# Patient Record
Sex: Female | Born: 1960
Health system: Southern US, Community
[De-identification: ages and names within clinical notes are randomized; demographics above are authoritative.]

## PROBLEM LIST (undated history)

## (undated) DIAGNOSIS — R000 Tachycardia, unspecified: Secondary | ICD-10-CM

## (undated) DIAGNOSIS — N390 Urinary tract infection, site not specified: Secondary | ICD-10-CM

## (undated) DIAGNOSIS — I83813 Varicose veins of bilateral lower extremities with pain: Secondary | ICD-10-CM

## (undated) DIAGNOSIS — J45909 Unspecified asthma, uncomplicated: Secondary | ICD-10-CM

## (undated) DIAGNOSIS — D649 Anemia, unspecified: Secondary | ICD-10-CM

## (undated) DIAGNOSIS — M8580 Other specified disorders of bone density and structure, unspecified site: Secondary | ICD-10-CM

## (undated) DIAGNOSIS — M069 Rheumatoid arthritis, unspecified: Secondary | ICD-10-CM

## (undated) DIAGNOSIS — K529 Noninfective gastroenteritis and colitis, unspecified: Secondary | ICD-10-CM

## (undated) DIAGNOSIS — K219 Gastro-esophageal reflux disease without esophagitis: Secondary | ICD-10-CM

## (undated) DIAGNOSIS — K589 Irritable bowel syndrome without diarrhea: Secondary | ICD-10-CM

## (undated) DIAGNOSIS — T7840XA Allergy, unspecified, initial encounter: Secondary | ICD-10-CM

## (undated) DIAGNOSIS — F419 Anxiety disorder, unspecified: Secondary | ICD-10-CM

## (undated) DIAGNOSIS — M797 Fibromyalgia: Secondary | ICD-10-CM

## (undated) DIAGNOSIS — N3010 Interstitial cystitis (chronic) without hematuria: Secondary | ICD-10-CM

## (undated) DIAGNOSIS — R569 Unspecified convulsions: Secondary | ICD-10-CM

## (undated) DIAGNOSIS — F410 Panic disorder [episodic paroxysmal anxiety] without agoraphobia: Secondary | ICD-10-CM

## (undated) HISTORY — DX: Tachycardia, unspecified: R00.0

## (undated) HISTORY — DX: Panic disorder (episodic paroxysmal anxiety): F41.0

## (undated) HISTORY — DX: Unspecified convulsions: R56.9

## (undated) HISTORY — DX: Allergy, unspecified, initial encounter: T78.40XA

## (undated) HISTORY — DX: Varicose veins of bilateral lower extremities with pain: I83.813

## (undated) HISTORY — DX: Other specified disorders of bone density and structure, unspecified site: M85.80

## (undated) HISTORY — DX: Irritable bowel syndrome, unspecified: K58.9

## (undated) HISTORY — DX: Unspecified asthma, uncomplicated: J45.909

## (undated) HISTORY — DX: Interstitial cystitis (chronic) without hematuria: N30.10

## (undated) HISTORY — DX: Urinary tract infection, site not specified: N39.0

## (undated) HISTORY — DX: Rheumatoid arthritis, unspecified: M06.9

## (undated) HISTORY — DX: Noninfective gastroenteritis and colitis, unspecified: K52.9

## (undated) HISTORY — PX: TUBAL LIGATION: SHX77

## (undated) HISTORY — DX: Fibromyalgia: M79.7

## (undated) HISTORY — DX: Anxiety disorder, unspecified: F41.9

## (undated) HISTORY — DX: Gastro-esophageal reflux disease without esophagitis: K21.9

## (undated) HISTORY — DX: Anemia, unspecified: D64.9

---

## 1993-05-24 HISTORY — PX: COLONOSCOPY: SHX174

## 2010-11-17 DIAGNOSIS — F39 Unspecified mood [affective] disorder: Secondary | ICD-10-CM | POA: Insufficient documentation

## 2010-11-17 DIAGNOSIS — F411 Generalized anxiety disorder: Secondary | ICD-10-CM | POA: Insufficient documentation

## 2015-12-25 DIAGNOSIS — R002 Palpitations: Secondary | ICD-10-CM | POA: Insufficient documentation

## 2015-12-25 DIAGNOSIS — D649 Anemia, unspecified: Secondary | ICD-10-CM | POA: Insufficient documentation

## 2016-03-08 DIAGNOSIS — F419 Anxiety disorder, unspecified: Secondary | ICD-10-CM | POA: Insufficient documentation

## 2016-04-26 HISTORY — PX: LAPAROSCOPIC HYSTERECTOMY: SHX1926

## 2016-04-26 HISTORY — PX: ABDOMINAL HYSTERECTOMY: SHX81

## 2016-05-04 ENCOUNTER — Encounter: Payer: Self-pay | Admitting: Vascular Surgery

## 2016-05-14 ENCOUNTER — Encounter: Payer: Self-pay | Admitting: Vascular Surgery

## 2016-05-31 ENCOUNTER — Encounter: Payer: Self-pay | Admitting: Vascular Surgery

## 2016-06-08 DIAGNOSIS — F410 Panic disorder [episodic paroxysmal anxiety] without agoraphobia: Secondary | ICD-10-CM | POA: Diagnosis not present

## 2016-06-23 DIAGNOSIS — R928 Other abnormal and inconclusive findings on diagnostic imaging of breast: Secondary | ICD-10-CM | POA: Diagnosis not present

## 2016-06-23 DIAGNOSIS — R922 Inconclusive mammogram: Secondary | ICD-10-CM | POA: Diagnosis not present

## 2016-06-27 DIAGNOSIS — R112 Nausea with vomiting, unspecified: Secondary | ICD-10-CM | POA: Diagnosis not present

## 2016-06-27 DIAGNOSIS — K589 Irritable bowel syndrome without diarrhea: Secondary | ICD-10-CM | POA: Diagnosis not present

## 2016-06-27 DIAGNOSIS — E872 Acidosis: Secondary | ICD-10-CM | POA: Diagnosis not present

## 2016-06-27 DIAGNOSIS — D61818 Other pancytopenia: Secondary | ICD-10-CM | POA: Diagnosis not present

## 2016-06-27 DIAGNOSIS — F419 Anxiety disorder, unspecified: Secondary | ICD-10-CM | POA: Diagnosis not present

## 2016-06-27 DIAGNOSIS — R000 Tachycardia, unspecified: Secondary | ICD-10-CM | POA: Diagnosis not present

## 2016-06-27 DIAGNOSIS — F411 Generalized anxiety disorder: Secondary | ICD-10-CM | POA: Diagnosis present

## 2016-06-27 DIAGNOSIS — K529 Noninfective gastroenteritis and colitis, unspecified: Secondary | ICD-10-CM | POA: Diagnosis not present

## 2016-06-27 DIAGNOSIS — K625 Hemorrhage of anus and rectum: Secondary | ICD-10-CM | POA: Diagnosis not present

## 2016-06-27 DIAGNOSIS — A419 Sepsis, unspecified organism: Secondary | ICD-10-CM | POA: Diagnosis not present

## 2016-06-27 DIAGNOSIS — Z79899 Other long term (current) drug therapy: Secondary | ICD-10-CM | POA: Diagnosis not present

## 2016-06-27 DIAGNOSIS — R1084 Generalized abdominal pain: Secondary | ICD-10-CM | POA: Diagnosis not present

## 2016-06-27 DIAGNOSIS — R109 Unspecified abdominal pain: Secondary | ICD-10-CM | POA: Diagnosis not present

## 2016-06-28 DIAGNOSIS — K529 Noninfective gastroenteritis and colitis, unspecified: Secondary | ICD-10-CM | POA: Diagnosis not present

## 2016-06-28 DIAGNOSIS — R112 Nausea with vomiting, unspecified: Secondary | ICD-10-CM | POA: Diagnosis not present

## 2016-06-28 DIAGNOSIS — F419 Anxiety disorder, unspecified: Secondary | ICD-10-CM | POA: Diagnosis not present

## 2016-06-29 DIAGNOSIS — R112 Nausea with vomiting, unspecified: Secondary | ICD-10-CM | POA: Diagnosis not present

## 2016-06-29 DIAGNOSIS — F419 Anxiety disorder, unspecified: Secondary | ICD-10-CM | POA: Diagnosis not present

## 2016-06-29 DIAGNOSIS — K529 Noninfective gastroenteritis and colitis, unspecified: Secondary | ICD-10-CM | POA: Diagnosis not present

## 2016-06-30 DIAGNOSIS — K529 Noninfective gastroenteritis and colitis, unspecified: Secondary | ICD-10-CM | POA: Diagnosis not present

## 2016-06-30 DIAGNOSIS — R112 Nausea with vomiting, unspecified: Secondary | ICD-10-CM | POA: Diagnosis not present

## 2016-06-30 DIAGNOSIS — F419 Anxiety disorder, unspecified: Secondary | ICD-10-CM | POA: Diagnosis not present

## 2016-07-05 DIAGNOSIS — I951 Orthostatic hypotension: Secondary | ICD-10-CM | POA: Diagnosis not present

## 2016-07-05 DIAGNOSIS — A09 Infectious gastroenteritis and colitis, unspecified: Secondary | ICD-10-CM | POA: Diagnosis not present

## 2016-07-05 DIAGNOSIS — R8299 Other abnormal findings in urine: Secondary | ICD-10-CM | POA: Diagnosis not present

## 2016-07-06 DIAGNOSIS — R1031 Right lower quadrant pain: Secondary | ICD-10-CM | POA: Diagnosis not present

## 2016-07-06 DIAGNOSIS — K529 Noninfective gastroenteritis and colitis, unspecified: Secondary | ICD-10-CM | POA: Diagnosis not present

## 2016-07-21 DIAGNOSIS — R1010 Upper abdominal pain, unspecified: Secondary | ICD-10-CM | POA: Diagnosis not present

## 2016-07-21 DIAGNOSIS — K5909 Other constipation: Secondary | ICD-10-CM | POA: Diagnosis not present

## 2016-07-21 DIAGNOSIS — K529 Noninfective gastroenteritis and colitis, unspecified: Secondary | ICD-10-CM | POA: Diagnosis not present

## 2016-07-27 ENCOUNTER — Encounter: Payer: Self-pay | Admitting: Vascular Surgery

## 2016-07-27 DIAGNOSIS — M5432 Sciatica, left side: Secondary | ICD-10-CM | POA: Diagnosis not present

## 2016-07-27 DIAGNOSIS — K921 Melena: Secondary | ICD-10-CM | POA: Diagnosis not present

## 2016-07-27 DIAGNOSIS — R0789 Other chest pain: Secondary | ICD-10-CM | POA: Diagnosis not present

## 2016-07-27 DIAGNOSIS — R1010 Upper abdominal pain, unspecified: Secondary | ICD-10-CM | POA: Diagnosis not present

## 2016-07-27 DIAGNOSIS — R350 Frequency of micturition: Secondary | ICD-10-CM | POA: Diagnosis not present

## 2016-07-27 DIAGNOSIS — M791 Myalgia: Secondary | ICD-10-CM | POA: Diagnosis not present

## 2016-07-27 DIAGNOSIS — R799 Abnormal finding of blood chemistry, unspecified: Secondary | ICD-10-CM | POA: Diagnosis not present

## 2016-08-02 DIAGNOSIS — R799 Abnormal finding of blood chemistry, unspecified: Secondary | ICD-10-CM | POA: Diagnosis not present

## 2016-08-02 DIAGNOSIS — R760 Raised antibody titer: Secondary | ICD-10-CM | POA: Diagnosis not present

## 2016-08-02 DIAGNOSIS — M791 Myalgia: Secondary | ICD-10-CM | POA: Diagnosis not present

## 2016-08-05 ENCOUNTER — Encounter: Payer: Self-pay | Admitting: Vascular Surgery

## 2016-08-17 ENCOUNTER — Encounter: Payer: Medicare Other | Admitting: Vascular Surgery

## 2016-08-25 DIAGNOSIS — R079 Chest pain, unspecified: Secondary | ICD-10-CM | POA: Diagnosis not present

## 2016-08-25 DIAGNOSIS — R0602 Shortness of breath: Secondary | ICD-10-CM | POA: Diagnosis not present

## 2016-08-26 DIAGNOSIS — R079 Chest pain, unspecified: Secondary | ICD-10-CM | POA: Diagnosis not present

## 2016-09-07 DIAGNOSIS — R0602 Shortness of breath: Secondary | ICD-10-CM | POA: Diagnosis not present

## 2016-09-07 DIAGNOSIS — M791 Myalgia: Secondary | ICD-10-CM | POA: Diagnosis not present

## 2016-09-07 DIAGNOSIS — F329 Major depressive disorder, single episode, unspecified: Secondary | ICD-10-CM | POA: Diagnosis not present

## 2016-09-07 DIAGNOSIS — F419 Anxiety disorder, unspecified: Secondary | ICD-10-CM | POA: Diagnosis not present

## 2016-09-13 DIAGNOSIS — M5432 Sciatica, left side: Secondary | ICD-10-CM | POA: Diagnosis not present

## 2016-09-14 DIAGNOSIS — R05 Cough: Secondary | ICD-10-CM | POA: Diagnosis not present

## 2016-09-14 DIAGNOSIS — R0602 Shortness of breath: Secondary | ICD-10-CM | POA: Diagnosis not present

## 2016-09-29 DIAGNOSIS — Z1272 Encounter for screening for malignant neoplasm of vagina: Secondary | ICD-10-CM | POA: Diagnosis not present

## 2016-09-29 DIAGNOSIS — R102 Pelvic and perineal pain: Secondary | ICD-10-CM | POA: Diagnosis not present

## 2016-09-29 DIAGNOSIS — Z01411 Encounter for gynecological examination (general) (routine) with abnormal findings: Secondary | ICD-10-CM | POA: Diagnosis not present

## 2016-09-29 DIAGNOSIS — R8299 Other abnormal findings in urine: Secondary | ICD-10-CM | POA: Diagnosis not present

## 2016-10-06 DIAGNOSIS — J301 Allergic rhinitis due to pollen: Secondary | ICD-10-CM | POA: Diagnosis not present

## 2016-10-06 DIAGNOSIS — R5383 Other fatigue: Secondary | ICD-10-CM | POA: Diagnosis not present

## 2016-10-06 DIAGNOSIS — E559 Vitamin D deficiency, unspecified: Secondary | ICD-10-CM | POA: Diagnosis not present

## 2016-10-06 DIAGNOSIS — G4733 Obstructive sleep apnea (adult) (pediatric): Secondary | ICD-10-CM | POA: Diagnosis not present

## 2016-10-06 DIAGNOSIS — J4540 Moderate persistent asthma, uncomplicated: Secondary | ICD-10-CM | POA: Diagnosis not present

## 2016-10-18 DIAGNOSIS — I517 Cardiomegaly: Secondary | ICD-10-CM | POA: Diagnosis not present

## 2016-10-18 DIAGNOSIS — R51 Headache: Secondary | ICD-10-CM | POA: Diagnosis not present

## 2016-10-19 DIAGNOSIS — B86 Scabies: Secondary | ICD-10-CM | POA: Diagnosis not present

## 2016-10-19 DIAGNOSIS — N760 Acute vaginitis: Secondary | ICD-10-CM | POA: Diagnosis not present

## 2016-11-01 DIAGNOSIS — R102 Pelvic and perineal pain: Secondary | ICD-10-CM | POA: Diagnosis not present

## 2016-12-02 DIAGNOSIS — R8299 Other abnormal findings in urine: Secondary | ICD-10-CM | POA: Diagnosis not present

## 2016-12-13 DIAGNOSIS — Z1389 Encounter for screening for other disorder: Secondary | ICD-10-CM | POA: Diagnosis not present

## 2016-12-13 DIAGNOSIS — F3289 Other specified depressive episodes: Secondary | ICD-10-CM | POA: Diagnosis not present

## 2016-12-13 DIAGNOSIS — R1084 Generalized abdominal pain: Secondary | ICD-10-CM | POA: Diagnosis not present

## 2016-12-13 DIAGNOSIS — F319 Bipolar disorder, unspecified: Secondary | ICD-10-CM | POA: Diagnosis not present

## 2016-12-13 DIAGNOSIS — Z136 Encounter for screening for cardiovascular disorders: Secondary | ICD-10-CM | POA: Diagnosis not present

## 2016-12-13 DIAGNOSIS — F3160 Bipolar disorder, current episode mixed, unspecified: Secondary | ICD-10-CM | POA: Diagnosis not present

## 2016-12-13 DIAGNOSIS — F339 Major depressive disorder, recurrent, unspecified: Secondary | ICD-10-CM | POA: Diagnosis not present

## 2016-12-13 DIAGNOSIS — F419 Anxiety disorder, unspecified: Secondary | ICD-10-CM | POA: Diagnosis not present

## 2016-12-16 DIAGNOSIS — R799 Abnormal finding of blood chemistry, unspecified: Secondary | ICD-10-CM | POA: Diagnosis not present

## 2017-01-04 DIAGNOSIS — Z1211 Encounter for screening for malignant neoplasm of colon: Secondary | ICD-10-CM | POA: Diagnosis not present

## 2017-01-04 DIAGNOSIS — R1030 Lower abdominal pain, unspecified: Secondary | ICD-10-CM | POA: Diagnosis not present

## 2017-01-04 DIAGNOSIS — K589 Irritable bowel syndrome without diarrhea: Secondary | ICD-10-CM | POA: Diagnosis not present

## 2017-01-14 DIAGNOSIS — H5710 Ocular pain, unspecified eye: Secondary | ICD-10-CM | POA: Diagnosis not present

## 2017-01-20 DIAGNOSIS — R1010 Upper abdominal pain, unspecified: Secondary | ICD-10-CM | POA: Diagnosis not present

## 2017-01-20 DIAGNOSIS — R1030 Lower abdominal pain, unspecified: Secondary | ICD-10-CM | POA: Diagnosis not present

## 2017-01-25 DIAGNOSIS — K529 Noninfective gastroenteritis and colitis, unspecified: Secondary | ICD-10-CM | POA: Diagnosis not present

## 2017-01-25 DIAGNOSIS — R197 Diarrhea, unspecified: Secondary | ICD-10-CM | POA: Diagnosis not present

## 2017-01-25 DIAGNOSIS — K589 Irritable bowel syndrome without diarrhea: Secondary | ICD-10-CM | POA: Diagnosis not present

## 2017-01-26 DIAGNOSIS — R197 Diarrhea, unspecified: Secondary | ICD-10-CM | POA: Diagnosis not present

## 2017-02-24 DIAGNOSIS — N3000 Acute cystitis without hematuria: Secondary | ICD-10-CM | POA: Diagnosis not present

## 2017-02-24 DIAGNOSIS — K760 Fatty (change of) liver, not elsewhere classified: Secondary | ICD-10-CM | POA: Diagnosis not present

## 2017-02-24 DIAGNOSIS — R1032 Left lower quadrant pain: Secondary | ICD-10-CM | POA: Diagnosis not present

## 2017-03-15 DIAGNOSIS — Z1159 Encounter for screening for other viral diseases: Secondary | ICD-10-CM | POA: Diagnosis not present

## 2017-03-15 DIAGNOSIS — R079 Chest pain, unspecified: Secondary | ICD-10-CM | POA: Diagnosis not present

## 2017-03-15 DIAGNOSIS — R000 Tachycardia, unspecified: Secondary | ICD-10-CM | POA: Diagnosis not present

## 2017-03-15 DIAGNOSIS — A09 Infectious gastroenteritis and colitis, unspecified: Secondary | ICD-10-CM | POA: Diagnosis not present

## 2017-03-15 DIAGNOSIS — R51 Headache: Secondary | ICD-10-CM | POA: Diagnosis not present

## 2017-03-15 DIAGNOSIS — M791 Myalgia, unspecified site: Secondary | ICD-10-CM | POA: Diagnosis not present

## 2017-03-15 DIAGNOSIS — Z0000 Encounter for general adult medical examination without abnormal findings: Secondary | ICD-10-CM | POA: Diagnosis not present

## 2017-03-15 DIAGNOSIS — R828 Abnormal findings on cytological and histological examination of urine: Secondary | ICD-10-CM | POA: Diagnosis not present

## 2017-03-15 DIAGNOSIS — R300 Dysuria: Secondary | ICD-10-CM | POA: Diagnosis not present

## 2017-03-15 DIAGNOSIS — R5383 Other fatigue: Secondary | ICD-10-CM | POA: Diagnosis not present

## 2017-03-15 DIAGNOSIS — R002 Palpitations: Secondary | ICD-10-CM | POA: Diagnosis not present

## 2017-03-15 DIAGNOSIS — R0602 Shortness of breath: Secondary | ICD-10-CM | POA: Diagnosis not present

## 2017-03-15 DIAGNOSIS — R1010 Upper abdominal pain, unspecified: Secondary | ICD-10-CM | POA: Diagnosis not present

## 2017-03-15 DIAGNOSIS — F419 Anxiety disorder, unspecified: Secondary | ICD-10-CM | POA: Diagnosis not present

## 2017-03-28 DIAGNOSIS — F419 Anxiety disorder, unspecified: Secondary | ICD-10-CM | POA: Diagnosis not present

## 2017-03-28 DIAGNOSIS — A419 Sepsis, unspecified organism: Secondary | ICD-10-CM | POA: Diagnosis not present

## 2017-03-28 DIAGNOSIS — R109 Unspecified abdominal pain: Secondary | ICD-10-CM | POA: Diagnosis not present

## 2017-03-28 DIAGNOSIS — R0602 Shortness of breath: Secondary | ICD-10-CM | POA: Diagnosis not present

## 2017-03-28 DIAGNOSIS — R404 Transient alteration of awareness: Secondary | ICD-10-CM | POA: Diagnosis not present

## 2017-03-28 DIAGNOSIS — R064 Hyperventilation: Secondary | ICD-10-CM | POA: Diagnosis not present

## 2017-03-28 DIAGNOSIS — F418 Other specified anxiety disorders: Secondary | ICD-10-CM | POA: Diagnosis not present

## 2017-03-28 DIAGNOSIS — R531 Weakness: Secondary | ICD-10-CM | POA: Diagnosis not present

## 2017-04-20 DIAGNOSIS — R0602 Shortness of breath: Secondary | ICD-10-CM | POA: Diagnosis not present

## 2017-04-20 DIAGNOSIS — Z1379 Encounter for other screening for genetic and chromosomal anomalies: Secondary | ICD-10-CM | POA: Diagnosis not present

## 2017-04-20 DIAGNOSIS — Z8041 Family history of malignant neoplasm of ovary: Secondary | ICD-10-CM | POA: Diagnosis not present

## 2017-04-20 DIAGNOSIS — R000 Tachycardia, unspecified: Secondary | ICD-10-CM | POA: Diagnosis not present

## 2017-04-20 DIAGNOSIS — D219 Benign neoplasm of connective and other soft tissue, unspecified: Secondary | ICD-10-CM | POA: Diagnosis not present

## 2017-04-20 DIAGNOSIS — R5383 Other fatigue: Secondary | ICD-10-CM | POA: Diagnosis not present

## 2017-04-20 DIAGNOSIS — D649 Anemia, unspecified: Secondary | ICD-10-CM | POA: Diagnosis not present

## 2017-04-20 DIAGNOSIS — F419 Anxiety disorder, unspecified: Secondary | ICD-10-CM | POA: Diagnosis not present

## 2017-04-20 DIAGNOSIS — D509 Iron deficiency anemia, unspecified: Secondary | ICD-10-CM | POA: Diagnosis not present

## 2017-04-20 DIAGNOSIS — Z800 Family history of malignant neoplasm of digestive organs: Secondary | ICD-10-CM | POA: Diagnosis not present

## 2017-04-20 DIAGNOSIS — Z808 Family history of malignant neoplasm of other organs or systems: Secondary | ICD-10-CM | POA: Diagnosis not present

## 2017-04-20 DIAGNOSIS — Z1509 Genetic susceptibility to other malignant neoplasm: Secondary | ICD-10-CM | POA: Diagnosis not present

## 2017-04-28 DIAGNOSIS — Z1211 Encounter for screening for malignant neoplasm of colon: Secondary | ICD-10-CM | POA: Diagnosis not present

## 2017-04-28 DIAGNOSIS — Z1212 Encounter for screening for malignant neoplasm of rectum: Secondary | ICD-10-CM | POA: Diagnosis not present

## 2017-05-10 DIAGNOSIS — R0602 Shortness of breath: Secondary | ICD-10-CM | POA: Diagnosis not present

## 2017-05-10 DIAGNOSIS — D649 Anemia, unspecified: Secondary | ICD-10-CM | POA: Diagnosis not present

## 2017-05-10 DIAGNOSIS — R439 Unspecified disturbances of smell and taste: Secondary | ICD-10-CM | POA: Diagnosis not present

## 2017-05-10 DIAGNOSIS — R5383 Other fatigue: Secondary | ICD-10-CM | POA: Diagnosis not present

## 2017-05-10 DIAGNOSIS — D519 Vitamin B12 deficiency anemia, unspecified: Secondary | ICD-10-CM | POA: Diagnosis not present

## 2017-05-12 DIAGNOSIS — F419 Anxiety disorder, unspecified: Secondary | ICD-10-CM | POA: Diagnosis not present

## 2017-05-12 DIAGNOSIS — R079 Chest pain, unspecified: Secondary | ICD-10-CM | POA: Diagnosis not present

## 2017-05-12 DIAGNOSIS — R072 Precordial pain: Secondary | ICD-10-CM | POA: Diagnosis not present

## 2017-05-12 DIAGNOSIS — R000 Tachycardia, unspecified: Secondary | ICD-10-CM | POA: Diagnosis not present

## 2017-05-19 DIAGNOSIS — R002 Palpitations: Secondary | ICD-10-CM | POA: Diagnosis not present

## 2017-05-19 DIAGNOSIS — R000 Tachycardia, unspecified: Secondary | ICD-10-CM | POA: Diagnosis not present

## 2017-05-19 DIAGNOSIS — I517 Cardiomegaly: Secondary | ICD-10-CM | POA: Diagnosis not present

## 2017-06-05 DIAGNOSIS — F410 Panic disorder [episodic paroxysmal anxiety] without agoraphobia: Secondary | ICD-10-CM | POA: Diagnosis not present

## 2017-06-05 DIAGNOSIS — R0600 Dyspnea, unspecified: Secondary | ICD-10-CM | POA: Diagnosis not present

## 2017-06-05 DIAGNOSIS — R0682 Tachypnea, not elsewhere classified: Secondary | ICD-10-CM | POA: Diagnosis not present

## 2017-06-05 DIAGNOSIS — M79602 Pain in left arm: Secondary | ICD-10-CM | POA: Diagnosis not present

## 2017-06-05 DIAGNOSIS — R51 Headache: Secondary | ICD-10-CM | POA: Diagnosis not present

## 2017-06-08 DIAGNOSIS — M25512 Pain in left shoulder: Secondary | ICD-10-CM | POA: Diagnosis not present

## 2017-06-08 DIAGNOSIS — M542 Cervicalgia: Secondary | ICD-10-CM | POA: Diagnosis not present

## 2017-06-08 DIAGNOSIS — R0600 Dyspnea, unspecified: Secondary | ICD-10-CM | POA: Diagnosis not present

## 2017-06-10 DIAGNOSIS — M542 Cervicalgia: Secondary | ICD-10-CM | POA: Diagnosis not present

## 2017-06-10 DIAGNOSIS — R0602 Shortness of breath: Secondary | ICD-10-CM | POA: Diagnosis not present

## 2017-06-10 DIAGNOSIS — M47812 Spondylosis without myelopathy or radiculopathy, cervical region: Secondary | ICD-10-CM | POA: Diagnosis not present

## 2017-06-10 DIAGNOSIS — R791 Abnormal coagulation profile: Secondary | ICD-10-CM | POA: Diagnosis not present

## 2017-06-10 DIAGNOSIS — M25512 Pain in left shoulder: Secondary | ICD-10-CM | POA: Diagnosis not present

## 2017-06-16 DIAGNOSIS — R439 Unspecified disturbances of smell and taste: Secondary | ICD-10-CM | POA: Diagnosis not present

## 2017-06-16 DIAGNOSIS — R51 Headache: Secondary | ICD-10-CM | POA: Diagnosis not present

## 2017-06-21 DIAGNOSIS — R439 Unspecified disturbances of smell and taste: Secondary | ICD-10-CM | POA: Diagnosis not present

## 2017-06-22 DIAGNOSIS — M79602 Pain in left arm: Secondary | ICD-10-CM | POA: Diagnosis not present

## 2017-06-22 DIAGNOSIS — R51 Headache: Secondary | ICD-10-CM | POA: Diagnosis not present

## 2017-06-22 DIAGNOSIS — E860 Dehydration: Secondary | ICD-10-CM | POA: Diagnosis not present

## 2017-06-22 DIAGNOSIS — F419 Anxiety disorder, unspecified: Secondary | ICD-10-CM | POA: Diagnosis not present

## 2017-06-22 DIAGNOSIS — M542 Cervicalgia: Secondary | ICD-10-CM | POA: Diagnosis not present

## 2017-06-22 DIAGNOSIS — R200 Anesthesia of skin: Secondary | ICD-10-CM | POA: Diagnosis not present

## 2017-06-22 DIAGNOSIS — M5410 Radiculopathy, site unspecified: Secondary | ICD-10-CM | POA: Diagnosis not present

## 2017-06-22 DIAGNOSIS — R079 Chest pain, unspecified: Secondary | ICD-10-CM | POA: Diagnosis not present

## 2017-06-22 DIAGNOSIS — I6789 Other cerebrovascular disease: Secondary | ICD-10-CM | POA: Diagnosis not present

## 2017-06-22 DIAGNOSIS — R0789 Other chest pain: Secondary | ICD-10-CM | POA: Diagnosis not present

## 2017-06-24 DIAGNOSIS — M542 Cervicalgia: Secondary | ICD-10-CM | POA: Diagnosis not present

## 2017-06-24 DIAGNOSIS — M62522 Muscle wasting and atrophy, not elsewhere classified, left upper arm: Secondary | ICD-10-CM | POA: Diagnosis not present

## 2017-06-24 DIAGNOSIS — M79622 Pain in left upper arm: Secondary | ICD-10-CM | POA: Diagnosis not present

## 2017-06-28 DIAGNOSIS — Z1231 Encounter for screening mammogram for malignant neoplasm of breast: Secondary | ICD-10-CM | POA: Diagnosis not present

## 2017-06-28 DIAGNOSIS — R439 Unspecified disturbances of smell and taste: Secondary | ICD-10-CM | POA: Diagnosis not present

## 2017-07-01 DIAGNOSIS — M62522 Muscle wasting and atrophy, not elsewhere classified, left upper arm: Secondary | ICD-10-CM | POA: Diagnosis not present

## 2017-07-01 DIAGNOSIS — M79622 Pain in left upper arm: Secondary | ICD-10-CM | POA: Diagnosis not present

## 2017-07-01 DIAGNOSIS — M542 Cervicalgia: Secondary | ICD-10-CM | POA: Diagnosis not present

## 2017-07-04 DIAGNOSIS — M542 Cervicalgia: Secondary | ICD-10-CM | POA: Diagnosis not present

## 2017-07-04 DIAGNOSIS — F419 Anxiety disorder, unspecified: Secondary | ICD-10-CM | POA: Diagnosis not present

## 2017-07-04 DIAGNOSIS — R300 Dysuria: Secondary | ICD-10-CM | POA: Diagnosis not present

## 2017-07-27 ENCOUNTER — Encounter: Payer: Self-pay | Admitting: Gastroenterology

## 2017-07-27 DIAGNOSIS — K5909 Other constipation: Secondary | ICD-10-CM | POA: Diagnosis not present

## 2017-07-27 DIAGNOSIS — R110 Nausea: Secondary | ICD-10-CM | POA: Diagnosis not present

## 2017-07-27 DIAGNOSIS — K5900 Constipation, unspecified: Secondary | ICD-10-CM | POA: Diagnosis not present

## 2017-07-27 DIAGNOSIS — R1084 Generalized abdominal pain: Secondary | ICD-10-CM | POA: Diagnosis not present

## 2017-07-27 DIAGNOSIS — K2970 Gastritis, unspecified, without bleeding: Secondary | ICD-10-CM | POA: Diagnosis not present

## 2017-07-29 ENCOUNTER — Encounter: Payer: Self-pay | Admitting: Gastroenterology

## 2017-07-29 DIAGNOSIS — R1084 Generalized abdominal pain: Secondary | ICD-10-CM | POA: Diagnosis not present

## 2017-07-29 DIAGNOSIS — K5900 Constipation, unspecified: Secondary | ICD-10-CM | POA: Diagnosis not present

## 2017-07-29 DIAGNOSIS — R109 Unspecified abdominal pain: Secondary | ICD-10-CM | POA: Diagnosis not present

## 2017-07-29 DIAGNOSIS — F419 Anxiety disorder, unspecified: Secondary | ICD-10-CM | POA: Diagnosis not present

## 2017-08-03 DIAGNOSIS — R0682 Tachypnea, not elsewhere classified: Secondary | ICD-10-CM | POA: Diagnosis not present

## 2017-08-03 DIAGNOSIS — R52 Pain, unspecified: Secondary | ICD-10-CM | POA: Diagnosis not present

## 2017-08-09 DIAGNOSIS — Z2821 Immunization not carried out because of patient refusal: Secondary | ICD-10-CM | POA: Diagnosis not present

## 2017-08-09 DIAGNOSIS — Z1331 Encounter for screening for depression: Secondary | ICD-10-CM | POA: Diagnosis not present

## 2017-08-09 DIAGNOSIS — F419 Anxiety disorder, unspecified: Secondary | ICD-10-CM | POA: Diagnosis not present

## 2017-08-09 DIAGNOSIS — Z833 Family history of diabetes mellitus: Secondary | ICD-10-CM | POA: Diagnosis not present

## 2017-08-09 DIAGNOSIS — M8589 Other specified disorders of bone density and structure, multiple sites: Secondary | ICD-10-CM | POA: Diagnosis not present

## 2017-08-09 DIAGNOSIS — K219 Gastro-esophageal reflux disease without esophagitis: Secondary | ICD-10-CM | POA: Diagnosis not present

## 2017-08-09 DIAGNOSIS — Z23 Encounter for immunization: Secondary | ICD-10-CM | POA: Diagnosis not present

## 2017-08-09 DIAGNOSIS — Z79899 Other long term (current) drug therapy: Secondary | ICD-10-CM | POA: Diagnosis not present

## 2017-08-09 DIAGNOSIS — E559 Vitamin D deficiency, unspecified: Secondary | ICD-10-CM | POA: Diagnosis not present

## 2017-08-09 DIAGNOSIS — Z6825 Body mass index (BMI) 25.0-25.9, adult: Secondary | ICD-10-CM | POA: Diagnosis not present

## 2017-08-17 DIAGNOSIS — M62522 Muscle wasting and atrophy, not elsewhere classified, left upper arm: Secondary | ICD-10-CM | POA: Diagnosis not present

## 2017-08-17 DIAGNOSIS — M79622 Pain in left upper arm: Secondary | ICD-10-CM | POA: Diagnosis not present

## 2017-08-17 DIAGNOSIS — M542 Cervicalgia: Secondary | ICD-10-CM | POA: Diagnosis not present

## 2017-08-22 ENCOUNTER — Encounter: Payer: Self-pay | Admitting: Gastroenterology

## 2017-08-22 DIAGNOSIS — R1010 Upper abdominal pain, unspecified: Secondary | ICD-10-CM | POA: Diagnosis not present

## 2017-08-22 DIAGNOSIS — R1310 Dysphagia, unspecified: Secondary | ICD-10-CM | POA: Diagnosis not present

## 2017-08-24 DIAGNOSIS — M542 Cervicalgia: Secondary | ICD-10-CM | POA: Diagnosis not present

## 2017-08-24 DIAGNOSIS — M79622 Pain in left upper arm: Secondary | ICD-10-CM | POA: Diagnosis not present

## 2017-08-24 DIAGNOSIS — M62522 Muscle wasting and atrophy, not elsewhere classified, left upper arm: Secondary | ICD-10-CM | POA: Diagnosis not present

## 2017-08-26 DIAGNOSIS — F410 Panic disorder [episodic paroxysmal anxiety] without agoraphobia: Secondary | ICD-10-CM | POA: Diagnosis not present

## 2017-08-26 DIAGNOSIS — F411 Generalized anxiety disorder: Secondary | ICD-10-CM | POA: Diagnosis not present

## 2017-08-26 DIAGNOSIS — F419 Anxiety disorder, unspecified: Secondary | ICD-10-CM | POA: Diagnosis not present

## 2017-08-26 DIAGNOSIS — F430 Acute stress reaction: Secondary | ICD-10-CM | POA: Diagnosis not present

## 2017-08-26 DIAGNOSIS — R0600 Dyspnea, unspecified: Secondary | ICD-10-CM | POA: Diagnosis not present

## 2017-08-26 DIAGNOSIS — N390 Urinary tract infection, site not specified: Secondary | ICD-10-CM | POA: Diagnosis not present

## 2017-08-26 DIAGNOSIS — R000 Tachycardia, unspecified: Secondary | ICD-10-CM | POA: Diagnosis not present

## 2017-08-26 DIAGNOSIS — R109 Unspecified abdominal pain: Secondary | ICD-10-CM | POA: Diagnosis not present

## 2017-08-26 DIAGNOSIS — F439 Reaction to severe stress, unspecified: Secondary | ICD-10-CM | POA: Diagnosis not present

## 2017-08-30 ENCOUNTER — Encounter: Payer: Self-pay | Admitting: Gastroenterology

## 2017-08-30 DIAGNOSIS — R1310 Dysphagia, unspecified: Secondary | ICD-10-CM | POA: Diagnosis not present

## 2017-08-30 DIAGNOSIS — K589 Irritable bowel syndrome without diarrhea: Secondary | ICD-10-CM | POA: Diagnosis not present

## 2017-08-30 DIAGNOSIS — R109 Unspecified abdominal pain: Secondary | ICD-10-CM | POA: Diagnosis not present

## 2017-08-31 DIAGNOSIS — M62522 Muscle wasting and atrophy, not elsewhere classified, left upper arm: Secondary | ICD-10-CM | POA: Diagnosis not present

## 2017-08-31 DIAGNOSIS — M542 Cervicalgia: Secondary | ICD-10-CM | POA: Diagnosis not present

## 2017-08-31 DIAGNOSIS — M79622 Pain in left upper arm: Secondary | ICD-10-CM | POA: Diagnosis not present

## 2017-09-06 DIAGNOSIS — R0989 Other specified symptoms and signs involving the circulatory and respiratory systems: Secondary | ICD-10-CM | POA: Diagnosis not present

## 2017-09-06 DIAGNOSIS — F419 Anxiety disorder, unspecified: Secondary | ICD-10-CM | POA: Diagnosis not present

## 2017-09-06 DIAGNOSIS — R1310 Dysphagia, unspecified: Secondary | ICD-10-CM | POA: Diagnosis not present

## 2017-09-06 DIAGNOSIS — R109 Unspecified abdominal pain: Secondary | ICD-10-CM | POA: Diagnosis not present

## 2017-09-06 DIAGNOSIS — K219 Gastro-esophageal reflux disease without esophagitis: Secondary | ICD-10-CM | POA: Diagnosis not present

## 2017-09-06 DIAGNOSIS — Z8709 Personal history of other diseases of the respiratory system: Secondary | ICD-10-CM | POA: Diagnosis not present

## 2017-09-07 DIAGNOSIS — Z136 Encounter for screening for cardiovascular disorders: Secondary | ICD-10-CM | POA: Diagnosis not present

## 2017-09-07 DIAGNOSIS — F419 Anxiety disorder, unspecified: Secondary | ICD-10-CM | POA: Diagnosis not present

## 2017-09-07 DIAGNOSIS — R350 Frequency of micturition: Secondary | ICD-10-CM | POA: Diagnosis not present

## 2017-09-07 DIAGNOSIS — Z79899 Other long term (current) drug therapy: Secondary | ICD-10-CM | POA: Diagnosis not present

## 2017-09-14 DIAGNOSIS — M85851 Other specified disorders of bone density and structure, right thigh: Secondary | ICD-10-CM | POA: Diagnosis not present

## 2017-09-14 DIAGNOSIS — M8580 Other specified disorders of bone density and structure, unspecified site: Secondary | ICD-10-CM | POA: Diagnosis not present

## 2017-09-14 DIAGNOSIS — N390 Urinary tract infection, site not specified: Secondary | ICD-10-CM | POA: Diagnosis not present

## 2017-09-14 DIAGNOSIS — M8589 Other specified disorders of bone density and structure, multiple sites: Secondary | ICD-10-CM | POA: Diagnosis not present

## 2017-09-22 DIAGNOSIS — M542 Cervicalgia: Secondary | ICD-10-CM | POA: Diagnosis not present

## 2017-09-22 DIAGNOSIS — M79622 Pain in left upper arm: Secondary | ICD-10-CM | POA: Diagnosis not present

## 2017-09-22 DIAGNOSIS — M62522 Muscle wasting and atrophy, not elsewhere classified, left upper arm: Secondary | ICD-10-CM | POA: Diagnosis not present

## 2017-09-23 DIAGNOSIS — R300 Dysuria: Secondary | ICD-10-CM | POA: Diagnosis not present

## 2017-09-23 DIAGNOSIS — N390 Urinary tract infection, site not specified: Secondary | ICD-10-CM | POA: Diagnosis not present

## 2017-09-23 DIAGNOSIS — J069 Acute upper respiratory infection, unspecified: Secondary | ICD-10-CM | POA: Diagnosis not present

## 2017-09-26 DIAGNOSIS — R32 Unspecified urinary incontinence: Secondary | ICD-10-CM | POA: Diagnosis not present

## 2017-09-26 DIAGNOSIS — N390 Urinary tract infection, site not specified: Secondary | ICD-10-CM | POA: Diagnosis not present

## 2017-09-26 DIAGNOSIS — Z79899 Other long term (current) drug therapy: Secondary | ICD-10-CM | POA: Diagnosis not present

## 2017-09-26 DIAGNOSIS — R3982 Chronic bladder pain: Secondary | ICD-10-CM | POA: Diagnosis not present

## 2017-09-26 DIAGNOSIS — N368 Other specified disorders of urethra: Secondary | ICD-10-CM | POA: Diagnosis not present

## 2017-09-26 DIAGNOSIS — R350 Frequency of micturition: Secondary | ICD-10-CM | POA: Diagnosis not present

## 2017-10-01 DIAGNOSIS — I10 Essential (primary) hypertension: Secondary | ICD-10-CM | POA: Diagnosis not present

## 2017-10-01 DIAGNOSIS — F6389 Other impulse disorders: Secondary | ICD-10-CM | POA: Diagnosis not present

## 2017-10-01 DIAGNOSIS — F29 Unspecified psychosis not due to a substance or known physiological condition: Secondary | ICD-10-CM | POA: Diagnosis not present

## 2017-10-03 ENCOUNTER — Encounter: Payer: Self-pay | Admitting: Gastroenterology

## 2017-10-04 ENCOUNTER — Ambulatory Visit (INDEPENDENT_AMBULATORY_CARE_PROVIDER_SITE_OTHER): Payer: Medicare Other | Admitting: Gastroenterology

## 2017-10-04 ENCOUNTER — Encounter: Payer: Self-pay | Admitting: Gastroenterology

## 2017-10-04 VITALS — BP 120/70 | HR 88 | Ht <= 58 in | Wt 83.5 lb

## 2017-10-04 DIAGNOSIS — R634 Abnormal weight loss: Secondary | ICD-10-CM | POA: Diagnosis not present

## 2017-10-04 DIAGNOSIS — R1084 Generalized abdominal pain: Secondary | ICD-10-CM | POA: Diagnosis not present

## 2017-10-04 DIAGNOSIS — K589 Irritable bowel syndrome without diarrhea: Secondary | ICD-10-CM

## 2017-10-04 NOTE — Patient Instructions (Addendum)
If you are age 55 or older, your body mass index should be between 23-30. Your Body mass index is 18.39 kg/m. If this is out of the aforementioned range listed, please consider follow up with your Primary Care Provider.  If you are age 86 or younger, your body mass index should be between 19-25. Your Body mass index is 18.39 kg/m. If this is out of the aformentioned range listed, please consider follow up with your Primary Care Provider.    Gluten-Free Diet for Celiac Disease, Adult The gluten-free diet includes all foods that do not contain gluten. Gluten is a protein that is found in wheat, rye, barley, and some other grains. Following the gluten-free diet is the only treatment for people with celiac disease. It helps to prevent damage to the intestines and improves or eliminates the symptoms of celiac disease. Following the gluten-free diet requires some planning. It can be challenging at first, but it gets easier with time and practice. There are more gluten-free options available today than ever before. If you need help finding gluten-free foods or if you have questions, talk with your diet and nutrition specialist (registered dietitian) or your health care provider. What do I need to know about a gluten-free diet?  All fruits, vegetables, and meats are safe to eat and do not contain gluten.  When grocery shopping, start by shopping in the produce, meat, and dairy sections. These sections are more likely to contain gluten-free foods. Then move to the aisles that contain packaged foods if you need to.  Read all food labels. Gluten is often added to foods. Always check the ingredient list and look for warnings, such as "may contain gluten."  Talk with your dietitian or health care provider before taking a gluten-free multivitamin or mineral supplement.  Be aware of gluten-free foods having contact with foods that contain gluten (cross-contamination). This can happen at home and with any  processed foods. ? Talk with your health care provider or dietitian about how to reduce the risk of cross-contamination in your home. ? If you have questions about how a food is processed, ask the manufacturer. What key words help to identify gluten? Foods that list any of these key words on the label usually contain gluten:  Wheat, flour, enriched flour, bromated flour, white flour, durum flour, graham flour, phosphated flour, self-rising flour, semolina, farina, barley (malt), rye, and oats.  Starch, dextrin, modified food starch, or cereal.  Thickening, fillers, or emulsifiers.  Malt flavoring, malt extract, or malt syrup.  Hydrolyzed vegetable protein.  In the [place] packaged foods that are gluten-free are required to be labeled "GF." These foods should be easy to identify and are safe to eat. In the [place] food companies are also required to list common food allergens, including wheat, on their labels. Recommended foods Grains  Amaranth, bean flours, 100% buckwheat flour, corn, millet, nut flours or nut meals, GF oats, quinoa, rice, sorghum, teff, rice wafers, pure cornmeal tortillas, popcorn, and hot cereals made from cornmeal. Hominy, rice, wild rice. Some Asian rice noodles or bean noodles. Arrowroot starch, corn bran, corn flour, corn germ, cornmeal, corn starch, potato flour, potato starch flour, and rice bran. Plain, brown, and sweet rice flours. Rice polish, soy flour, and tapioca starch. Vegetables  All plain fresh, frozen, and canned vegetables. Fruits  All plain fresh, frozen, canned, and dried fruits, and 100% fruit juices. Meats and other protein foods  All fresh beef, pork, poultry, fish, seafood, and eggs. Fish canned in water,  oil, brine, or vegetable broth. Plain nuts and seeds, peanut butter. Some lunch meat and some frankfurters. Dried beans, dried peas, and lentils. Dairy  Fresh plain, dry, evaporated, or condensed milk. Cream, butter, sour cream, whipping cream,  and most yogurts. Unprocessed cheese, most processed cheeses, some cottage cheese, some cream cheeses. Beverages  Coffee, tea, most herbal teas. Carbonated beverages and some root beers. Wine, sake, and distilled spirits, such as gin, vodka, and whiskey. Most hard ciders. Fats and oils  Butter, margarine, vegetable oil, hydrogenated butter, olive oil, shortening, lard, cream, and some mayonnaise. Some commercial salad dressings. Olives. Sweets and desserts  Sugar, honey, some syrups, molasses, jelly, and jam. Plain hard candy, marshmallows, and gumdrops. Pure cocoa powder. Plain chocolate. Custard and some pudding mixes. Gelatin desserts, sorbets, frozen ice pops, and sherbet. Cake, cookies, and other desserts prepared with allowed flours. Some commercial ice creams. Cornstarch, tapioca, and rice puddings. Seasoning and other foods  Some canned or frozen soups. Monosodium glutamate (MSG). Cider, rice, and wine vinegar. Baking soda and baking powder. Cream of tartar. Baking and nutritional yeast. Certain soy sauces made without wheat (ask your dietitian about specific brands that are allowed). Nuts, coconut, and chocolate. Salt, pepper, herbs, spices, flavoring extracts, imitation or artificial flavorings, natural flavorings, and food colorings. Some medicines and supplements. Some lip glosses and other cosmetics. Rice syrups. The items listed may not be a complete list. Talk with your dietitian about what dietary choices are best for you. Foods to avoid Grains  Barley, bran, bulgur, couscous, cracked wheat, [place], farro, graham, malt, matzo, semolina, wheat germ, and all wheat and rye cereals including spelt and kamut. Cereals containing malt as a flavoring, such as rice cereal. Noodles, spaghetti, macaroni, most packaged rice mixes, and all mixes containing wheat, rye, barley, or triticale. Vegetables  Most creamed vegetables and most vegetables canned in sauces. Some commercially prepared  vegetables and salads. Fruits  Thickened or prepared fruits and some pie fillings. Some fruit snacks and fruit roll-ups. Meats and other protein foods  Any meat or meat alternative containing wheat, rye, barley, or gluten stabilizers. These are often marinated or packaged meats and lunch meats. Bread-containing products, such as Swiss steak, croquettes, meatballs, and meatloaf. Most tuna canned in vegetable broth and [place] with hydrolyzed vegetable protein (HVP) injected as part of the basting. Seitan. Imitation fish. Eggs in sauces made from ingredients to avoid. Dairy  Commercial chocolate milk drinks and malted milk. Some non-dairy creamers. Any cheese product containing ingredients to avoid. Beverages  Certain cereal beverages. Beer, ale, malted milk, and some root beers. Some hard ciders. Some instant flavored coffees. Some herbal teas made with barley or with barley malt added. Fats and oils  Some commercial salad dressings. Sour cream containing modified food starch. Sweets and desserts  Some toffees. Chocolate-coated nuts (may be rolled in wheat flour) and some commercial candies and candy bars. Most cakes, cookies, donuts, pastries, and other baked goods. Some commercial ice cream. Ice cream cones. Commercially prepared mixes for cakes, cookies, and other desserts. Bread pudding and other puddings thickened with flour. Products containing brown rice syrup made with barley malt enzyme. Desserts and sweets made with malt flavoring. Seasoning and other foods  Some curry powders, some dry seasoning mixes, some gravy extracts, some meat sauces, some ketchups, some prepared mustards, and horseradish. Certain soy sauces. Malt vinegar. Bouillon and bouillon cubes that contain HVP. Some chip dips, and some chewing gum. Yeast extract. Brewer's yeast. Caramel color. Some medicines and supplements.  Some lip glosses and other cosmetics. The items listed may not be a complete list. Talk with your  dietitian about what dietary choices are best for you. Summary  Gluten is a protein that is found in wheat, rye, barley, and some other grains. The gluten-free diet includes all foods that do not contain gluten.  If you need help finding gluten-free foods or if you have questions, talk with your diet and nutrition specialist (registered dietitian) or your health care provider.  Read all food labels. Gluten is often added to foods. Always check the ingredient list and look for warnings, such as "may contain gluten." This information is not intended to replace advice given to you by your health care provider. Make sure you discuss any questions you have with your health care provider. Document Released: 05/10/2005 Document Revised: 02/23/2016 Document Reviewed: 02/23/2016 Elsevier Interactive Patient Education  2018 [employer].   Thank you,  Dr. Jackquline Denmark

## 2017-10-04 NOTE — Progress Notes (Signed)
Chief Complaint: FU  Referring Provider:  Dr Delena Bali      ASSESSMENT AND PLAN;   #1. Chronic lower abdominal pain since hysterectomy 04/2016. Dx with IC by urology.  Multiple CT scans- neg 12/2016, 07/2017.  #2. Weight loss with neg CT 07/27/2017 except for retained stool, CT 12/2016 showed colonic thickening. Normal CBC, CMP, TSH.  #3. IBS with alt diarrhea and constipation. Neg stool studies, cologuard test, failed bentyl  - refuses EGD or colonoscopy. She does understand that there is a small but definite risk of missing GI neoplasms. - Had Ba swallow at [facility]. Not in [place].  We will try to get records from [facility]. - Continue probiotics. - Diet- trial of gluten free diet x 2 weeks. - Record weight q weekly.   - Due to the distance of traveling, she will be in touch with [place] over the phone and will get in touch with Dr. Delena Bali to make appointment with Dr. Melina Copa for follow-up locally.  I have told her that we will be more than happy to follow her here.  She will let us know.   HPI:    Donna Francis is a 57 y.o. female  For follow-up visit With occ nausea better with prn zofran. Has lost 4 pounds since the last visit Attributes wt loss to stress/anxiety. Has alternating diarrhea and constipation Adamantly refuses EGD or colonoscopy. Negative cologuard test ([facility]MA) Diagnosed with IC by urology. Has been to the emergency room multiple times for chronic abdominal pain.  Had multiple CT scans which have been unremarkable.  Past Medical History:  Diagnosis Date  . Anxiety   . Colitis   . IBS (irritable bowel syndrome)   . Varicose veins of bilateral lower extremities with pain     Past Surgical History:  Procedure Laterality Date  . ABDOMINAL HYSTERECTOMY  04/2016  . CESAREAN SECTION     x 4  . COLONOSCOPY  1995   in [place]    Family History  Problem Relation Age of Onset  . Pancreatic cancer Mother     Social History   Tobacco Use  . Smoking status:  Never Smoker  . Smokeless tobacco: Never Used  Substance Use Topics  . Alcohol use: Yes    Comment: minimal  . Drug use: No    Current Outpatient Medications  Medication Sig Dispense Refill  . ALPRAZolam (XANAX) 0.5 MG tablet Take 0.5 mg by mouth 3 (three) times daily.     . Probiotic Product (PROBIOTIC DAILY PO) Take by mouth daily.     No current facility-administered medications for this visit.     No Known Allergies  Review of Systems:  Constitutional: Denies fever, chills, diaphoresis, appetite change and fatigue.  HEENT: Denies photophobia, eye pain, redness, hearing loss, ear pain, congestion, sore throat, rhinorrhea, sneezing, mouth sores, neck pain, neck stiffness and tinnitus.   Respiratory: Denies SOB, DOE, cough, chest tightness,  and wheezing.   Cardiovascular: Denies chest pain, palpitations and leg swelling.  Genitourinary: Denies dysuria, urgency, frequency, hematuria, flank pain and difficulty urinating.  Musculoskeletal: Denies myalgias, back pain, joint swelling, arthralgias and gait problem.  Skin: No rash.  Neurological: Denies dizziness, seizures, syncope, weakness, light-headedness, numbness and headaches.  Hematological: Denies adenopathy. Easy bruising, personal or family bleeding history  Psychiatric/Behavioral:  Has anxiety or depression     Physical Exam:    BP 120/70   Pulse 88   Ht 4' 8.5" (1.435 m)   Wt 83 lb  8 oz (37.9 kg)   BMI 18.39 kg/m  Filed Weights   10/04/17 0934  Weight: 83 lb 8 oz (37.9 kg)   Constitutional:  Well-developed, in no acute distress. Psychiatric: Normal mood and affect. Behavior is normal. HEENT: Pupils normal.  Conjunctivae are normal. No scleral icterus. Neck supple.  Cardiovascular: Normal rate, regular rhythm. No edema Pulmonary/chest: Effort normal and breath sounds normal. No wheezing, rales or rhonchi. Abdominal: Soft, nondistended. Nontender. Bowel sounds active throughout. There are no masses palpable. No  hepatomegaly. Rectal:  defered Neurological: Alert and oriented to person place and time. Skin: Skin is warm and dry. No rashes noted.   Carmell Austria, MD 10/04/2017, 9:55 AM  Cc: Dr Delena Bali

## 2017-10-05 ENCOUNTER — Telehealth: Payer: Self-pay | Admitting: Gastroenterology

## 2017-10-05 NOTE — Telephone Encounter (Signed)
Patient wanting to speak with nurse regarding setting up a procedure and getting previous gi records from [place]. Pt was seen yesterday 5.14.19.

## 2017-10-05 NOTE — Telephone Encounter (Signed)
She is going to contact Dr Delena Bali to sign a release of records so test results can be sent to Dr Lyndel Safe.

## 2017-10-13 DIAGNOSIS — H43393 Other vitreous opacities, bilateral: Secondary | ICD-10-CM | POA: Diagnosis not present

## 2017-10-13 DIAGNOSIS — H5213 Myopia, bilateral: Secondary | ICD-10-CM | POA: Diagnosis not present

## 2017-10-13 DIAGNOSIS — H52223 Regular astigmatism, bilateral: Secondary | ICD-10-CM | POA: Diagnosis not present

## 2017-10-13 DIAGNOSIS — H43813 Vitreous degeneration, bilateral: Secondary | ICD-10-CM | POA: Diagnosis not present

## 2017-10-14 ENCOUNTER — Telehealth: Payer: Self-pay

## 2017-10-14 NOTE — Telephone Encounter (Signed)
Pt calling inquiring if we received her records from the [place] and cologuard test from [facility]. She would like a call back to confirm that we received them.

## 2017-10-14 NOTE — Telephone Encounter (Signed)
Informed patient by phone.

## 2017-10-14 NOTE — Telephone Encounter (Signed)
I spoke we the patient and let her know that I do not see these records in her chart.  I did forward the message to the MA's in [facility].

## 2017-10-17 DIAGNOSIS — R29898 Other symptoms and signs involving the musculoskeletal system: Secondary | ICD-10-CM | POA: Diagnosis not present

## 2017-10-17 DIAGNOSIS — Z79899 Other long term (current) drug therapy: Secondary | ICD-10-CM | POA: Diagnosis not present

## 2017-10-17 DIAGNOSIS — R531 Weakness: Secondary | ICD-10-CM | POA: Diagnosis not present

## 2017-10-17 DIAGNOSIS — R569 Unspecified convulsions: Secondary | ICD-10-CM | POA: Diagnosis not present

## 2017-10-17 DIAGNOSIS — F419 Anxiety disorder, unspecified: Secondary | ICD-10-CM | POA: Diagnosis not present

## 2017-10-17 DIAGNOSIS — R4701 Aphasia: Secondary | ICD-10-CM | POA: Diagnosis not present

## 2017-10-17 DIAGNOSIS — Z87440 Personal history of urinary (tract) infections: Secondary | ICD-10-CM | POA: Diagnosis not present

## 2017-10-17 DIAGNOSIS — Z90710 Acquired absence of both cervix and uterus: Secondary | ICD-10-CM | POA: Diagnosis not present

## 2017-10-17 DIAGNOSIS — R4182 Altered mental status, unspecified: Secondary | ICD-10-CM | POA: Diagnosis not present

## 2017-10-17 DIAGNOSIS — R52 Pain, unspecified: Secondary | ICD-10-CM | POA: Diagnosis not present

## 2017-10-17 DIAGNOSIS — K589 Irritable bowel syndrome without diarrhea: Secondary | ICD-10-CM | POA: Diagnosis not present

## 2017-10-17 DIAGNOSIS — R4702 Dysphasia: Secondary | ICD-10-CM | POA: Diagnosis not present

## 2017-10-17 DIAGNOSIS — R4781 Slurred speech: Secondary | ICD-10-CM | POA: Diagnosis not present

## 2017-10-17 DIAGNOSIS — M797 Fibromyalgia: Secondary | ICD-10-CM | POA: Diagnosis not present

## 2017-10-17 DIAGNOSIS — R55 Syncope and collapse: Secondary | ICD-10-CM | POA: Diagnosis not present

## 2017-10-17 DIAGNOSIS — R1310 Dysphagia, unspecified: Secondary | ICD-10-CM | POA: Diagnosis not present

## 2017-10-17 DIAGNOSIS — Z792 Long term (current) use of antibiotics: Secondary | ICD-10-CM | POA: Diagnosis not present

## 2017-10-17 DIAGNOSIS — R404 Transient alteration of awareness: Secondary | ICD-10-CM | POA: Diagnosis not present

## 2017-10-17 DIAGNOSIS — Z9181 History of falling: Secondary | ICD-10-CM | POA: Diagnosis not present

## 2017-10-17 DIAGNOSIS — R51 Headache: Secondary | ICD-10-CM | POA: Diagnosis not present

## 2017-10-17 DIAGNOSIS — G4489 Other headache syndrome: Secondary | ICD-10-CM | POA: Diagnosis not present

## 2017-10-18 ENCOUNTER — Observation Stay (HOSPITAL_COMMUNITY)
Admission: AD | Admit: 2017-10-18 | Discharge: 2017-10-19 | Disposition: A | Discharge status: Home / Self Care | Payer: Medicare Other | Source: Other Acute Inpatient Hospital | Attending: Internal Medicine | Admitting: Internal Medicine

## 2017-10-18 DIAGNOSIS — M797 Fibromyalgia: Secondary | ICD-10-CM | POA: Insufficient documentation

## 2017-10-18 DIAGNOSIS — Z87440 Personal history of urinary (tract) infections: Secondary | ICD-10-CM | POA: Insufficient documentation

## 2017-10-18 DIAGNOSIS — K589 Irritable bowel syndrome without diarrhea: Secondary | ICD-10-CM | POA: Diagnosis not present

## 2017-10-18 DIAGNOSIS — Z79899 Other long term (current) drug therapy: Secondary | ICD-10-CM | POA: Diagnosis not present

## 2017-10-18 DIAGNOSIS — R569 Unspecified convulsions: Secondary | ICD-10-CM | POA: Diagnosis not present

## 2017-10-18 DIAGNOSIS — F419 Anxiety disorder, unspecified: Secondary | ICD-10-CM | POA: Diagnosis not present

## 2017-10-18 DIAGNOSIS — R4702 Dysphasia: Secondary | ICD-10-CM | POA: Diagnosis not present

## 2017-10-18 DIAGNOSIS — Z90710 Acquired absence of both cervix and uterus: Secondary | ICD-10-CM | POA: Insufficient documentation

## 2017-10-18 DIAGNOSIS — R4182 Altered mental status, unspecified: Secondary | ICD-10-CM | POA: Diagnosis present

## 2017-10-18 DIAGNOSIS — R51 Headache: Secondary | ICD-10-CM | POA: Diagnosis not present

## 2017-10-18 DIAGNOSIS — R4701 Aphasia: Secondary | ICD-10-CM | POA: Diagnosis not present

## 2017-10-18 MED ORDER — ENOXAPARIN SODIUM 30 MG/0.3ML ~~LOC~~ SOLN
30.0000 mg | SUBCUTANEOUS | Status: DC
  Filled 2017-10-18: qty 0.3

## 2017-10-18 MED ORDER — ONDANSETRON HCL 4 MG/2ML IJ SOLN: 4.0000 mg | Freq: Four times a day (QID) | INTRAMUSCULAR | Status: DC | PRN

## 2017-10-18 MED ORDER — ALPRAZOLAM 0.5 MG PO TABS
0.5000 mg | ORAL_TABLET | Freq: Three times a day (TID) | ORAL | Status: DC | PRN
  Administered 2017-10-19: 0.5 mg via ORAL
  Filled 2017-10-18: qty 1

## 2017-10-18 MED ORDER — ACETAMINOPHEN 650 MG RE SUPP: 650.0000 mg | Freq: Four times a day (QID) | RECTAL | Status: DC | PRN

## 2017-10-18 MED ORDER — ONDANSETRON HCL 4 MG PO TABS: 4.0000 mg | ORAL_TABLET | Freq: Four times a day (QID) | ORAL | Status: DC | PRN

## 2017-10-18 MED ORDER — ACETAMINOPHEN 325 MG PO TABS: 650.0000 mg | ORAL_TABLET | Freq: Four times a day (QID) | ORAL | Status: DC | PRN

## 2017-10-18 NOTE — H&P (Signed)
History and Physical    Donna Francis YQM:[id number] DOB: 1960/08/29 DOA: 10/18/2017  PCP: Abner Greenspan, MD  Patient coming from: [facility] transfer  I have personally briefly reviewed patient's old medical records in [facility]  Chief Complaint: Seizure-like activity  HPI: Donna Francis is a 57 y.o. female with medical history significant of anxiety, fibromyalgia, recurrent UTIs.  Patient presents to [facility] with complaints of occipital headache, not feeling well, syncope, and noted but by her daughter to have an episode of generalized shaking.  No tongue biting, no urinary or bowel incontinence.  In ED she was initially only able to move head slightly, and moving any of her extremities, episode lasted for few hours, slowly regaining normal mental status.  This morning she was back to baseline motor 5 out of 5, while in MRI, had another episode of unresponsiveness, with generalized shaking, she received IV Ativan with no change of her symptoms, she was seen by their tele neurologist, who recommended Vimpat, but she refused secondary to side effects.  EEG obtained before the MRI shaking episodes which was negative, MRI is negative, CTA head and neck is negative, CT head is negative, LP as well was done in ED which is negative.  Concern for pseudoseizures.  But they couldn't tell for sure so request made for transfer for Video EEG monitoring.   Review of Systems: As per HPI otherwise 10 point review of systems negative.   Past Medical History:  Diagnosis Date  . Anxiety   . Colitis   . IBS (irritable bowel syndrome)   . Varicose veins of bilateral lower extremities with pain     Past Surgical History:  Procedure Laterality Date  . ABDOMINAL HYSTERECTOMY  04/2016  . CESAREAN SECTION     x 4  . COLONOSCOPY  1995   in [place]     reports that she has never smoked. She has never used smokeless tobacco. She reports that she drinks alcohol. She reports that she does not use  drugs.  No Known Allergies  Family History  Problem Relation Age of Onset  . Pancreatic cancer Mother      Prior to Admission medications   Medication Sig Start Date End Date Taking? Authorizing Provider  ALPRAZolam Prudy Feeler) 0.5 MG tablet Take 0.5 mg by mouth 3 (three) times daily.     [provider]  Probiotic Product (PROBIOTIC DAILY PO) Take by mouth daily.    [provider]    Physical Exam: Vitals:   10/18/17 2134  BP: 131/83  Pulse: (!) 107  Resp: 18  Temp: 98.4 F (36.9 C)  SpO2: 100%  Weight: 38.3 kg (84 lb 6.4 oz)  Height: 4\' 8"  (1.422 m)    Constitutional: NAD, calm, comfortable Eyes: PERRL, lids and conjunctivae normal ENMT: Mucous membranes are moist. Posterior pharynx clear of any exudate or lesions.Normal dentition.  Neck: normal, supple, no masses, no thyromegaly Respiratory: clear to auscultation bilaterally, no wheezing, no crackles. Normal respiratory effort. No accessory muscle use.  Cardiovascular: Regular rate and rhythm, no murmurs / rubs / gallops. No extremity edema. 2+ pedal pulses. No carotid bruits.  Abdomen: no tenderness, no masses palpated. No hepatosplenomegaly. Bowel sounds positive.  Musculoskeletal: no clubbing / cyanosis. No joint deformity upper and lower extremities. Good ROM, no contractures. Normal muscle tone.  Skin: no rashes, lesions, ulcers. No induration Neurologic: CN 2-12 grossly intact. Sensation intact, DTR normal. Strength 5/5 in all 4.  Psychiatric: Normal judgment and insight. Alert and oriented  x 3. Normal mood.    Labs on Admission: I have personally reviewed following labs and imaging studies  CBC: No results for input(s): WBC, NEUTROABS, HGB, HCT, MCV, PLT in the last 168 hours. Basic Metabolic Panel: No results for input(s): NA, K, CL, CO2, GLUCOSE, BUN, CREATININE, CALCIUM, MG, PHOS in the last 168 hours. GFR: CrCl cannot be calculated (No order found.). Liver Function Tests: No results for  input(s): AST, ALT, ALKPHOS, BILITOT, PROT, ALBUMIN in the last 168 hours. No results for input(s): LIPASE, AMYLASE in the last 168 hours. No results for input(s): AMMONIA in the last 168 hours. Coagulation Profile: No results for input(s): INR, PROTIME in the last 168 hours. Cardiac Enzymes: No results for input(s): CKTOTAL, CKMB, CKMBINDEX, TROPONINI in the last 168 hours. BNP (last 3 results) No results for input(s): PROBNP in the last 8760 hours. HbA1C: No results for input(s): HGBA1C in the last 72 hours. CBG: No results for input(s): GLUCAP in the last 168 hours. Lipid Profile: No results for input(s): CHOL, HDL, LDLCALC, TRIG, CHOLHDL, LDLDIRECT in the last 72 hours. Thyroid Function Tests: No results for input(s): TSH, T4TOTAL, FREET4, T3FREE, THYROIDAB in the last 72 hours. Anemia Panel: No results for input(s): VITAMINB12, FOLATE, FERRITIN, TIBC, IRON, RETICCTPCT in the last 72 hours. Urine analysis: No results found for: COLORURINE, APPEARANCEUR, LABSPEC, PHURINE, GLUCOSEU, HGBUR, BILIRUBINUR, KETONESUR, PROTEINUR, UROBILINOGEN, NITRITE, LEUKOCYTESUR  Radiological Exams on Admission: No results found.  EKG: Independently reviewed.  Assessment/Plan Principal Problem:   Seizure-like activity (HCC)    1. Seizure-like activity - 1. Have informed neurology of patients arrival 2. Not sure that they were informed of video EEG monitoring request.  Per Dr. Wilford Corner it sounds like that's not typically done here as an inpatient.  DVT prophylaxis: Lovenox Code Status: Full Family Communication: No family in room Disposition Plan: Home after admit Consults called: Neuro Admission status: Place in obs   Hillary Bow. DO [facility] Pager [phone]  If 7AM-7PM, please contact day team taking care of patient [email or web address] Password [email or web address]  10/18/2017, 10:03 PM

## 2017-10-18 NOTE — Consult Note (Signed)
Neurology Consultation  Reason for Consult: Video EEG monitoring Referring Physician: dr Alcario Drought  CC: Altered mental status [facility]- sent for video EEG monitoring  History is obtained from: Patient, review of paper charts from [place]  HPI: Donna Francis is a 57 y.o. female past medical history of severe anxiety, fibromyalgia, irritable bowel syndrome, who was in her usual state of health till Sunday, and on Monday, Memorial Day had some symptoms concerning for a neurological status change for which she was taken to [facility]. She reports that she was in her usual state of health until Monday morning, when she noted that she is having difficulty controlling her arms and legs.  She has been under a lot of stress lately.  She has had a hysterectomy done a year and a half ago, following which she has had many medical issues and has been in and out of the hospital multiple times.  She also has severe anxiety for which she takes Xanax regularly.  She said that Monday morning her hands felt discoordinated and she felt confused.  She was able to understand what people were saying but said that her body felt like it had shut down and did not want to speak.  She was brought in as an acute code stroke, evaluated by telemedicine neurology, had EEG and MRI done which were both unremarkable for any focal or lateralizing findings. As she was in the MRI, she had another episode of her unresponsiveness as well as possible shaking of arms, concerning for seizures.  To the best of my review of records, the telemedicine neurology consultant had recommended a video EEG to differentiate between seizures and pseudoseizures for which, and epilepsy monitoring unit consultation should have been made, but instead the patient was sent to [facility]. At the time of this encounter, the patient is awake alert oriented x3 with no focal deficits.  She gave me a coherent history and said that all her symptoms are  completely recollectable and she never lost consciousness although she seemed unresponsive.  Again she admitted multiple times that she has been extremely stressed and anxious due to multiple health reasons and personal issues including passing away of a beloved pet that has made her on the edge and have these myriad of symptoms. Since the patient remembers the episodes coherently, there is no concern for status epilepticus or long-term EEG monitoring.  ROS: ROS was performed and is negative except as noted in the HPI.   Past Medical History:  Diagnosis Date  . Anxiety   . Colitis   . IBS (irritable bowel syndrome)   . Varicose veins of bilateral lower extremities with pain    Family History  Problem Relation Age of Onset  . Pancreatic cancer Mother     Social History:   reports that she has never smoked. She has never used smokeless tobacco. She reports that she drinks alcohol. She reports that she does not use drugs.  Medications  Current Facility-Administered Medications:  .  acetaminophen (TYLENOL) tablet 650 mg, 650 mg, Oral, Q6H PRN **OR** acetaminophen (TYLENOL) suppository 650 mg, 650 mg, Rectal, Q6H PRN, Etta Quill, DO .  ALPRAZolam Duanne Moron) tablet 0.5 mg, 0.5 mg, Oral, TID PRN, Etta Quill, DO .  enoxaparin (LOVENOX) injection 30 mg, 30 mg, Subcutaneous, Q24H, Gardner, Jared M, DO .  ondansetron (ZOFRAN) tablet 4 mg, 4 mg, Oral, Q6H PRN **OR** ondansetron (ZOFRAN) injection 4 mg, 4 mg, Intravenous, Q6H PRN, Etta Quill, DO  Exam:  Current vital signs: BP 131/83 (BP Location: Right Arm)   Pulse (!) 107   Temp 98.4 F (36.9 C)   Resp 18   Ht _0  (1.422 m)   Wt 38.3 kg (84 lb 6.4 oz)   SpO2 100%   BMI 18.92 kg/m  Vital signs in last 24 hours: Temp:  [98.4 F (36.9 C)] 98.4 F (36.9 C) (05/28 2134) Pulse Rate:  [107] 107 (05/28 2134) Resp:  [18] 18 (05/28 2134) BP: (131)/(83) 131/83 (05/28 2134) SpO2:  [100 %] 100 % (05/28 2134) Weight:  [38.3 kg  (84 lb 6.4 oz)] 38.3 kg (84 lb 6.4 oz) (05/28 2134)  GENERAL: Thin woman, awake, alert in NAD HEENT: - Normocephalic and atraumatic, dry mm, no LN++, no Thyromegally LUNGS - Clear to auscultation bilaterally with no wheezes CV - S1S2 RRR, no m/r/g, equal pulses bilaterally. ABDOMEN - Soft, nontender, nondistended with normoactive BS Ext: warm, well perfused, intact peripheral pulses, no edema  NEURO:  Mental Status: AA&Ox3  Language: speech is clear.  Naming, repetition, fluency, and comprehension intact. Cranial Nerves: PERRL. EOMI, visual fields full, no facial asymmetry facial sensation intact, hearing intact, tongue/uvula/soft palate midline, normal sternocleidomastoid and trapezius muscle strength. No evidence of tongue atrophy or fibrillations Motor: Symmetric 5/5 in all 4 extremities, with no vertical drift Tone: is normal and bulk is normal Sensation- Intact to light touch bilaterally Coordination: FTN intact bilaterally, no ataxia in BLE. Gait- deferred  NIHSS-0   Labs I have reviewed labs in epic and the results pertinent to this consultation are:  CBC No results found for: WBC, RBC, HGB, HCT, PLT, MCV, MCH, MCHC, RDW, LYMPHSABS, MONOABS, EOSABS, BASOSABS No labs done at [facility]. Labs from [place]-show anemia. Otherwise unremarkable  CMP  No results found for: NA, K, CL, CO2, GLUCOSE, BUN, CREATININE, CALCIUM, PROT, ALBUMIN, AST, ALT, ALKPHOS, BILITOT, GFRNONAA, GFRAA No labs at [facility] from [place]  Imaging I have reviewed the images obtained:  CT-scan of the brain-done at [place].  No bleed.  CT angios head and neck showed no large vessel occlusion.  MRI examination of the brain- no infarct, no bleed, no focal abnormalities to explain seizures.  EEG done at [facility] for any acute process or lateralizing findings  Assessment:  57 year old woman with past medical history of severe anxiety, fibromyalgia, irritable bowel  syndrome, sent to [facility] for video EEG monitoring after she had 2 episodes of unresponsiveness and concern for seizures. Her exam, per review of the telemedicine neurology records, was nonfocal. Her exam today for me, is also nonfocal. She seems to remember the details of the episodes that had happened, and says that she was very anxious and nervous and unable to talk but was able to comprehend what others were saying. She has been under extreme duress due to personal issues, health conditions and passing away of her pet animal, which has caused extreme mental anxiety. I suspect that she had some sort of a severe response to anxiety, and not a real seizure. EEG was unremarkable, MRI has been unremarkable and labs are also unremarkable. If there is a question about an underlying seizure versus pseudoseizure, that is best addressed at an outpatient epilepsy monitoring unit, unfortunately inpatient video EEG services are more appropriate for patients with concern for clinical or subclinical status epilepticus.  Impression: Episodes of unresponsiveness- concern for pseudoseizures Extreme anxiety  Recommendations: -At this time, I would not recommend any antiepileptics for this patient as it is unclear as  to what her episodes were like. -I would recommend outpatient psychiatry evaluation. -I do not think she needs an outpatient neurology work-up at this time, but if the episodes were to recur, she should at that time be evaluated by long-term EEG and epilepsy monitoring unit. -Although my suspicion for seizure is very low, episodes of unexplained unresponsiveness should be treated as suspected seizures as far as driving restrictions are concerned per the state law.  Patient should not drive for at least 6 months seizure/episode free.  Neurology services will be available to answer questions as needed.  We will sign off at this point, please call us with any questions.   -- Amie Portland, MD [facility] Pager: [phone] If 7pm to 7am, please call on call as listed on AMION.

## 2017-10-19 ENCOUNTER — Encounter (HOSPITAL_COMMUNITY): Payer: Self-pay

## 2017-10-19 ENCOUNTER — Other Ambulatory Visit: Payer: Self-pay

## 2017-10-19 DIAGNOSIS — K589 Irritable bowel syndrome without diarrhea: Secondary | ICD-10-CM | POA: Diagnosis not present

## 2017-10-19 DIAGNOSIS — R569 Unspecified convulsions: Secondary | ICD-10-CM | POA: Diagnosis not present

## 2017-10-19 DIAGNOSIS — F419 Anxiety disorder, unspecified: Secondary | ICD-10-CM | POA: Diagnosis not present

## 2017-10-19 DIAGNOSIS — Z79899 Other long term (current) drug therapy: Secondary | ICD-10-CM | POA: Diagnosis not present

## 2017-10-19 DIAGNOSIS — R51 Headache: Secondary | ICD-10-CM | POA: Diagnosis not present

## 2017-10-19 DIAGNOSIS — M797 Fibromyalgia: Secondary | ICD-10-CM | POA: Diagnosis not present

## 2017-10-19 NOTE — Progress Notes (Signed)
Donna Francis to be D/C'd Home per MD order.  Discussed with the patient and all questions fully answered.  VSS, Skin clean, dry and intact without evidence of skin break down, no evidence of skin tears noted. IV catheter discontinued intact. Site without signs and symptoms of complications. Dressing and pressure applied.  An After Visit Summary was printed and given to the patient. Patient received prescription.  D/c education completed with patient/family including follow up instructions, medication list, d/c activities limitations if indicated, with other d/c instructions as indicated by MD - patient able to verbalize understanding, all questions fully answered.   Patient instructed to return to ED, call 911, or call MD for any changes in condition.   Patient escorted via [place], and D/C home via cab by [facility] nurse.   [name] 10/19/2017 11:21 AM

## 2017-10-19 NOTE — Progress Notes (Signed)
CSW was consulted by RN Sam for transportation needs.  CSW received authorization from [place] for cab voucher for patient.  No further needs noted.  CSW signing off.  Reed Breech LCSWA [phone]

## 2017-10-19 NOTE — Discharge Summary (Signed)
Physician Discharge Summary  [name] UDJ:[id number] DOB: 1960/08/11 DOA: 10/18/2017  PCP: Marco Collie, MD  Admit date: 10/18/2017 Discharge date: 10/19/2017  Admitted From home Disposition: Home Recommendations for Outpatient Follow-up:  1. Follow up with PCP in 1-2 weeks 2. Please obtain BMP/CBC in one week  Home Health none Equipment/Devices none Discharge Condition: Stable CODE STATUS full code Diet recommendation: Cardiac diet  Brief/Interim Summary: 57 y.o. female with medical history significant of anxiety, fibromyalgia, recurrent UTIs.  Patient presents to [facility] with complaints of occipital headache, not feeling well, syncope, and noted but by her daughter to have an episode of generalized shaking.  No tongue biting, no urinary or bowel incontinence.  In ED she was initially only able to move head slightly, and moving any of her extremities, episode lasted for few hours, slowly regaining normal mental status.  This morning she was back to baseline motor 5 out of 5, while in MRI, had another episode of unresponsiveness, with generalized shaking, she received IV Ativan with no change of her symptoms, she was seen by their tele neurologist, who recommended Vimpat, but she refused secondary to side effects.  EEG obtained before the MRI shaking episodes which was negative, MRI is negative, CTA head and neck is negative, CT head is negative, LP as well was done in ED which is negative.  Concern for pseudoseizures.  But they couldn't tell for sure so request made for transfer for Video EEG monitoring.    Discharge Diagnoses:  Principal Problem:   Seizure-like activity ([place]) 1] brief.'s of unresponsiveness-patient was sent to [facility] from [place] [facility] to be seen by neurologist and to have video monitoring of EEG for possible seizures.  All the work-up done so far MRI of the brain CT of the brain LP EEG has been unremarkable.  Patient has been awake alert and  able to give a good history and remembers everything that has happened.  She he was seen in consultation by neurology and recommended that she sees an outpatient psychiatrist for possible ongoing stress related to her health as well as passing away of her pet of  that she was very close to.  And because of these periods  of unresponsiveness she should also refrain from driving for 6 months.  She should follow-up with her PCP.   Discharge Instructions  Discharge Instructions    Call MD for:  difficulty breathing, headache or visual disturbances   Complete by:  As directed    Call MD for:  persistant dizziness or light-headedness   Complete by:  As directed    Call MD for:  persistant nausea and vomiting   Complete by:  As directed    Call MD for:  severe uncontrolled pain   Complete by:  As directed    Diet - low sodium heart healthy   Complete by:  As directed    Increase activity slowly   Complete by:  As directed      Allergies as of 10/19/2017   No Known Allergies     Medication List    TAKE these medications   ALPRAZolam 0.5 MG tablet Commonly known as:  XANAX Take 0.5 mg by mouth 3 (three) times daily.   PROBIOTIC DAILY PO Take by mouth daily.      Follow-up Information    Marco Collie, MD Follow up.   Specialty:  Family Medicine Contact information: [address] [place] [phone]  No Known Allergies  Consultations: neuro  Procedures/Studies:  No results found. (Echo, Carotid, EGD, Colonoscopy, ERCP)    Subjective: Awake alert denies any complaints no headaches or changes with her vision she was able to ambulate no difficulty speaking or swallowing no chest pain or shortness of breath nausea vomiting.   Discharge Exam: Vitals:   10/18/17 2134 10/19/17 0425  BP: 131/83 130/70  Pulse: (!) 107 64  Resp: 18 18  Temp: 98.4 F (36.9 C) 98.5 F (36.9 C)  SpO2: 100% 100%   Vitals:   10/18/17 2134 10/19/17 0425  BP:  131/83 130/70  Pulse: (!) 107 64  Resp: 18 18  Temp: 98.4 F (36.9 C) 98.5 F (36.9 C)  TempSrc:  Oral  SpO2: 100% 100%  Weight: 38.3 kg (84 lb 6.4 oz)   Height: 4\' 8"  (1.422 m)     General: Pt is alert, awake, not in acute distress Cardiovascular: RRR, S1/S2 +, no rubs, no gallops Respiratory: CTA bilaterally, no wheezing, no rhonchi Abdominal: Soft, NT, ND, bowel sounds + Extremities: no edema, no cyanosis    The results of significant diagnostics from this hospitalization (including imaging, microbiology, ancillary and laboratory) are listed below for reference.     Microbiology: No results found for this or any previous visit (from the past 240 hour(s)).   Labs: BNP (last 3 results) No results for input(s): BNP in the last 8760 hours. Basic Metabolic Panel: No results for input(s): NA, K, CL, CO2, GLUCOSE, BUN, CREATININE, CALCIUM, MG, PHOS in the last 168 hours. Liver Function Tests: No results for input(s): AST, ALT, ALKPHOS, BILITOT, PROT, ALBUMIN in the last 168 hours. No results for input(s): LIPASE, AMYLASE in the last 168 hours. No results for input(s): AMMONIA in the last 168 hours. CBC: No results for input(s): WBC, NEUTROABS, HGB, HCT, MCV, PLT in the last 168 hours. Cardiac Enzymes: No results for input(s): CKTOTAL, CKMB, CKMBINDEX, TROPONINI in the last 168 hours. BNP: Invalid input(s): POCBNP CBG: No results for input(s): GLUCAP in the last 168 hours. D-Dimer No results for input(s): DDIMER in the last 72 hours. Hgb A1c No results for input(s): HGBA1C in the last 72 hours. Lipid Profile No results for input(s): CHOL, HDL, LDLCALC, TRIG, CHOLHDL, LDLDIRECT in the last 72 hours. Thyroid function studies No results for input(s): TSH, T4TOTAL, T3FREE, THYROIDAB in the last 72 hours.  Invalid input(s): FREET3 Anemia work up No results for input(s): VITAMINB12, FOLATE, FERRITIN, TIBC, IRON, RETICCTPCT in the last 72 hours. Urinalysis No results found  for: COLORURINE, APPEARANCEUR, LABSPEC, [place], GLUCOSEU, HGBUR, BILIRUBINUR, KETONESUR, PROTEINUR, UROBILINOGEN, NITRITE, LEUKOCYTESUR Sepsis Labs Invalid input(s): PROCALCITONIN,  WBC,  LACTICIDVEN Microbiology No results found for this or any previous visit (from the past 240 hour(s)).   Time coordinating discharge: 33 minutes  SIGNED:   Georgette Shell, MD  [facility] 10/19/2017, 8:22 AM Pager   If 7PM-7AM, please contact night-coverage [email or web address] Password [email or web address]

## 2017-10-20 DIAGNOSIS — R000 Tachycardia, unspecified: Secondary | ICD-10-CM | POA: Diagnosis not present

## 2017-10-20 DIAGNOSIS — R569 Unspecified convulsions: Secondary | ICD-10-CM | POA: Diagnosis not present

## 2017-10-20 DIAGNOSIS — G4489 Other headache syndrome: Secondary | ICD-10-CM | POA: Diagnosis not present

## 2017-10-20 DIAGNOSIS — I10 Essential (primary) hypertension: Secondary | ICD-10-CM | POA: Diagnosis not present

## 2017-10-20 DIAGNOSIS — G4089 Other seizures: Secondary | ICD-10-CM | POA: Diagnosis not present

## 2017-10-20 DIAGNOSIS — R402441 Other coma, without documented Glasgow coma scale score, or with partial score reported, in the field [EMT or ambulance]: Secondary | ICD-10-CM | POA: Diagnosis not present

## 2017-10-20 DIAGNOSIS — R0902 Hypoxemia: Secondary | ICD-10-CM | POA: Diagnosis not present

## 2017-10-21 DIAGNOSIS — F430 Acute stress reaction: Secondary | ICD-10-CM | POA: Diagnosis not present

## 2017-10-21 DIAGNOSIS — R404 Transient alteration of awareness: Secondary | ICD-10-CM | POA: Diagnosis not present

## 2017-10-21 DIAGNOSIS — F445 Conversion disorder with seizures or convulsions: Secondary | ICD-10-CM | POA: Diagnosis not present

## 2017-10-21 DIAGNOSIS — F419 Anxiety disorder, unspecified: Secondary | ICD-10-CM | POA: Diagnosis not present

## 2017-10-23 DIAGNOSIS — G4089 Other seizures: Secondary | ICD-10-CM | POA: Diagnosis not present

## 2017-10-23 DIAGNOSIS — I10 Essential (primary) hypertension: Secondary | ICD-10-CM | POA: Diagnosis not present

## 2017-10-23 DIAGNOSIS — D509 Iron deficiency anemia, unspecified: Secondary | ICD-10-CM | POA: Diagnosis not present

## 2017-10-23 DIAGNOSIS — R000 Tachycardia, unspecified: Secondary | ICD-10-CM | POA: Diagnosis not present

## 2017-10-23 DIAGNOSIS — F411 Generalized anxiety disorder: Secondary | ICD-10-CM | POA: Diagnosis not present

## 2017-10-23 DIAGNOSIS — F329 Major depressive disorder, single episode, unspecified: Secondary | ICD-10-CM | POA: Diagnosis not present

## 2017-10-23 DIAGNOSIS — Z8669 Personal history of other diseases of the nervous system and sense organs: Secondary | ICD-10-CM | POA: Diagnosis not present

## 2017-10-23 DIAGNOSIS — Z79899 Other long term (current) drug therapy: Secondary | ICD-10-CM | POA: Diagnosis not present

## 2017-10-24 ENCOUNTER — Telehealth: Payer: Self-pay | Admitting: Gastroenterology

## 2017-10-24 DIAGNOSIS — Z1211 Encounter for screening for malignant neoplasm of colon: Secondary | ICD-10-CM

## 2017-10-24 DIAGNOSIS — K589 Irritable bowel syndrome without diarrhea: Secondary | ICD-10-CM

## 2017-10-24 DIAGNOSIS — R569 Unspecified convulsions: Secondary | ICD-10-CM

## 2017-10-24 NOTE — Telephone Encounter (Signed)
Patient states she is having stomach spasms, and is still losing a lot of weight. Patient thinks her colon needs to be looked at and wants to know if a virtual colon or the pill can be scheduled.

## 2017-10-24 NOTE — Telephone Encounter (Signed)
You had recommended procedure to the patient at her last OV. Please advise.

## 2017-10-24 NOTE — Telephone Encounter (Signed)
Problem with virtual colonoscopy is that she will need colonoscopy for short colonoscopy is positive. Let us try to schedule her for virtual colonoscopy - if INS approves. I thought she was to follow up with Dr. Melina Copa

## 2017-10-25 NOTE — Telephone Encounter (Signed)
Patient aware. Understands she may need colonoscopy after virtual. Insurance may not approve. She will expect a call from [facility] imaging to schedule the procedure.

## 2017-11-01 ENCOUNTER — Telehealth: Payer: Self-pay

## 2017-11-01 NOTE — Telephone Encounter (Signed)
Pt states she is having a lot of pain and cramping. Pt states she has not heard from [facility] to schedule. Best call back # [phone].

## 2017-11-01 NOTE — Telephone Encounter (Signed)
Order for CT Virtual colonoscopy was placed 10-25-17, this was confirmed with [facility] and the patient was notified of this by phone.  She does continue on a daily probiotic

## 2017-11-21 ENCOUNTER — Telehealth: Payer: Self-pay | Admitting: Gastroenterology

## 2017-11-21 NOTE — Telephone Encounter (Signed)
Error

## 2017-11-21 NOTE — Telephone Encounter (Signed)
She is prepping for her virtual colonoscopy scheduled for tomorrow.  The contrast and prep are making her nauseous and she is also having watery stools. I encouraged her to continue the prep to the best of her ability, take some clear liquids besides water to help keep her strength up.  She will let us know if she is unable to complete prep.

## 2017-11-22 ENCOUNTER — Ambulatory Visit
Admission: RE | Admit: 2017-11-22 | Discharge: 2017-11-22 | Disposition: A | Discharge status: Home / Self Care | Payer: Medicare Other | Source: Ambulatory Visit | Attending: Gastroenterology | Admitting: Gastroenterology

## 2017-11-22 DIAGNOSIS — K589 Irritable bowel syndrome without diarrhea: Secondary | ICD-10-CM

## 2017-11-22 DIAGNOSIS — R569 Unspecified convulsions: Secondary | ICD-10-CM

## 2017-11-22 DIAGNOSIS — Z1211 Encounter for screening for malignant neoplasm of colon: Secondary | ICD-10-CM

## 2017-11-22 DIAGNOSIS — K5641 Fecal impaction: Secondary | ICD-10-CM | POA: Diagnosis not present

## 2017-11-25 ENCOUNTER — Telehealth: Payer: Self-pay | Admitting: Gastroenterology

## 2017-11-25 DIAGNOSIS — E876 Hypokalemia: Secondary | ICD-10-CM | POA: Diagnosis not present

## 2017-11-25 NOTE — Telephone Encounter (Signed)
Pt called inquiring about results of virtual colonoscopy.

## 2017-11-25 NOTE — Telephone Encounter (Signed)
Asking for test results.  Please advise.  Thank you.

## 2017-11-30 ENCOUNTER — Telehealth: Payer: Self-pay | Admitting: Gastroenterology

## 2017-12-06 NOTE — Telephone Encounter (Signed)
Patient states she is having chest pain and abd cramps and would like a call to get some advice. Patient is wanting to speak with nurse or doctor.

## 2017-12-06 NOTE — Telephone Encounter (Signed)
Perfect Lets do Zantac 150 once a day.  She can even take it twice a day if she still has problems

## 2017-12-06 NOTE — Telephone Encounter (Signed)
She says she has had a spike in her GERD symptoms since her barium swallow.  She wanted you to know that she has been adjusting her Zantac and has found the 150 mg daily works better for her than 75 mg BID, she takes this along with Mylanta.  I assured her that you would be OK with whichever dosing regime provided the most relieve of her symptoms as long as it was with prescribing guidelines.  OK?

## 2017-12-07 NOTE — Telephone Encounter (Signed)
I had told the patient yesterday that it would be OK to take 150 mg daily.  She will contact us if she continues to have an increase in symptoms.

## 2017-12-12 DIAGNOSIS — E860 Dehydration: Secondary | ICD-10-CM | POA: Diagnosis not present

## 2017-12-12 DIAGNOSIS — R140 Abdominal distension (gaseous): Secondary | ICD-10-CM | POA: Diagnosis not present

## 2017-12-12 DIAGNOSIS — R197 Diarrhea, unspecified: Secondary | ICD-10-CM | POA: Diagnosis not present

## 2017-12-13 ENCOUNTER — Telehealth: Payer: Self-pay

## 2017-12-13 DIAGNOSIS — K589 Irritable bowel syndrome without diarrhea: Secondary | ICD-10-CM | POA: Diagnosis not present

## 2017-12-13 DIAGNOSIS — Z79899 Other long term (current) drug therapy: Secondary | ICD-10-CM | POA: Diagnosis not present

## 2017-12-13 DIAGNOSIS — F445 Conversion disorder with seizures or convulsions: Secondary | ICD-10-CM | POA: Diagnosis not present

## 2017-12-13 DIAGNOSIS — F430 Acute stress reaction: Secondary | ICD-10-CM | POA: Diagnosis not present

## 2017-12-13 DIAGNOSIS — R197 Diarrhea, unspecified: Secondary | ICD-10-CM | POA: Diagnosis not present

## 2017-12-13 NOTE — Telephone Encounter (Signed)
Pt states she is very sick and had to go to the ED. Pt is requesting to speak with the nurse. Best call back [phone].

## 2017-12-13 NOTE — Telephone Encounter (Signed)
Addendum  I got kicked out of Epic before I finished the last note.  Her PCP asked her to stop the Mylanta and Zantac while on the Carafate.  She is again asking for a phone call from you.

## 2017-12-13 NOTE — Telephone Encounter (Signed)
Stop the Zantac and Mylanta while on the carafate.  I have sent another message to Dr Lyndel Safe asking him to call the patient.

## 2017-12-13 NOTE — Telephone Encounter (Signed)
Hard to get hold of her Left another message today

## 2017-12-13 NOTE — Telephone Encounter (Signed)
She called the office to let us know that she went to the ED by ambulance yesterday.  Extreme fatigue, diarrhea and abd pain.  She states she has just not been "right" since the virtual colonoscopy.  Her symptoms are all over the board and wax and wane.  ED docs started her on Carafate ac and hs.  Saw her PCP today and she told her to

## 2017-12-15 ENCOUNTER — Telehealth: Payer: Self-pay | Admitting: Gastroenterology

## 2017-12-15 NOTE — Telephone Encounter (Signed)
Pt called to inform that she is going to resume taking mylanta twice a day and stop carafate 1 gr.

## 2017-12-15 NOTE — Telephone Encounter (Signed)
FYI Dr. Gupta.

## 2017-12-15 NOTE — Telephone Encounter (Signed)
Have talked with the patient in detail last night and this afternoon. Virtual colonoscopy was negative Not having any more diarrhea Has been taking Carafate Told me that she is very sensitive to medicines Discussed in detail. Would reduce Carafate 1/2  twice a day. She is going to [place] in 1 week to be with her daughter. Please call her next week

## 2017-12-22 ENCOUNTER — Telehealth: Payer: Self-pay | Admitting: Gastroenterology

## 2017-12-22 NOTE — Telephone Encounter (Signed)
I spoke with her this morning, you do not need to call her.  She just wanted to rehash prior conversations and then to let you know that the Mylanta seems to be working for her.  She is taking it on an as needed basis and I told her to continue with that.

## 2018-02-14 DIAGNOSIS — F419 Anxiety disorder, unspecified: Secondary | ICD-10-CM | POA: Diagnosis not present

## 2018-02-14 DIAGNOSIS — R0789 Other chest pain: Secondary | ICD-10-CM | POA: Diagnosis not present

## 2018-02-14 DIAGNOSIS — R0602 Shortness of breath: Secondary | ICD-10-CM | POA: Diagnosis not present

## 2018-02-14 DIAGNOSIS — R079 Chest pain, unspecified: Secondary | ICD-10-CM | POA: Diagnosis not present

## 2018-02-21 ENCOUNTER — Telehealth: Payer: Self-pay | Admitting: Gastroenterology

## 2018-02-21 DIAGNOSIS — F445 Conversion disorder with seizures or convulsions: Secondary | ICD-10-CM | POA: Diagnosis not present

## 2018-02-21 DIAGNOSIS — K589 Irritable bowel syndrome without diarrhea: Secondary | ICD-10-CM | POA: Diagnosis not present

## 2018-02-21 DIAGNOSIS — R634 Abnormal weight loss: Secondary | ICD-10-CM | POA: Diagnosis not present

## 2018-02-21 DIAGNOSIS — R350 Frequency of micturition: Secondary | ICD-10-CM | POA: Diagnosis not present

## 2018-02-21 MED ORDER — ALIGN PO CAPS: 1.0000 | ORAL_CAPSULE | Freq: Every day | ORAL | 5 refills | Status: DC

## 2018-02-21 NOTE — Telephone Encounter (Signed)
Would you like to give her a prescription for a probiotic? If so which one and how many?

## 2018-02-21 NOTE — Telephone Encounter (Signed)
Sent prescription to patients pharmacy, patient aware.

## 2018-02-21 NOTE — Telephone Encounter (Signed)
She can use align 1 tablet p.o. once a day

## 2018-02-24 MED ORDER — ALIGN PO CAPS: 1.0000 | ORAL_CAPSULE | Freq: Every day | ORAL | 5 refills | Status: DC

## 2018-02-24 NOTE — Addendum Note (Signed)
Addended by: Karena Addison on: 02/24/2018 03:16 PM   Modules accepted: Orders

## 2018-02-24 NOTE — Telephone Encounter (Signed)
Patient states probiotic was not at pharmacy and would like it resent to [facility] for pick up today.

## 2018-02-24 NOTE — Telephone Encounter (Signed)
Sent medication to [facility], patient aware.

## 2018-02-27 ENCOUNTER — Telehealth: Payer: Self-pay

## 2018-02-27 MED ORDER — ALIGN PO CAPS: 1.0000 | ORAL_CAPSULE | Freq: Every day | ORAL | 0 refills | Status: DC

## 2018-02-27 MED ORDER — ALIGN PO CAPS: 1.0000 | ORAL_CAPSULE | Freq: Every day | ORAL | 5 refills | Status: DC

## 2018-02-27 NOTE — Addendum Note (Signed)
Addended by: Karena Addison on: 02/27/2018 10:33 AM   Modules accepted: Orders

## 2018-02-27 NOTE — Telephone Encounter (Signed)
I have faxed it manually to the pharmacy.

## 2018-02-27 NOTE — Telephone Encounter (Signed)
Pt need the prescription to be sent again, pharmacy states they did not received.

## 2018-02-27 NOTE — Telephone Encounter (Signed)
Resent prescription to patients pharmacy.  

## 2018-02-28 DIAGNOSIS — R002 Palpitations: Secondary | ICD-10-CM | POA: Diagnosis not present

## 2018-03-03 ENCOUNTER — Encounter: Payer: Self-pay | Admitting: Gastroenterology

## 2018-03-06 ENCOUNTER — Encounter: Payer: Self-pay | Admitting: Gastroenterology

## 2018-03-06 ENCOUNTER — Telehealth: Payer: Self-pay | Admitting: Gastroenterology

## 2018-03-06 ENCOUNTER — Ambulatory Visit (INDEPENDENT_AMBULATORY_CARE_PROVIDER_SITE_OTHER): Payer: Medicare Other | Admitting: Gastroenterology

## 2018-03-06 VITALS — BP 142/76 | HR 80 | Ht <= 58 in | Wt 76.0 lb

## 2018-03-06 DIAGNOSIS — R634 Abnormal weight loss: Secondary | ICD-10-CM | POA: Diagnosis not present

## 2018-03-06 DIAGNOSIS — R1011 Right upper quadrant pain: Secondary | ICD-10-CM

## 2018-03-06 MED ORDER — FAMOTIDINE 40 MG PO TABS: 40.0000 mg | ORAL_TABLET | Freq: Every day | ORAL | 11 refills | Status: DC

## 2018-03-06 MED ORDER — SACCHAROMYCES BOULARDII 250 MG PO CAPS: 250.0000 mg | ORAL_CAPSULE | Freq: Every day | ORAL | 3 refills | Status: DC

## 2018-03-06 NOTE — Patient Instructions (Signed)
If you are age 55 or older, your body mass index should be between 23-30. Your Body mass index is 16.74 kg/m. If this is out of the aforementioned range listed, please consider follow up with your Primary Care Provider.  If you are age 23 or younger, your body mass index should be between 19-25. Your Body mass index is 16.74 kg/m. If this is out of the aformentioned range listed, please consider follow up with your Primary Care Provider.   We have sent the following medications to your pharmacy for you to pick up at your convenience: Pepcid 40 mg once daily. Florastor once daily.  You have been scheduled for an abdominal ultrasound at [facility]  (1st floor ) on 03/08/18 at 10am. Please arrive 15 minutes prior to your appointment for registration. Make certain not to have anything to eat or drink 6 hours prior to your appointment. Should you need to reschedule your appointment, please contact radiology at [phone]. This test typically takes about 30 minutes to perform.   Thank you,  Dr. Jackquline Denmark

## 2018-03-06 NOTE — Progress Notes (Signed)
Chief Complaint: FU  Referring Provider:  Dr Delena Bali      ASSESSMENT AND PLAN;   #1. Chronic lower abdominal pain since hysterectomy 04/2016. Dx with IC by urology.  Multiple CT scans- neg 12/2016, 07/2017.  #2. Weight loss with neg CT 07/27/2017 except for retained stool, CT 12/2016 showed colonic thickening. Normal CBC, CMP, TSH. Neg Virtual colonoscopy 11/22/2017. I believe anxiety playing a significant role.  #3. IBS with alt diarrhea and constipation. Neg stool studies, cologuard test, failed bentyl.  #4. GERD with neg Ba Swallow 08/2017.  #5. RUQ pain  Plan: - Refuses EGD. She does understand that there is a small but definite risk of missing GI neoplasms. - US abdomen. - Probiotics Florastor 1 tab po qd (Pt would like to have this particular probiotic). - Record weight q weekly.   - Increased Pepcid 40mg  po qd. - FU in 12 weeks.  Earlier, if she still has problems. - Needs to have more frequent meals.   HPI:    Particia Francis is a 57 y.o. female  For follow-up visit Has lost more weight from 83lb to 51 Lb over last 5 months.  Has been to [place] to visit her daughter. Had negative virtual colonoscopy Complains of some chronic abdominal pain.  But lately more on the right upper quadrant.  Gallbladder was normal on virtual colonoscopy. Has been having burning sensation in the lower abdomen as well which he attributes to reflux. Gets better with Pepcid 20 mg p.o. once a day, but then again starts having more in the evening. With occ nausea better with prn zofran. Attributes wt loss to stress/anxiety -moved into her own apartment, gets very little from [employer],  Has alternating diarrhea and constipation Adamantly refuses EGD or colonoscopy. Negative cologuard test (RMA) Diagnosed with IC by urology. Has been to the emergency room multiple times for chronic abdominal pain.  Had multiple CT scans which have been unremarkable.      Past Medical  History:  Diagnosis Date  . Anxiety   . Colitis   . IBS (irritable bowel syndrome)   . Varicose veins of bilateral lower extremities with pain          Past Surgical History:  Procedure Laterality Date  . ABDOMINAL HYSTERECTOMY  04/2016  . CESAREAN SECTION     x 4  . COLONOSCOPY  1995   in [place]         Family History  Problem Relation Age of Onset  . Pancreatic cancer Mother     Social History        Tobacco Use  . Smoking status: Never Smoker  . Smokeless tobacco: Never Used  Substance Use Topics  . Alcohol use: Yes    Comment: minimal  . Drug use: No          Current Outpatient Medications  Medication Sig Dispense Refill  . ALPRAZolam (XANAX) 0.5 MG tablet Take 0.5 mg by mouth 3 (three) times daily.     . Probiotic Product (PROBIOTIC DAILY PO) Take by mouth daily.     No current facility-administered medications for this visit.     No Known Allergies  Review of Systems:  Psychiatric/Behavioral:  Has anxiety or depression     Physical Exam:    Vitals:   03/06/18 1052  Weight: 76 lb (34.5 kg)  Height: 4' 8.5" (1.435 m)   Filed Weights   03/06/18 1052  Weight: 76 lb (34.5 kg)  Constitutional:  Well-developed, in no acute distress.  Very thin built. Psychiatric: Normal mood and affect. Behavior is normal. HEENT: Pupils normal.  Conjunctivae are normal. No scleral icterus. Neck supple.  Cardiovascular: Normal rate, regular rhythm. No edema Pulmonary/chest: Effort normal and breath sounds normal. No wheezing, rales or rhonchi. Abdominal: Soft, nondistended. Nontender. Bowel sounds active throughout. There are no masses palpable. No hepatomegaly. Rectal:  defered Neurological: Alert and oriented to person place and time. Skin: Skin is warm and dry. No rashes noted. Seen in presence of Docia Chuck CMA I spent 15 minutes of face-to-face time with the patient. Greater than 50% of the time was spent  counseling and coordinating care.   Carmell Austria, MD  Cc: Dr Delena Bali

## 2018-03-06 NOTE — Telephone Encounter (Signed)
Please advise 

## 2018-03-08 ENCOUNTER — Ambulatory Visit (HOSPITAL_BASED_OUTPATIENT_CLINIC_OR_DEPARTMENT_OTHER)
Admission: RE | Admit: 2018-03-08 | Discharge: 2018-03-08 | Disposition: A | Discharge status: Home / Self Care | Payer: Medicare Other | Source: Ambulatory Visit | Attending: Gastroenterology | Admitting: Gastroenterology

## 2018-03-08 DIAGNOSIS — R1011 Right upper quadrant pain: Secondary | ICD-10-CM | POA: Insufficient documentation

## 2018-03-08 DIAGNOSIS — R634 Abnormal weight loss: Secondary | ICD-10-CM | POA: Diagnosis not present

## 2018-03-09 NOTE — Telephone Encounter (Signed)
They are all expensive now Can we find out if her insurance covers a 1 or the other

## 2018-03-13 MED ORDER — SUCRALFATE 1 GM/10ML PO SUSP: ORAL | 0 refills | Status: DC

## 2018-03-13 NOTE — Telephone Encounter (Signed)
She just had it done in April 2019. May not need another one. Can try Carafate elixir 1 g p.o. twice daily (at least 2 hours before or after the rest of the medications) x 4 weeks

## 2018-03-13 NOTE — Telephone Encounter (Signed)
Patient said she will buy probiotics OTC. She would like to know if you will order another barium swallow she said she has burning in her stomach.

## 2018-03-13 NOTE — Telephone Encounter (Signed)
Sent medication to patients pharmacy, left patient a message for her to return phone call.

## 2018-03-14 ENCOUNTER — Ambulatory Visit: Payer: Medicare Other | Admitting: Gastroenterology

## 2018-03-14 DIAGNOSIS — N898 Other specified noninflammatory disorders of vagina: Secondary | ICD-10-CM | POA: Diagnosis not present

## 2018-03-14 DIAGNOSIS — K589 Irritable bowel syndrome without diarrhea: Secondary | ICD-10-CM | POA: Diagnosis not present

## 2018-03-14 DIAGNOSIS — Z6822 Body mass index (BMI) 22.0-22.9, adult: Secondary | ICD-10-CM | POA: Diagnosis not present

## 2018-03-14 NOTE — Telephone Encounter (Signed)
Notified patient.

## 2018-03-21 DIAGNOSIS — R634 Abnormal weight loss: Secondary | ICD-10-CM | POA: Diagnosis not present

## 2018-03-21 DIAGNOSIS — K589 Irritable bowel syndrome without diarrhea: Secondary | ICD-10-CM | POA: Diagnosis not present

## 2018-03-21 DIAGNOSIS — Z1339 Encounter for screening examination for other mental health and behavioral disorders: Secondary | ICD-10-CM | POA: Diagnosis not present

## 2018-03-21 DIAGNOSIS — F445 Conversion disorder with seizures or convulsions: Secondary | ICD-10-CM | POA: Diagnosis not present

## 2018-03-30 ENCOUNTER — Telehealth: Payer: Self-pay | Admitting: Gastroenterology

## 2018-03-30 MED ORDER — SUCRALFATE 1 G PO TABS: 1.0000 g | ORAL_TABLET | Freq: Two times a day (BID) | ORAL | 2 refills | Status: DC

## 2018-03-30 NOTE — Telephone Encounter (Signed)
Lets try Carafate twice a day Tell her to let us know in 2 weeks

## 2018-03-30 NOTE — Telephone Encounter (Signed)
She can take the tablets 1 g p.o. twice daily We can call in prescription #60, 2 refills Slurry/suspension works better but unfortunately not covered by insurance.

## 2018-03-30 NOTE — Telephone Encounter (Signed)
Spoke with pt and she is aware, script sent to pharmacy. 

## 2018-03-30 NOTE — Telephone Encounter (Signed)
Pt called to inform that her insurance does not cover carafate in suspension so she never picked up prescription. However, the hospital gave her carafate in tablets 1 gr to take 1 a day. She  Wants to know if Dr. Lyndel Safe is ok with this. She said that her stomach still hurts on and off and she is not gaining weight. She wants to know what next step in treatment is.

## 2018-03-30 NOTE — Telephone Encounter (Signed)
Please see note below and advise  

## 2018-03-30 NOTE — Telephone Encounter (Signed)
She said that her stomach still hurts on and off and she is not gaining weight. She wants to know what next step in treatment is.       Documentation

## 2018-04-06 DIAGNOSIS — Z6822 Body mass index (BMI) 22.0-22.9, adult: Secondary | ICD-10-CM | POA: Diagnosis not present

## 2018-04-06 DIAGNOSIS — M542 Cervicalgia: Secondary | ICD-10-CM | POA: Diagnosis not present

## 2018-04-11 ENCOUNTER — Telehealth: Payer: Self-pay | Admitting: Gastroenterology

## 2018-04-11 DIAGNOSIS — M4003 Postural kyphosis, cervicothoracic region: Secondary | ICD-10-CM | POA: Diagnosis not present

## 2018-04-11 DIAGNOSIS — M4046 Postural lordosis, lumbar region: Secondary | ICD-10-CM | POA: Diagnosis not present

## 2018-04-11 DIAGNOSIS — M542 Cervicalgia: Secondary | ICD-10-CM | POA: Diagnosis not present

## 2018-04-11 DIAGNOSIS — M25512 Pain in left shoulder: Secondary | ICD-10-CM | POA: Diagnosis not present

## 2018-04-12 DIAGNOSIS — R0602 Shortness of breath: Secondary | ICD-10-CM | POA: Diagnosis not present

## 2018-04-12 DIAGNOSIS — J984 Other disorders of lung: Secondary | ICD-10-CM | POA: Diagnosis not present

## 2018-04-12 NOTE — Telephone Encounter (Signed)
Patient called office-spoke with patient-patient reports no difficulty swallowing or any burning in throat, however, she does report chest pain after swallowing "food feels like it is stuck" and abdominal pain; patient reports she is no longer taking the Pepcid (no really working), no longer taking the Zantac (exacerbates her symptoms), and no longer taking the Carafate (it is absorbing her anxiety/pseudoseizure medicine-Xanax and Ativan-and given her migraines); if you are going to want another barium swallow please schedule it to be done at [facility] or if it can be done in [place] at [place], please order it there, or it can be ordered by her PCP and she will ask for the results to be faxed to [facility] once it is completed; patient states she cannot drive in [place] unless she has to because she has anxiety "really bad"; prefers to be seen in office if anything extensive will be ordered;  Please advise

## 2018-04-12 NOTE — Telephone Encounter (Signed)
Left message for patient to call back  

## 2018-04-17 ENCOUNTER — Other Ambulatory Visit: Payer: Self-pay

## 2018-04-17 DIAGNOSIS — R1310 Dysphagia, unspecified: Secondary | ICD-10-CM

## 2018-04-17 MED ORDER — AMBULATORY NON FORMULARY MEDICATION: 10.0000 mL | Freq: Four times a day (QID) | 2 refills | Status: DC | PRN

## 2018-04-17 NOTE — Progress Notes (Signed)
Rx called in to [facility] in [place] due to availability; patient made aware and verbalized understanding of information;

## 2018-04-17 NOTE — Telephone Encounter (Signed)
Please call the patient If she is still having problems we can try GI cocktail (equal amounts of viscous lidocaine, Mylanta, liquid Donnatal or dicyclomine) 10 cc p.o. 4 times daily as needed.  Please call in 120 cc, 2 refills.

## 2018-04-17 NOTE — Telephone Encounter (Signed)
Called and informed the patient of information regarding medication -Rx called into [facility] due to availability; patient aware of change in pharmacy for medication; patient verbalized understanding; patient advised to call back if questions/concerns arise;

## 2018-04-19 DIAGNOSIS — M4046 Postural lordosis, lumbar region: Secondary | ICD-10-CM | POA: Diagnosis not present

## 2018-04-19 DIAGNOSIS — M4003 Postural kyphosis, cervicothoracic region: Secondary | ICD-10-CM | POA: Diagnosis not present

## 2018-04-19 DIAGNOSIS — M542 Cervicalgia: Secondary | ICD-10-CM | POA: Diagnosis not present

## 2018-04-19 DIAGNOSIS — M25512 Pain in left shoulder: Secondary | ICD-10-CM | POA: Diagnosis not present

## 2018-04-21 DIAGNOSIS — R300 Dysuria: Secondary | ICD-10-CM | POA: Diagnosis not present

## 2018-04-21 DIAGNOSIS — F419 Anxiety disorder, unspecified: Secondary | ICD-10-CM | POA: Diagnosis not present

## 2018-04-21 DIAGNOSIS — R634 Abnormal weight loss: Secondary | ICD-10-CM | POA: Diagnosis not present

## 2018-04-21 DIAGNOSIS — Z6822 Body mass index (BMI) 22.0-22.9, adult: Secondary | ICD-10-CM | POA: Diagnosis not present

## 2018-04-28 DIAGNOSIS — Z6823 Body mass index (BMI) 23.0-23.9, adult: Secondary | ICD-10-CM | POA: Diagnosis not present

## 2018-04-28 DIAGNOSIS — M797 Fibromyalgia: Secondary | ICD-10-CM | POA: Diagnosis not present

## 2018-04-28 DIAGNOSIS — F419 Anxiety disorder, unspecified: Secondary | ICD-10-CM | POA: Diagnosis not present

## 2018-05-02 DIAGNOSIS — M4046 Postural lordosis, lumbar region: Secondary | ICD-10-CM | POA: Diagnosis not present

## 2018-05-02 DIAGNOSIS — M25512 Pain in left shoulder: Secondary | ICD-10-CM | POA: Diagnosis not present

## 2018-05-02 DIAGNOSIS — M4003 Postural kyphosis, cervicothoracic region: Secondary | ICD-10-CM | POA: Diagnosis not present

## 2018-05-02 DIAGNOSIS — M542 Cervicalgia: Secondary | ICD-10-CM | POA: Diagnosis not present

## 2018-05-08 ENCOUNTER — Encounter: Payer: Self-pay | Admitting: Gastroenterology

## 2018-05-08 ENCOUNTER — Telehealth: Payer: Self-pay | Admitting: Gastroenterology

## 2018-05-08 ENCOUNTER — Ambulatory Visit (INDEPENDENT_AMBULATORY_CARE_PROVIDER_SITE_OTHER): Payer: Medicare Other | Admitting: Gastroenterology

## 2018-05-08 VITALS — BP 110/64 | HR 112 | Ht <= 58 in | Wt 78.5 lb

## 2018-05-08 DIAGNOSIS — R1013 Epigastric pain: Secondary | ICD-10-CM | POA: Diagnosis not present

## 2018-05-08 DIAGNOSIS — R1310 Dysphagia, unspecified: Secondary | ICD-10-CM | POA: Diagnosis not present

## 2018-05-08 NOTE — Progress Notes (Signed)
Chief Complaint: FU  Referring Provider:  Dr Delena Bali      ASSESSMENT AND PLAN;   #1. Chronic lower abdominal pain since hysterectomy 04/2016. Dx with IC by urology and fibromyalgia.  Multiple CT scans- neg 12/2016, 07/2017.  #2. Weight loss with neg CT 07/27/2017 except for retained stool, CT 12/2016 showed colonic thickening. Normal CBC, CMP, TSH. Neg Virtual colonoscopy 11/22/2017. I believe anxiety playing a significant role.  #3. IBS with alt diarrhea and constipation. Neg stool studies, cologuard test, failed bentyl.  #4. GERD with neg Ba Swallow 08/2017. Failed carafate, GI coctail, pepcid, .  #5. Epigastic  Pain- neg [place] 2019  Plan: - UGI series, also give ba tab (d/t dysphagia).  She would like to get upper GI series performed. - Refuses EGD. She does understand that there is a small but definite risk of missing GI neoplasms. - High cal foods.  More frequent meals. - Record weight q weekly.   - FU in 12 weeks.  Earlier, if she still has problems.   HPI:    Donna Francis is a 57 y.o. female  For follow-up visit Has gained 2 pounds since the last visit Has been eating much better  Could not tolerate any medications including Pepcid, Carafate, GI cocktail, PPIs.  Currently she is on PRN Mylanta and as needed Tums.  Occasional dysphagia, mostly to solids with epigastric pain.  Negative ultrasound  Under considerable stress since Daughter had near fatal accident 02/2018 on [address].  Wt Readings from Last 3 Encounters:  05/08/18 78 lb 8 oz (35.6 kg)  03/06/18 76 lb (34.5 kg)  10/18/17 84 lb 6.4 oz (38.3 kg)          Past Medical History:  Diagnosis Date  . Anxiety   . Colitis   . IBS (irritable bowel syndrome)   . Varicose veins of bilateral lower extremities with pain          Past Surgical History:  Procedure Laterality Date  . ABDOMINAL HYSTERECTOMY  04/2016  . CESAREAN SECTION     x 4  . COLONOSCOPY  1995   in [place]      Family History  Problem Relation Age of Onset  . Pancreatic cancer Mother     Social History        Tobacco Use  . Smoking status: Never Smoker  . Smokeless tobacco: Never Used  Substance Use Topics  . Alcohol use: Yes    Comment: minimal  . Drug use: No          Current Outpatient Medications  Medication Sig Dispense Refill  . ALPRAZolam (XANAX) 0.5 MG tablet Take 0.5 mg by mouth 3 (three) times daily.     . Probiotic Product (PROBIOTIC DAILY PO) Take by mouth daily.     No current facility-administered medications for this visit.     No Known Allergies  Review of Systems:  Psychiatric/Behavioral:  Has anxiety or depression     Physical Exam:    Vitals:   05/08/18 1026  Weight: 78 lb 8 oz (35.6 kg)  Height: 4' 8.5" (1.435 m)   Filed Weights   05/08/18 1026  Weight: 78 lb 8 oz (35.6 kg)      Constitutional:  Well-developed, in no acute distress.  Very thin built. Psychiatric: Normal mood and affect. Behavior is normal. HEENT: Pupils normal.  Conjunctivae are normal. No scleral icterus. Neck supple.  Cardiovascular: Normal rate, regular rhythm. No edema Pulmonary/chest: Effort normal and breath  sounds normal. No wheezing, rales or rhonchi. Abdominal: Soft, nondistended. Nontender. Bowel sounds active throughout. There are no masses palpable. No hepatomegaly. Rectal:  defered Neurological: Alert and oriented to person place and time. Skin: Skin is warm and dry. No rashes noted. Seen in presence of Docia Chuck CMA I spent 25 minutes of face-to-face time with the patient. Greater than 50% of the time was spent counseling and coordinating care.   Carmell Austria, MD  Cc: Dr Delena Bali

## 2018-05-08 NOTE — Patient Instructions (Signed)
If you are age 55 or older, your body mass index should be between 23-30. Your Body mass index is 17.29 kg/m. If this is out of the aforementioned range listed, please consider follow up with your Primary Care Provider.  If you are age 39 or younger, your body mass index should be between 19-25. Your Body mass index is 17.29 kg/m. If this is out of the aformentioned range listed, please consider follow up with your Primary Care Provider.   You have been scheduled for an Upper GI Series and Small Bowel Follow Thru at [facility]. Your appointment is on 05/18/18 at 11am. Please arrive 15 minutes prior to your test for registration. Make certain not to have anything to eat or drink after midnight on the night before your test. If you need to reschedule, please contact radiology at [phone]. --------------------------------------------------------------------------------------------------------------- An upper GI series uses x rays to help diagnose problems of the upper GI tract, which includes the esophagus, stomach, and duodenum. The duodenum is the first part of the small intestine. An upper GI series is conducted by a radiology technologist or a radiologist-a doctor who specializes in x-ray imaging-at a hospital or outpatient center. While sitting or standing in front of an x-ray machine, the patient drinks barium liquid, which is often white and has a chalky consistency and taste. The barium liquid coats the lining of the upper GI tract and makes signs of disease show up more clearly on x rays. X-ray video, called fluoroscopy, is used to view the barium liquid moving through the esophagus, stomach, and duodenum. Additional x rays and fluoroscopy are performed while the patient lies on an x-ray table. To fully coat the upper GI tract with barium liquid, the technologist or radiologist may press on the abdomen or ask the patient to change position. Patients hold still in various positions, allowing the  technologist or radiologist to take x rays of the upper GI tract at different angles. If a technologist conducts the upper GI series, a radiologist will later examine the images to look for problems.  This test typically takes about 1 hour to complete --------------------------------------------------------------------------------------------------------------------------------------------- The Small Bowel Follow Thru examination is used to visualize the entire small bowel (intestines); specifically the connection between the small and large intestine. You will be positioned on a flat x-ray table and an image of your abdomen taken. Then the technologist will show the x-ray to the radiologist. The radiologist will instruct your technologist how much (1-2 cups) barium sulfate you will drink and when to begin taking the timed x-rays, usually 15-30 minutes after you begin drinking. Barium is a harmless substance that will highlight your small intestine by absorbing x-ray. The taste is chalky and it feels very heavy both in the cup and in your stomach.  After the first x-ray is taken and shown to the radiologist, he/she will determine when the next image is to be taken. This is repeated until the barium has reached the end of the small intestine and enters the beginning of the colon (cecum). At such time when the barium spills into the colon, you will be positioned on the x-ray table once again. The radiologist will use a fluoroscopic camera to take some detailed pictures of the connection between your small intestine and colon. The fluoroscope is an x-ray unit that works with a television/computer screen. The radiologist will apply pressure to your abdomen with his/her hand and a lead glove, a plastic paddle, or a paddle with an inflated rubber balloon  on the end. This is to spread apart your loops of intestine so he/she can see all areas.   This test typically takes around 1 hour to complete.  Important  Drink  plenty of water (8-10 cups/day) for a few days following the procedure to avoid constipation and blockage. The barium will make your stools white for a few days. --------------------------------------------------------------------------------------------------------------------------------------------  Thank you,  Dr. Jackquline Denmark

## 2018-05-09 ENCOUNTER — Telehealth: Payer: Self-pay | Admitting: Gastroenterology

## 2018-05-09 NOTE — Telephone Encounter (Signed)
Pt called wanting to know if dr. Lyndel Safe will refer her to [facility] outpatient for her Barium swallow. She states she has anxiety and riding around in [place] she will get confused.

## 2018-05-09 NOTE — Telephone Encounter (Signed)
Patient scheduled at [facility] on 05/12/18 at [place]. Patient notified.

## 2018-05-10 DIAGNOSIS — J069 Acute upper respiratory infection, unspecified: Secondary | ICD-10-CM | POA: Diagnosis not present

## 2018-05-10 DIAGNOSIS — R6889 Other general symptoms and signs: Secondary | ICD-10-CM | POA: Diagnosis not present

## 2018-05-10 DIAGNOSIS — R350 Frequency of micturition: Secondary | ICD-10-CM | POA: Diagnosis not present

## 2018-05-10 DIAGNOSIS — J029 Acute pharyngitis, unspecified: Secondary | ICD-10-CM | POA: Diagnosis not present

## 2018-05-10 NOTE — Telephone Encounter (Signed)
As per previous charting=Patient scheduled at [facility] on 05/12/18 at [place]. Patient is aware of appt;

## 2018-05-12 NOTE — Telephone Encounter (Signed)
Called and spoke with Santiago Glad at [facility], advised of clarification of procedure order - patient is to have a ugi small bowel (looking at esophagus, stomach, and small intestines) with barium tablet as well;  Santiago Glad verified understanding of order and made sure correct radiology request was in system; patient is aware of appt date/time; patient made aware by [facility];

## 2018-05-15 DIAGNOSIS — R1013 Epigastric pain: Secondary | ICD-10-CM | POA: Diagnosis not present

## 2018-05-15 DIAGNOSIS — F419 Anxiety disorder, unspecified: Secondary | ICD-10-CM | POA: Diagnosis not present

## 2018-05-15 DIAGNOSIS — M797 Fibromyalgia: Secondary | ICD-10-CM | POA: Diagnosis not present

## 2018-05-15 DIAGNOSIS — K449 Diaphragmatic hernia without obstruction or gangrene: Secondary | ICD-10-CM | POA: Diagnosis not present

## 2018-05-15 DIAGNOSIS — Z6823 Body mass index (BMI) 23.0-23.9, adult: Secondary | ICD-10-CM | POA: Diagnosis not present

## 2018-05-18 ENCOUNTER — Ambulatory Visit (HOSPITAL_COMMUNITY): Payer: Medicare Other

## 2018-05-22 DIAGNOSIS — J029 Acute pharyngitis, unspecified: Secondary | ICD-10-CM | POA: Diagnosis not present

## 2018-05-22 DIAGNOSIS — Z6823 Body mass index (BMI) 23.0-23.9, adult: Secondary | ICD-10-CM | POA: Diagnosis not present

## 2018-05-23 ENCOUNTER — Telehealth: Payer: Self-pay | Admitting: Gastroenterology

## 2018-05-23 DIAGNOSIS — M25512 Pain in left shoulder: Secondary | ICD-10-CM | POA: Diagnosis not present

## 2018-05-23 DIAGNOSIS — M542 Cervicalgia: Secondary | ICD-10-CM | POA: Diagnosis not present

## 2018-05-23 DIAGNOSIS — M4046 Postural lordosis, lumbar region: Secondary | ICD-10-CM | POA: Diagnosis not present

## 2018-05-23 DIAGNOSIS — M4003 Postural kyphosis, cervicothoracic region: Secondary | ICD-10-CM | POA: Diagnosis not present

## 2018-05-23 NOTE — Telephone Encounter (Signed)
Please advise of results to relay to the patient once the office receives the records from [facility] and you have had time to review them-

## 2018-05-23 NOTE — Telephone Encounter (Signed)
I have not, I have faxed over a request for the records.

## 2018-05-23 NOTE — Telephone Encounter (Signed)
Donna Francis, Have you received these results from [facility]

## 2018-05-23 NOTE — Telephone Encounter (Signed)
Pt would like a CB to discuss radiology results from [facility].

## 2018-05-29 NOTE — Telephone Encounter (Signed)
Please see previous message and advise 

## 2018-05-29 NOTE — Telephone Encounter (Signed)
Patient states she has still not heard results from Barium swallow and would like to speak with the nurse and get results and advice due to still having symptoms.

## 2018-05-30 DIAGNOSIS — M4003 Postural kyphosis, cervicothoracic region: Secondary | ICD-10-CM | POA: Diagnosis not present

## 2018-05-30 DIAGNOSIS — M542 Cervicalgia: Secondary | ICD-10-CM | POA: Diagnosis not present

## 2018-05-30 DIAGNOSIS — M4046 Postural lordosis, lumbar region: Secondary | ICD-10-CM | POA: Diagnosis not present

## 2018-05-30 DIAGNOSIS — M25512 Pain in left shoulder: Secondary | ICD-10-CM | POA: Diagnosis not present

## 2018-05-30 NOTE — Telephone Encounter (Signed)
Barium swallow/upper GI with small bowel follow-through on 05/15/2018-[facility]-mild to moderate gastroesophageal reflux without any stricture.  Normal stomach and small bowel.  Please inform the patient. Report sent for scanning.

## 2018-05-30 NOTE — Telephone Encounter (Signed)
They are on your desk.

## 2018-05-30 NOTE — Telephone Encounter (Signed)
Pt is calling back and said she has been waiting on a call back regarding her results and would like a call back today if possible

## 2018-05-30 NOTE — Telephone Encounter (Signed)
Patient has been notified

## 2018-05-30 NOTE — Telephone Encounter (Signed)
Did we get results of barium swallow? Please let me know later on today

## 2018-06-02 DIAGNOSIS — M4046 Postural lordosis, lumbar region: Secondary | ICD-10-CM | POA: Diagnosis not present

## 2018-06-02 DIAGNOSIS — M542 Cervicalgia: Secondary | ICD-10-CM | POA: Diagnosis not present

## 2018-06-02 DIAGNOSIS — M25512 Pain in left shoulder: Secondary | ICD-10-CM | POA: Diagnosis not present

## 2018-06-02 DIAGNOSIS — M4003 Postural kyphosis, cervicothoracic region: Secondary | ICD-10-CM | POA: Diagnosis not present

## 2018-06-05 DIAGNOSIS — K589 Irritable bowel syndrome without diarrhea: Secondary | ICD-10-CM | POA: Diagnosis not present

## 2018-06-05 DIAGNOSIS — R109 Unspecified abdominal pain: Secondary | ICD-10-CM | POA: Diagnosis not present

## 2018-06-05 DIAGNOSIS — Z6824 Body mass index (BMI) 24.0-24.9, adult: Secondary | ICD-10-CM | POA: Diagnosis not present

## 2018-06-05 DIAGNOSIS — N390 Urinary tract infection, site not specified: Secondary | ICD-10-CM | POA: Diagnosis not present

## 2018-06-07 ENCOUNTER — Telehealth: Payer: Self-pay | Admitting: Gastroenterology

## 2018-06-07 DIAGNOSIS — R1013 Epigastric pain: Secondary | ICD-10-CM

## 2018-06-08 NOTE — Telephone Encounter (Signed)
Called and spoke with patient-patient reports she has been having severe RUQ abdominal pain that radiates into the right shoulder and down the right leg/bloating/nausea/having bowel movements (2-3 times per day-not constipation nor diarrhea); patient reports she has been taking her probiotic, increased po intake/appropriate diet intake/taking Mylants and Tums for symptoms;  Patient is requesting if the barium swallow contrast from 05/15/18 at [facility] could be causing these symptoms or could there be an alternative reason; Please advise

## 2018-06-08 NOTE — Telephone Encounter (Signed)
Please advise on plan of care for this patient-uncertain as to what needs to be instructed/informed;

## 2018-06-08 NOTE — Telephone Encounter (Signed)
Spoke to patient on the phone in detail for 20 minutes. Patient is very concerned and requesting to speak with Dr.Gupta about the pain and symptoms she is having.

## 2018-06-09 DIAGNOSIS — M4046 Postural lordosis, lumbar region: Secondary | ICD-10-CM | POA: Diagnosis not present

## 2018-06-09 DIAGNOSIS — M4003 Postural kyphosis, cervicothoracic region: Secondary | ICD-10-CM | POA: Diagnosis not present

## 2018-06-09 DIAGNOSIS — M542 Cervicalgia: Secondary | ICD-10-CM | POA: Diagnosis not present

## 2018-06-09 DIAGNOSIS — M25512 Pain in left shoulder: Secondary | ICD-10-CM | POA: Diagnosis not present

## 2018-06-13 ENCOUNTER — Other Ambulatory Visit: Payer: Self-pay

## 2018-06-13 DIAGNOSIS — R1013 Epigastric pain: Secondary | ICD-10-CM

## 2018-06-13 DIAGNOSIS — K589 Irritable bowel syndrome without diarrhea: Secondary | ICD-10-CM

## 2018-06-13 DIAGNOSIS — R1084 Generalized abdominal pain: Secondary | ICD-10-CM

## 2018-06-13 MED ORDER — FAMOTIDINE 20 MG PO TABS: 20.0000 mg | ORAL_TABLET | Freq: Two times a day (BID) | ORAL | 3 refills | Status: DC

## 2018-06-13 NOTE — Telephone Encounter (Signed)
Lets try Pepcid 20 mg p.o. twice daily Get x-ray KUB-2 view-supine and upright-if still with problems.

## 2018-06-13 NOTE — Telephone Encounter (Signed)
Called and spoke with patient-patient informed of MD recommendations; patient is agreeable with plan of care and reports she has the Pepcid at home and with refills-patient informed of Pepcid being ordered as 20 mg for BID dosing and patient verbalized understanding of information/instructions;  Patient was advised to call the office at [phone] if questions/concerns arise;  KUB xray 2 view-supine and upright order placed into Epic; patient will be notified by [facility] when to come for xray;  RX for Pepcid 20 mg submitted to pharmacy of patient's choice;

## 2018-06-16 DIAGNOSIS — M25512 Pain in left shoulder: Secondary | ICD-10-CM | POA: Diagnosis not present

## 2018-06-16 DIAGNOSIS — M4003 Postural kyphosis, cervicothoracic region: Secondary | ICD-10-CM | POA: Diagnosis not present

## 2018-06-16 DIAGNOSIS — M542 Cervicalgia: Secondary | ICD-10-CM | POA: Diagnosis not present

## 2018-06-16 DIAGNOSIS — M4046 Postural lordosis, lumbar region: Secondary | ICD-10-CM | POA: Diagnosis not present

## 2018-06-19 DIAGNOSIS — R109 Unspecified abdominal pain: Secondary | ICD-10-CM | POA: Diagnosis not present

## 2018-06-19 DIAGNOSIS — E162 Hypoglycemia, unspecified: Secondary | ICD-10-CM | POA: Diagnosis not present

## 2018-06-19 DIAGNOSIS — G8929 Other chronic pain: Secondary | ICD-10-CM | POA: Diagnosis not present

## 2018-06-19 DIAGNOSIS — E161 Other hypoglycemia: Secondary | ICD-10-CM | POA: Diagnosis not present

## 2018-06-19 DIAGNOSIS — R1084 Generalized abdominal pain: Secondary | ICD-10-CM | POA: Diagnosis not present

## 2018-06-19 DIAGNOSIS — R569 Unspecified convulsions: Secondary | ICD-10-CM | POA: Diagnosis not present

## 2018-06-19 DIAGNOSIS — G4089 Other seizures: Secondary | ICD-10-CM | POA: Diagnosis not present

## 2018-06-22 DIAGNOSIS — Z6823 Body mass index (BMI) 23.0-23.9, adult: Secondary | ICD-10-CM | POA: Diagnosis not present

## 2018-06-22 DIAGNOSIS — R109 Unspecified abdominal pain: Secondary | ICD-10-CM | POA: Diagnosis not present

## 2018-06-22 DIAGNOSIS — K582 Mixed irritable bowel syndrome: Secondary | ICD-10-CM | POA: Diagnosis not present

## 2018-06-28 DIAGNOSIS — M25512 Pain in left shoulder: Secondary | ICD-10-CM | POA: Diagnosis not present

## 2018-06-28 DIAGNOSIS — M4046 Postural lordosis, lumbar region: Secondary | ICD-10-CM | POA: Diagnosis not present

## 2018-06-28 DIAGNOSIS — M4003 Postural kyphosis, cervicothoracic region: Secondary | ICD-10-CM | POA: Diagnosis not present

## 2018-06-28 DIAGNOSIS — M542 Cervicalgia: Secondary | ICD-10-CM | POA: Diagnosis not present

## 2018-07-06 DIAGNOSIS — M542 Cervicalgia: Secondary | ICD-10-CM | POA: Diagnosis not present

## 2018-07-06 DIAGNOSIS — M25512 Pain in left shoulder: Secondary | ICD-10-CM | POA: Diagnosis not present

## 2018-07-06 DIAGNOSIS — M4003 Postural kyphosis, cervicothoracic region: Secondary | ICD-10-CM | POA: Diagnosis not present

## 2018-07-06 DIAGNOSIS — M4046 Postural lordosis, lumbar region: Secondary | ICD-10-CM | POA: Diagnosis not present

## 2018-07-18 DIAGNOSIS — M25512 Pain in left shoulder: Secondary | ICD-10-CM | POA: Diagnosis not present

## 2018-07-18 DIAGNOSIS — M4046 Postural lordosis, lumbar region: Secondary | ICD-10-CM | POA: Diagnosis not present

## 2018-07-18 DIAGNOSIS — M542 Cervicalgia: Secondary | ICD-10-CM | POA: Diagnosis not present

## 2018-07-18 DIAGNOSIS — M4003 Postural kyphosis, cervicothoracic region: Secondary | ICD-10-CM | POA: Diagnosis not present

## 2018-07-20 DIAGNOSIS — M797 Fibromyalgia: Secondary | ICD-10-CM | POA: Diagnosis not present

## 2018-07-20 DIAGNOSIS — F419 Anxiety disorder, unspecified: Secondary | ICD-10-CM | POA: Diagnosis not present

## 2018-07-20 DIAGNOSIS — Z6823 Body mass index (BMI) 23.0-23.9, adult: Secondary | ICD-10-CM | POA: Diagnosis not present

## 2018-07-20 DIAGNOSIS — K582 Mixed irritable bowel syndrome: Secondary | ICD-10-CM | POA: Diagnosis not present

## 2018-07-25 DIAGNOSIS — M4003 Postural kyphosis, cervicothoracic region: Secondary | ICD-10-CM | POA: Diagnosis not present

## 2018-07-25 DIAGNOSIS — M25512 Pain in left shoulder: Secondary | ICD-10-CM | POA: Diagnosis not present

## 2018-07-25 DIAGNOSIS — M4046 Postural lordosis, lumbar region: Secondary | ICD-10-CM | POA: Diagnosis not present

## 2018-07-25 DIAGNOSIS — M542 Cervicalgia: Secondary | ICD-10-CM | POA: Diagnosis not present

## 2018-08-11 DIAGNOSIS — N9089 Other specified noninflammatory disorders of vulva and perineum: Secondary | ICD-10-CM | POA: Diagnosis not present

## 2018-08-11 DIAGNOSIS — Z6823 Body mass index (BMI) 23.0-23.9, adult: Secondary | ICD-10-CM | POA: Diagnosis not present

## 2018-12-04 DIAGNOSIS — R109 Unspecified abdominal pain: Secondary | ICD-10-CM | POA: Diagnosis not present

## 2018-12-04 DIAGNOSIS — R350 Frequency of micturition: Secondary | ICD-10-CM | POA: Diagnosis not present

## 2018-12-04 DIAGNOSIS — F419 Anxiety disorder, unspecified: Secondary | ICD-10-CM | POA: Diagnosis not present

## 2018-12-05 DIAGNOSIS — R350 Frequency of micturition: Secondary | ICD-10-CM | POA: Diagnosis not present

## 2018-12-05 DIAGNOSIS — N390 Urinary tract infection, site not specified: Secondary | ICD-10-CM | POA: Diagnosis not present

## 2018-12-05 DIAGNOSIS — E538 Deficiency of other specified B group vitamins: Secondary | ICD-10-CM | POA: Diagnosis not present

## 2018-12-05 DIAGNOSIS — R109 Unspecified abdominal pain: Secondary | ICD-10-CM | POA: Diagnosis not present

## 2018-12-12 DIAGNOSIS — R634 Abnormal weight loss: Secondary | ICD-10-CM | POA: Diagnosis not present

## 2018-12-19 DIAGNOSIS — M25519 Pain in unspecified shoulder: Secondary | ICD-10-CM | POA: Diagnosis not present

## 2018-12-19 DIAGNOSIS — R000 Tachycardia, unspecified: Secondary | ICD-10-CM | POA: Diagnosis not present

## 2018-12-19 DIAGNOSIS — R1084 Generalized abdominal pain: Secondary | ICD-10-CM | POA: Diagnosis not present

## 2018-12-19 DIAGNOSIS — N390 Urinary tract infection, site not specified: Secondary | ICD-10-CM | POA: Diagnosis not present

## 2018-12-19 DIAGNOSIS — R531 Weakness: Secondary | ICD-10-CM | POA: Diagnosis not present

## 2018-12-19 DIAGNOSIS — J984 Other disorders of lung: Secondary | ICD-10-CM | POA: Diagnosis not present

## 2018-12-19 DIAGNOSIS — R569 Unspecified convulsions: Secondary | ICD-10-CM | POA: Diagnosis not present

## 2018-12-19 DIAGNOSIS — I361 Nonrheumatic tricuspid (valve) insufficiency: Secondary | ICD-10-CM | POA: Diagnosis not present

## 2018-12-19 DIAGNOSIS — R52 Pain, unspecified: Secondary | ICD-10-CM | POA: Diagnosis not present

## 2018-12-19 DIAGNOSIS — R51 Headache: Secondary | ICD-10-CM | POA: Diagnosis not present

## 2018-12-20 DIAGNOSIS — N390 Urinary tract infection, site not specified: Secondary | ICD-10-CM | POA: Diagnosis not present

## 2018-12-20 DIAGNOSIS — K219 Gastro-esophageal reflux disease without esophagitis: Secondary | ICD-10-CM | POA: Diagnosis not present

## 2018-12-20 DIAGNOSIS — Z79899 Other long term (current) drug therapy: Secondary | ICD-10-CM | POA: Diagnosis not present

## 2018-12-20 DIAGNOSIS — R634 Abnormal weight loss: Secondary | ICD-10-CM | POA: Diagnosis present

## 2018-12-20 DIAGNOSIS — J984 Other disorders of lung: Secondary | ICD-10-CM | POA: Diagnosis not present

## 2018-12-20 DIAGNOSIS — R000 Tachycardia, unspecified: Secondary | ICD-10-CM | POA: Diagnosis present

## 2018-12-20 DIAGNOSIS — R4182 Altered mental status, unspecified: Secondary | ICD-10-CM | POA: Diagnosis not present

## 2018-12-20 DIAGNOSIS — M069 Rheumatoid arthritis, unspecified: Secondary | ICD-10-CM | POA: Diagnosis present

## 2018-12-20 DIAGNOSIS — F319 Bipolar disorder, unspecified: Secondary | ICD-10-CM | POA: Diagnosis present

## 2018-12-20 DIAGNOSIS — M797 Fibromyalgia: Secondary | ICD-10-CM | POA: Diagnosis present

## 2018-12-20 DIAGNOSIS — R569 Unspecified convulsions: Secondary | ICD-10-CM | POA: Diagnosis not present

## 2018-12-20 DIAGNOSIS — E440 Moderate protein-calorie malnutrition: Secondary | ICD-10-CM | POA: Diagnosis not present

## 2018-12-20 DIAGNOSIS — Z792 Long term (current) use of antibiotics: Secondary | ICD-10-CM | POA: Diagnosis not present

## 2018-12-20 DIAGNOSIS — Z681 Body mass index (BMI) 19 or less, adult: Secondary | ICD-10-CM | POA: Diagnosis not present

## 2018-12-20 DIAGNOSIS — R42 Dizziness and giddiness: Secondary | ICD-10-CM | POA: Diagnosis present

## 2018-12-20 DIAGNOSIS — R29701 NIHSS score 1: Secondary | ICD-10-CM | POA: Diagnosis present

## 2018-12-20 DIAGNOSIS — B955 Unspecified streptococcus as the cause of diseases classified elsewhere: Secondary | ICD-10-CM | POA: Diagnosis present

## 2018-12-20 DIAGNOSIS — Z8719 Personal history of other diseases of the digestive system: Secondary | ICD-10-CM | POA: Diagnosis not present

## 2018-12-20 DIAGNOSIS — E559 Vitamin D deficiency, unspecified: Secondary | ICD-10-CM | POA: Diagnosis present

## 2018-12-20 DIAGNOSIS — R531 Weakness: Secondary | ICD-10-CM | POA: Diagnosis not present

## 2018-12-20 DIAGNOSIS — R51 Headache: Secondary | ICD-10-CM | POA: Diagnosis not present

## 2018-12-20 DIAGNOSIS — F419 Anxiety disorder, unspecified: Secondary | ICD-10-CM | POA: Diagnosis present

## 2018-12-20 DIAGNOSIS — R627 Adult failure to thrive: Secondary | ICD-10-CM | POA: Diagnosis present

## 2018-12-21 DIAGNOSIS — R569 Unspecified convulsions: Secondary | ICD-10-CM | POA: Diagnosis not present

## 2018-12-21 DIAGNOSIS — N390 Urinary tract infection, site not specified: Secondary | ICD-10-CM | POA: Diagnosis not present

## 2018-12-22 DIAGNOSIS — R569 Unspecified convulsions: Secondary | ICD-10-CM | POA: Diagnosis not present

## 2018-12-22 DIAGNOSIS — N390 Urinary tract infection, site not specified: Secondary | ICD-10-CM | POA: Diagnosis not present

## 2018-12-25 DIAGNOSIS — Z87440 Personal history of urinary (tract) infections: Secondary | ICD-10-CM | POA: Diagnosis not present

## 2018-12-25 DIAGNOSIS — R1013 Epigastric pain: Secondary | ICD-10-CM | POA: Diagnosis not present

## 2018-12-25 DIAGNOSIS — M069 Rheumatoid arthritis, unspecified: Secondary | ICD-10-CM | POA: Diagnosis not present

## 2018-12-25 DIAGNOSIS — Z8669 Personal history of other diseases of the nervous system and sense organs: Secondary | ICD-10-CM | POA: Diagnosis not present

## 2018-12-25 DIAGNOSIS — R05 Cough: Secondary | ICD-10-CM | POA: Diagnosis not present

## 2018-12-25 DIAGNOSIS — R079 Chest pain, unspecified: Secondary | ICD-10-CM | POA: Diagnosis not present

## 2018-12-25 DIAGNOSIS — Z79899 Other long term (current) drug therapy: Secondary | ICD-10-CM | POA: Diagnosis not present

## 2018-12-25 DIAGNOSIS — R1084 Generalized abdominal pain: Secondary | ICD-10-CM | POA: Diagnosis not present

## 2018-12-25 DIAGNOSIS — R000 Tachycardia, unspecified: Secondary | ICD-10-CM | POA: Diagnosis not present

## 2018-12-25 DIAGNOSIS — Z792 Long term (current) use of antibiotics: Secondary | ICD-10-CM | POA: Diagnosis not present

## 2018-12-25 DIAGNOSIS — K219 Gastro-esophageal reflux disease without esophagitis: Secondary | ICD-10-CM | POA: Diagnosis not present

## 2018-12-25 DIAGNOSIS — R1011 Right upper quadrant pain: Secondary | ICD-10-CM | POA: Diagnosis not present

## 2018-12-28 DIAGNOSIS — F419 Anxiety disorder, unspecified: Secondary | ICD-10-CM | POA: Diagnosis not present

## 2018-12-28 DIAGNOSIS — N390 Urinary tract infection, site not specified: Secondary | ICD-10-CM | POA: Diagnosis not present

## 2018-12-28 DIAGNOSIS — Z79899 Other long term (current) drug therapy: Secondary | ICD-10-CM | POA: Diagnosis not present

## 2018-12-28 DIAGNOSIS — F445 Conversion disorder with seizures or convulsions: Secondary | ICD-10-CM | POA: Diagnosis not present

## 2018-12-28 DIAGNOSIS — R634 Abnormal weight loss: Secondary | ICD-10-CM | POA: Diagnosis not present

## 2019-01-01 DIAGNOSIS — F419 Anxiety disorder, unspecified: Secondary | ICD-10-CM | POA: Diagnosis not present

## 2019-01-01 DIAGNOSIS — R251 Tremor, unspecified: Secondary | ICD-10-CM | POA: Diagnosis not present

## 2019-01-01 DIAGNOSIS — R0602 Shortness of breath: Secondary | ICD-10-CM | POA: Diagnosis not present

## 2019-01-01 DIAGNOSIS — Z209 Contact with and (suspected) exposure to unspecified communicable disease: Secondary | ICD-10-CM | POA: Diagnosis not present

## 2019-01-01 DIAGNOSIS — R531 Weakness: Secondary | ICD-10-CM | POA: Diagnosis not present

## 2019-01-01 DIAGNOSIS — R42 Dizziness and giddiness: Secondary | ICD-10-CM | POA: Diagnosis not present

## 2019-01-01 DIAGNOSIS — R000 Tachycardia, unspecified: Secondary | ICD-10-CM | POA: Diagnosis not present

## 2019-01-03 DIAGNOSIS — F319 Bipolar disorder, unspecified: Secondary | ICD-10-CM | POA: Diagnosis not present

## 2019-01-03 DIAGNOSIS — R5381 Other malaise: Secondary | ICD-10-CM | POA: Diagnosis not present

## 2019-01-03 DIAGNOSIS — E440 Moderate protein-calorie malnutrition: Secondary | ICD-10-CM | POA: Diagnosis not present

## 2019-01-03 DIAGNOSIS — R531 Weakness: Secondary | ICD-10-CM | POA: Diagnosis not present

## 2019-01-03 DIAGNOSIS — Z79899 Other long term (current) drug therapy: Secondary | ICD-10-CM | POA: Diagnosis not present

## 2019-01-03 DIAGNOSIS — Z792 Long term (current) use of antibiotics: Secondary | ICD-10-CM | POA: Diagnosis not present

## 2019-01-03 DIAGNOSIS — K589 Irritable bowel syndrome without diarrhea: Secondary | ICD-10-CM | POA: Diagnosis not present

## 2019-01-03 DIAGNOSIS — R627 Adult failure to thrive: Secondary | ICD-10-CM | POA: Diagnosis not present

## 2019-01-03 DIAGNOSIS — M797 Fibromyalgia: Secondary | ICD-10-CM | POA: Diagnosis not present

## 2019-01-03 DIAGNOSIS — M069 Rheumatoid arthritis, unspecified: Secondary | ICD-10-CM | POA: Diagnosis not present

## 2019-01-03 DIAGNOSIS — R634 Abnormal weight loss: Secondary | ICD-10-CM | POA: Diagnosis not present

## 2019-01-03 DIAGNOSIS — F329 Major depressive disorder, single episode, unspecified: Secondary | ICD-10-CM | POA: Diagnosis not present

## 2019-01-03 DIAGNOSIS — N390 Urinary tract infection, site not specified: Secondary | ICD-10-CM | POA: Diagnosis not present

## 2019-01-03 DIAGNOSIS — F419 Anxiety disorder, unspecified: Secondary | ICD-10-CM | POA: Diagnosis not present

## 2019-01-03 DIAGNOSIS — E559 Vitamin D deficiency, unspecified: Secondary | ICD-10-CM | POA: Diagnosis not present

## 2019-01-03 DIAGNOSIS — K219 Gastro-esophageal reflux disease without esophagitis: Secondary | ICD-10-CM | POA: Diagnosis not present

## 2019-01-03 DIAGNOSIS — B955 Unspecified streptococcus as the cause of diseases classified elsewhere: Secondary | ICD-10-CM | POA: Diagnosis not present

## 2019-01-03 DIAGNOSIS — Z90710 Acquired absence of both cervix and uterus: Secondary | ICD-10-CM | POA: Diagnosis not present

## 2019-01-04 ENCOUNTER — Telehealth: Payer: Self-pay | Admitting: Gastroenterology

## 2019-01-05 NOTE — Telephone Encounter (Signed)
She likely has decreased appetite due to multiple medications. Doubt if esophagus is the etiology.  Plan: For now small but more frequent meals. Start boost or any supplements like Ensure twice daily. Monitor weight and record. Follow-up in 12 weeks.  Earlier, if still with problems. Please get the discharge summary from recent [facility] hospitalization.  Thx  RG

## 2019-01-05 NOTE — Telephone Encounter (Signed)
Patient reports she began feeling sick on July 10th-abd pain/diarrhea/headache-was admitted to [facility] the week of 12/11/2018 -patient reports she was having seizures and UTI, had antibx, and was given Protonix; patient discharged and then started having body "spasms"/"paralyzed"; patient reports she had been losing a lot of weight (patient reports weight of 70 lbs) and she is feeling "weak"; patient is having "body" spasms to the point to where she feels like she cannot eat a lot-patient reports she is having a fibromyalgia flare for the last 3 weeks and she wants to know if the esophagus spasms are what is causing her to feel weak and not able to eat a lot? Please advise

## 2019-01-06 DIAGNOSIS — N390 Urinary tract infection, site not specified: Secondary | ICD-10-CM | POA: Diagnosis not present

## 2019-01-06 DIAGNOSIS — F419 Anxiety disorder, unspecified: Secondary | ICD-10-CM | POA: Diagnosis not present

## 2019-01-06 DIAGNOSIS — E440 Moderate protein-calorie malnutrition: Secondary | ICD-10-CM | POA: Diagnosis not present

## 2019-01-06 DIAGNOSIS — M797 Fibromyalgia: Secondary | ICD-10-CM | POA: Diagnosis not present

## 2019-01-06 DIAGNOSIS — M069 Rheumatoid arthritis, unspecified: Secondary | ICD-10-CM | POA: Diagnosis not present

## 2019-01-06 DIAGNOSIS — E559 Vitamin D deficiency, unspecified: Secondary | ICD-10-CM | POA: Diagnosis not present

## 2019-01-08 NOTE — Telephone Encounter (Signed)
Called and spoke with patient-patient informed of MD recommendations; patient is agreeable with plan of care and wants to report her abdominal pain has ceased; patient does report she has had an increase of burping and passing gas;  Patient verbalized understanding of information/instructions;  Patient was advised to call the office at [phone] if questions/concerns arise;  Patient requested to have a virtual appt scheduled to speak with Dr. Lyndel Safe concerning her symptoms-appt scheduled on 01/19/2019 at 3:40 pm;

## 2019-01-09 DIAGNOSIS — F419 Anxiety disorder, unspecified: Secondary | ICD-10-CM | POA: Diagnosis not present

## 2019-01-09 DIAGNOSIS — N390 Urinary tract infection, site not specified: Secondary | ICD-10-CM | POA: Diagnosis not present

## 2019-01-09 DIAGNOSIS — E559 Vitamin D deficiency, unspecified: Secondary | ICD-10-CM | POA: Diagnosis not present

## 2019-01-09 DIAGNOSIS — M797 Fibromyalgia: Secondary | ICD-10-CM | POA: Diagnosis not present

## 2019-01-09 DIAGNOSIS — M069 Rheumatoid arthritis, unspecified: Secondary | ICD-10-CM | POA: Diagnosis not present

## 2019-01-09 DIAGNOSIS — E440 Moderate protein-calorie malnutrition: Secondary | ICD-10-CM | POA: Diagnosis not present

## 2019-01-10 DIAGNOSIS — E440 Moderate protein-calorie malnutrition: Secondary | ICD-10-CM | POA: Diagnosis not present

## 2019-01-10 DIAGNOSIS — F419 Anxiety disorder, unspecified: Secondary | ICD-10-CM | POA: Diagnosis not present

## 2019-01-10 DIAGNOSIS — M069 Rheumatoid arthritis, unspecified: Secondary | ICD-10-CM | POA: Diagnosis not present

## 2019-01-10 DIAGNOSIS — E559 Vitamin D deficiency, unspecified: Secondary | ICD-10-CM | POA: Diagnosis not present

## 2019-01-10 DIAGNOSIS — M797 Fibromyalgia: Secondary | ICD-10-CM | POA: Diagnosis not present

## 2019-01-10 DIAGNOSIS — N390 Urinary tract infection, site not specified: Secondary | ICD-10-CM | POA: Diagnosis not present

## 2019-01-13 DIAGNOSIS — E440 Moderate protein-calorie malnutrition: Secondary | ICD-10-CM | POA: Diagnosis not present

## 2019-01-13 DIAGNOSIS — E559 Vitamin D deficiency, unspecified: Secondary | ICD-10-CM | POA: Diagnosis not present

## 2019-01-13 DIAGNOSIS — M069 Rheumatoid arthritis, unspecified: Secondary | ICD-10-CM | POA: Diagnosis not present

## 2019-01-13 DIAGNOSIS — F419 Anxiety disorder, unspecified: Secondary | ICD-10-CM | POA: Diagnosis not present

## 2019-01-13 DIAGNOSIS — N390 Urinary tract infection, site not specified: Secondary | ICD-10-CM | POA: Diagnosis not present

## 2019-01-13 DIAGNOSIS — M797 Fibromyalgia: Secondary | ICD-10-CM | POA: Diagnosis not present

## 2019-01-15 DIAGNOSIS — M069 Rheumatoid arthritis, unspecified: Secondary | ICD-10-CM | POA: Diagnosis not present

## 2019-01-15 DIAGNOSIS — E440 Moderate protein-calorie malnutrition: Secondary | ICD-10-CM | POA: Diagnosis not present

## 2019-01-15 DIAGNOSIS — N390 Urinary tract infection, site not specified: Secondary | ICD-10-CM | POA: Diagnosis not present

## 2019-01-15 DIAGNOSIS — E559 Vitamin D deficiency, unspecified: Secondary | ICD-10-CM | POA: Diagnosis not present

## 2019-01-15 DIAGNOSIS — F419 Anxiety disorder, unspecified: Secondary | ICD-10-CM | POA: Diagnosis not present

## 2019-01-15 DIAGNOSIS — M797 Fibromyalgia: Secondary | ICD-10-CM | POA: Diagnosis not present

## 2019-01-17 DIAGNOSIS — F419 Anxiety disorder, unspecified: Secondary | ICD-10-CM | POA: Diagnosis not present

## 2019-01-17 DIAGNOSIS — E440 Moderate protein-calorie malnutrition: Secondary | ICD-10-CM | POA: Diagnosis not present

## 2019-01-17 DIAGNOSIS — N390 Urinary tract infection, site not specified: Secondary | ICD-10-CM | POA: Diagnosis not present

## 2019-01-17 DIAGNOSIS — M797 Fibromyalgia: Secondary | ICD-10-CM | POA: Diagnosis not present

## 2019-01-17 DIAGNOSIS — M069 Rheumatoid arthritis, unspecified: Secondary | ICD-10-CM | POA: Diagnosis not present

## 2019-01-17 DIAGNOSIS — E559 Vitamin D deficiency, unspecified: Secondary | ICD-10-CM | POA: Diagnosis not present

## 2019-01-18 DIAGNOSIS — N390 Urinary tract infection, site not specified: Secondary | ICD-10-CM | POA: Diagnosis not present

## 2019-01-18 DIAGNOSIS — F419 Anxiety disorder, unspecified: Secondary | ICD-10-CM | POA: Diagnosis not present

## 2019-01-18 DIAGNOSIS — E440 Moderate protein-calorie malnutrition: Secondary | ICD-10-CM | POA: Diagnosis not present

## 2019-01-18 DIAGNOSIS — M069 Rheumatoid arthritis, unspecified: Secondary | ICD-10-CM | POA: Diagnosis not present

## 2019-01-18 DIAGNOSIS — M797 Fibromyalgia: Secondary | ICD-10-CM | POA: Diagnosis not present

## 2019-01-18 DIAGNOSIS — E559 Vitamin D deficiency, unspecified: Secondary | ICD-10-CM | POA: Diagnosis not present

## 2019-01-19 ENCOUNTER — Other Ambulatory Visit: Payer: Self-pay

## 2019-01-19 ENCOUNTER — Telehealth (INDEPENDENT_AMBULATORY_CARE_PROVIDER_SITE_OTHER): Payer: Medicare Other | Admitting: Gastroenterology

## 2019-01-19 DIAGNOSIS — K219 Gastro-esophageal reflux disease without esophagitis: Secondary | ICD-10-CM | POA: Diagnosis not present

## 2019-01-19 DIAGNOSIS — R634 Abnormal weight loss: Secondary | ICD-10-CM | POA: Diagnosis not present

## 2019-01-19 DIAGNOSIS — K580 Irritable bowel syndrome with diarrhea: Secondary | ICD-10-CM | POA: Diagnosis not present

## 2019-01-19 NOTE — Patient Instructions (Signed)
If you are age 56 or older, your body mass index should be between 23-30. Your Body mass index is 16.52 kg/m. If this is out of the aforementioned range listed, please consider follow up with your Primary Care Provider.  If you are age 72 or younger, your body mass index should be between 19-25. Your Body mass index is 16.52 kg/m. If this is out of the aformentioned range listed, please consider follow up with your Primary Care Provider.   High calorie food and more frequent meals.   Record weight weekly.   Thank you,  Dr. Jackquline Denmark

## 2019-01-19 NOTE — Progress Notes (Signed)
Chief Complaint: FU  Referring Provider:  Dr Delena Bali      ASSESSMENT AND PLAN;   #1. Chronic lower abdominal pain since hysterectomy 04/2016. Dx with IC by urology and fibromyalgia.  Multiple CT scans- neg 12/2016, 07/2017, 12/2018.   #2. Weight loss with neg multiple CTs 07/27/2017, 12/2018. Nl CBC, CMP, TSH. Neg Virtual colonoscopy 11/22/2017. I believe anxiety playing a significant role.  #3. IBS with alt diarrhea and constipation. Neg stool studies, cologuard test, failed bentyl.  #4. GERD with neg Ba Swallow 04/2018. Failed carafate, GI coctail, pepcid.  #5. Epigastic  Pain- neg CT AP, [place] 12/2018, [place] 2019. Barium swallow/upper GI with small bowel follow-through on 05/15/2018-[facility]-mild to moderate gastroesophageal reflux without any stricture.  Normal stomach and small bowel.  Plan: - Refuses EGD. She does understand that there is a small but definite risk of missing GI neoplasms. - High cal foods.  More frequent meals. - Continue Protonix 20mg  po qd, #30, 11 refills. - Can we arrange for Boost or any nutrition supplement 1/day? ([place] [phone]) - Record weight q weekly.   - FU in 12 weeks.  Earlier, if she still has problems.   HPI:    Donna Francis is a 58 y.o. female  For follow-up visit Had multiple visits to ED at [facility] 12/25/2018 to [facility] with epigastric pain.  Negative ultrasound, CT Abdo/pelvis, normal labs including CBC, CMP, lipase.  Given GI cocktail.  Was found to have UTI-treated with Rocephin and then Augmentin.  Adm to [facility] with generalized weakness 01/01/2019-negative CT head, MRI of the head. Dx again with pseudoseizures. Nl CBC, CMP, urine drug screen.  Now at home with home health care.  Told me that she has lost 2 more pounds.  Wants [place] to give nutritional supplements as a prescription since she cannot afford it.  Denies having any further dysphagia.  She had negative upper GI series except for  reflux.  Could not tolerate any medications including Pepcid, Carafate, GI cocktail, most of PPIs.  Currently she is on PRN Mylanta and as needed Tums.  Under considerable stress since Daughter had near fatal accident 02/2018 [place] [address].  Wt Readings from Last 3 Encounters:  01/19/19 75 lb (34 kg)  05/08/18 78 lb 8 oz (35.6 kg)  03/06/18 76 lb (34.5 kg)          Past Medical History:  Diagnosis Date  . Anxiety   . Colitis   . IBS (irritable bowel syndrome)   . Varicose veins of bilateral lower extremities with pain          Past Surgical History:  Procedure Laterality Date  . ABDOMINAL HYSTERECTOMY  04/2016  . CESAREAN SECTION     x 4  . COLONOSCOPY  1995   in [place]         Family History  Problem Relation Age of Onset  . Pancreatic cancer Mother     Social History        Tobacco Use  . Smoking status: Never Smoker  . Smokeless tobacco: Never Used  Substance Use Topics  . Alcohol use: Yes    Comment: minimal  . Drug use: No          Current Outpatient Medications  Medication Sig Dispense Refill  . ALPRAZolam (XANAX) 0.5 MG tablet Take 0.5 mg by mouth 3 (three) times daily.     . Probiotic Product (PROBIOTIC DAILY PO) Take by mouth daily.  No current facility-administered medications for this visit.     No Known Allergies  Review of Systems:  Psychiatric/Behavioral:  Has anxiety or depression     Physical Exam:    Vitals:   01/19/19 0814  Weight: 75 lb (34 kg)  Height: 4' 8.5" (1.435 m)   Filed Weights   01/19/19 0814  Weight: 75 lb (34 kg)   This service was provided via telemedicine.  The patient was located at home.  The provider was located in office.  The patient did consent to this telephone visit and is aware of possible charges through their insurance for this visit.  Time spent on call/coordination of care/review of extensive records (over 50 pages): 25 min    Carmell Austria, MD  Cc: Dr  Delena Bali

## 2019-01-20 DIAGNOSIS — F419 Anxiety disorder, unspecified: Secondary | ICD-10-CM | POA: Diagnosis not present

## 2019-01-20 DIAGNOSIS — M069 Rheumatoid arthritis, unspecified: Secondary | ICD-10-CM | POA: Diagnosis not present

## 2019-01-20 DIAGNOSIS — E440 Moderate protein-calorie malnutrition: Secondary | ICD-10-CM | POA: Diagnosis not present

## 2019-01-20 DIAGNOSIS — E559 Vitamin D deficiency, unspecified: Secondary | ICD-10-CM | POA: Diagnosis not present

## 2019-01-20 DIAGNOSIS — N390 Urinary tract infection, site not specified: Secondary | ICD-10-CM | POA: Diagnosis not present

## 2019-01-20 DIAGNOSIS — M797 Fibromyalgia: Secondary | ICD-10-CM | POA: Diagnosis not present

## 2019-01-24 DIAGNOSIS — F419 Anxiety disorder, unspecified: Secondary | ICD-10-CM | POA: Diagnosis not present

## 2019-01-24 DIAGNOSIS — N390 Urinary tract infection, site not specified: Secondary | ICD-10-CM | POA: Diagnosis not present

## 2019-01-24 DIAGNOSIS — M069 Rheumatoid arthritis, unspecified: Secondary | ICD-10-CM | POA: Diagnosis not present

## 2019-01-24 DIAGNOSIS — E440 Moderate protein-calorie malnutrition: Secondary | ICD-10-CM | POA: Diagnosis not present

## 2019-01-24 DIAGNOSIS — M797 Fibromyalgia: Secondary | ICD-10-CM | POA: Diagnosis not present

## 2019-01-24 DIAGNOSIS — E559 Vitamin D deficiency, unspecified: Secondary | ICD-10-CM | POA: Diagnosis not present

## 2019-01-25 ENCOUNTER — Telehealth: Payer: Self-pay | Admitting: Gastroenterology

## 2019-01-25 DIAGNOSIS — R634 Abnormal weight loss: Secondary | ICD-10-CM

## 2019-01-25 DIAGNOSIS — R1013 Epigastric pain: Secondary | ICD-10-CM

## 2019-01-25 DIAGNOSIS — R1084 Generalized abdominal pain: Secondary | ICD-10-CM

## 2019-01-25 DIAGNOSIS — R1011 Right upper quadrant pain: Secondary | ICD-10-CM

## 2019-01-25 DIAGNOSIS — K589 Irritable bowel syndrome without diarrhea: Secondary | ICD-10-CM

## 2019-01-25 DIAGNOSIS — R1310 Dysphagia, unspecified: Secondary | ICD-10-CM

## 2019-01-25 NOTE — Telephone Encounter (Signed)
Any kind of Boost (has different flavors but same calories) We can order Cologuard.  However, insurance may not cover it since she just had virtual colonoscopy in 2019. We can always try if she wants.  RG

## 2019-01-25 NOTE — Telephone Encounter (Signed)
Dr.Gupta -please review previous message and advise

## 2019-01-26 DIAGNOSIS — E559 Vitamin D deficiency, unspecified: Secondary | ICD-10-CM | POA: Diagnosis not present

## 2019-01-26 DIAGNOSIS — E440 Moderate protein-calorie malnutrition: Secondary | ICD-10-CM | POA: Diagnosis not present

## 2019-01-26 DIAGNOSIS — N390 Urinary tract infection, site not specified: Secondary | ICD-10-CM | POA: Diagnosis not present

## 2019-01-26 DIAGNOSIS — F419 Anxiety disorder, unspecified: Secondary | ICD-10-CM | POA: Diagnosis not present

## 2019-01-26 DIAGNOSIS — M069 Rheumatoid arthritis, unspecified: Secondary | ICD-10-CM | POA: Diagnosis not present

## 2019-01-26 DIAGNOSIS — M797 Fibromyalgia: Secondary | ICD-10-CM | POA: Diagnosis not present

## 2019-01-26 NOTE — Telephone Encounter (Signed)
Please specify "get it done" refers to-COVID antibody test or IgM and IgG testing? (Patient reports she is going to call her PCP to have the antibody testing for COVID)

## 2019-01-26 NOTE — Telephone Encounter (Signed)
Pt is calling states her medical prescription is through [facility] now and was told there is an exceptional request form Dr.Gupta can fill out and send in for patient to get Boost drinks and Probiotics. Patient states she cannot afford it otherwise.

## 2019-01-26 NOTE — Telephone Encounter (Signed)
More than happy to help her in any way Bre, can you please get the appropriate forms.  I will sign those.  Thx  RG

## 2019-01-26 NOTE — Telephone Encounter (Signed)
Called and spoke with patient-patient informed of MD recommendations and is agreeable with plan of care and will obtain Boost for caloric consumption;  patient reports she would like for the office to order the Cologuard just to see if her insurance will cover as she feels it is a necessary step in her care; Cologuard test has been ordered at this time;   Please advise if the patient needs to have the COVID antibody test to see if she had COVID -she was never tested in the 3 times she was admitted to [facility] feels this testing would benefit her;  Patient is also requesting that her medical records from [facility] be sent to her-unfortunately these records have already been sent to [facility] medical records to be scanned into her chart-patient was informed of this;

## 2019-01-26 NOTE — Telephone Encounter (Signed)
Sure Pl get it done IgG/IgM RG

## 2019-02-01 ENCOUNTER — Telehealth: Payer: Self-pay | Admitting: Gastroenterology

## 2019-02-01 NOTE — Telephone Encounter (Signed)
Called and spoke with patient-patient reports she has been experiencing swallowing difficulty and a heaviness in her chest-she took Mylanta; patient also reports she filled her Protonix and took a dose or two and her symptoms have "calmed down a little";  Patient just wants Dr. Lyndel Safe to know that she is now taking the Protonix and the Mylanta (not at the same time) for symptom control/relief;

## 2019-02-01 NOTE — Telephone Encounter (Signed)
PA sent to [facility], it states that the request cannot be done electronically. Patient will need to call Customer Service for assistance first.

## 2019-02-01 NOTE — Telephone Encounter (Signed)
Please find/locate these forms so Dr. Lyndel Safe can fill them out for the patient

## 2019-02-01 NOTE — Telephone Encounter (Signed)
Pt reported that she is having difficulty swallowing and feels a heaviness in her chest.  She would like to discuss medications Protonix and Mylanta.

## 2019-02-02 ENCOUNTER — Telehealth: Payer: Self-pay | Admitting: Gastroenterology

## 2019-02-02 DIAGNOSIS — K589 Irritable bowel syndrome without diarrhea: Secondary | ICD-10-CM | POA: Diagnosis not present

## 2019-02-02 DIAGNOSIS — N390 Urinary tract infection, site not specified: Secondary | ICD-10-CM | POA: Diagnosis not present

## 2019-02-02 DIAGNOSIS — B955 Unspecified streptococcus as the cause of diseases classified elsewhere: Secondary | ICD-10-CM | POA: Diagnosis not present

## 2019-02-02 DIAGNOSIS — K219 Gastro-esophageal reflux disease without esophagitis: Secondary | ICD-10-CM | POA: Diagnosis not present

## 2019-02-02 DIAGNOSIS — R531 Weakness: Secondary | ICD-10-CM | POA: Diagnosis not present

## 2019-02-02 DIAGNOSIS — F319 Bipolar disorder, unspecified: Secondary | ICD-10-CM | POA: Diagnosis not present

## 2019-02-02 DIAGNOSIS — Z79899 Other long term (current) drug therapy: Secondary | ICD-10-CM | POA: Diagnosis not present

## 2019-02-02 DIAGNOSIS — F419 Anxiety disorder, unspecified: Secondary | ICD-10-CM | POA: Diagnosis not present

## 2019-02-02 DIAGNOSIS — E440 Moderate protein-calorie malnutrition: Secondary | ICD-10-CM | POA: Diagnosis not present

## 2019-02-02 DIAGNOSIS — Z90710 Acquired absence of both cervix and uterus: Secondary | ICD-10-CM | POA: Diagnosis not present

## 2019-02-02 DIAGNOSIS — M069 Rheumatoid arthritis, unspecified: Secondary | ICD-10-CM | POA: Diagnosis not present

## 2019-02-02 DIAGNOSIS — E559 Vitamin D deficiency, unspecified: Secondary | ICD-10-CM | POA: Diagnosis not present

## 2019-02-02 DIAGNOSIS — R5381 Other malaise: Secondary | ICD-10-CM | POA: Diagnosis not present

## 2019-02-02 DIAGNOSIS — M797 Fibromyalgia: Secondary | ICD-10-CM | POA: Diagnosis not present

## 2019-02-02 DIAGNOSIS — Z792 Long term (current) use of antibiotics: Secondary | ICD-10-CM | POA: Diagnosis not present

## 2019-02-02 DIAGNOSIS — R627 Adult failure to thrive: Secondary | ICD-10-CM | POA: Diagnosis not present

## 2019-02-02 DIAGNOSIS — F329 Major depressive disorder, single episode, unspecified: Secondary | ICD-10-CM | POA: Diagnosis not present

## 2019-02-02 DIAGNOSIS — R634 Abnormal weight loss: Secondary | ICD-10-CM | POA: Diagnosis not present

## 2019-02-02 NOTE — Telephone Encounter (Signed)
Called patient and told her that she would need to call [facility] and have them fax over the appropriate forms and gave her our fax number [phone] and she verbalized understanding

## 2019-02-05 NOTE — Telephone Encounter (Signed)
Thanks for letting me know Bre, can you please call her in 2 weeks to see how she is doing. Thx  RG

## 2019-02-07 DIAGNOSIS — F419 Anxiety disorder, unspecified: Secondary | ICD-10-CM | POA: Diagnosis not present

## 2019-02-07 DIAGNOSIS — E440 Moderate protein-calorie malnutrition: Secondary | ICD-10-CM | POA: Diagnosis not present

## 2019-02-07 DIAGNOSIS — M069 Rheumatoid arthritis, unspecified: Secondary | ICD-10-CM | POA: Diagnosis not present

## 2019-02-07 DIAGNOSIS — E559 Vitamin D deficiency, unspecified: Secondary | ICD-10-CM | POA: Diagnosis not present

## 2019-02-07 DIAGNOSIS — N390 Urinary tract infection, site not specified: Secondary | ICD-10-CM | POA: Diagnosis not present

## 2019-02-07 DIAGNOSIS — M797 Fibromyalgia: Secondary | ICD-10-CM | POA: Diagnosis not present

## 2019-02-08 DIAGNOSIS — E440 Moderate protein-calorie malnutrition: Secondary | ICD-10-CM | POA: Diagnosis not present

## 2019-02-08 DIAGNOSIS — M069 Rheumatoid arthritis, unspecified: Secondary | ICD-10-CM | POA: Diagnosis not present

## 2019-02-08 DIAGNOSIS — M797 Fibromyalgia: Secondary | ICD-10-CM | POA: Diagnosis not present

## 2019-02-08 DIAGNOSIS — N390 Urinary tract infection, site not specified: Secondary | ICD-10-CM | POA: Diagnosis not present

## 2019-02-08 DIAGNOSIS — E559 Vitamin D deficiency, unspecified: Secondary | ICD-10-CM | POA: Diagnosis not present

## 2019-02-08 DIAGNOSIS — F419 Anxiety disorder, unspecified: Secondary | ICD-10-CM | POA: Diagnosis not present

## 2019-02-09 NOTE — Telephone Encounter (Signed)
Spoke with Donna Francis at [facility] health who said that she has spoke to patient already and let her know that they can not provide any nutritional supplements. They had given patient the option to go to [facility] because sometimes they get donations of them.

## 2019-02-09 NOTE — Telephone Encounter (Signed)
No problems  Please call in GI cocktail 15 cc po q6hrs prn #120 ml (equal amounts of Maalox, viscous lidocaine and liquid Donnatal/Bentyl), with 2 refills  Let us know how she does in 2 weeks  RG

## 2019-02-09 NOTE — Telephone Encounter (Signed)
Patient reports she is still having the same symptoms as the last time she called; please advise

## 2019-02-09 NOTE — Telephone Encounter (Signed)
Spoke with Elberta Fortis and he said that he will faxed over an appeal for the nutritional supplement. Case number [id number]. Fax will be coming to the [phone] from him

## 2019-02-12 DIAGNOSIS — F419 Anxiety disorder, unspecified: Secondary | ICD-10-CM | POA: Diagnosis not present

## 2019-02-12 DIAGNOSIS — E559 Vitamin D deficiency, unspecified: Secondary | ICD-10-CM | POA: Diagnosis not present

## 2019-02-12 DIAGNOSIS — E440 Moderate protein-calorie malnutrition: Secondary | ICD-10-CM | POA: Diagnosis not present

## 2019-02-12 DIAGNOSIS — M797 Fibromyalgia: Secondary | ICD-10-CM | POA: Diagnosis not present

## 2019-02-12 DIAGNOSIS — M069 Rheumatoid arthritis, unspecified: Secondary | ICD-10-CM | POA: Diagnosis not present

## 2019-02-12 DIAGNOSIS — N390 Urinary tract infection, site not specified: Secondary | ICD-10-CM | POA: Diagnosis not present

## 2019-02-12 NOTE — Telephone Encounter (Signed)
Called and spoke with patient-patient reports she still has some of this medication and also has a refill on it as well-the patient reports this medication does work but it is really expensive-she cannot afford to get it refilled- Patient reports she has not been using the Pepcid [facility] -does she need to go back on it to see if that will relieve her symptoms? Please advise

## 2019-02-12 NOTE — Telephone Encounter (Signed)
She can go back on Pepcid AC OTC 20 mg once a day and use GI cocktail only on as-needed basis That will decrease the cost.  RG

## 2019-02-13 NOTE — Telephone Encounter (Signed)
Called and spoke with patient-patient informed of MD recommendations; patient is agreeable with plan of care and and will call back to the office should questions/concerns arise; patient verbalized understanding of information/instrucitons;

## 2019-02-14 DIAGNOSIS — R0689 Other abnormalities of breathing: Secondary | ICD-10-CM | POA: Diagnosis not present

## 2019-02-14 DIAGNOSIS — R000 Tachycardia, unspecified: Secondary | ICD-10-CM | POA: Diagnosis not present

## 2019-02-14 DIAGNOSIS — R109 Unspecified abdominal pain: Secondary | ICD-10-CM | POA: Diagnosis not present

## 2019-02-14 DIAGNOSIS — R05 Cough: Secondary | ICD-10-CM | POA: Diagnosis not present

## 2019-02-14 DIAGNOSIS — R079 Chest pain, unspecified: Secondary | ICD-10-CM | POA: Diagnosis not present

## 2019-02-14 DIAGNOSIS — R072 Precordial pain: Secondary | ICD-10-CM | POA: Diagnosis not present

## 2019-02-16 ENCOUNTER — Telehealth: Payer: Self-pay | Admitting: Gastroenterology

## 2019-02-19 NOTE — Telephone Encounter (Signed)
See note below

## 2019-03-02 DIAGNOSIS — Z20828 Contact with and (suspected) exposure to other viral communicable diseases: Secondary | ICD-10-CM | POA: Diagnosis not present

## 2019-03-02 DIAGNOSIS — R634 Abnormal weight loss: Secondary | ICD-10-CM | POA: Diagnosis not present

## 2019-03-02 DIAGNOSIS — M797 Fibromyalgia: Secondary | ICD-10-CM | POA: Diagnosis not present

## 2019-03-02 DIAGNOSIS — R0789 Other chest pain: Secondary | ICD-10-CM | POA: Diagnosis not present

## 2019-03-02 DIAGNOSIS — F419 Anxiety disorder, unspecified: Secondary | ICD-10-CM | POA: Diagnosis not present

## 2019-03-08 ENCOUNTER — Telehealth: Payer: Self-pay | Admitting: Gastroenterology

## 2019-03-08 NOTE — Telephone Encounter (Signed)
Pt reported that she is experiencing severe abd p, nausea and a burning sensation in her stomach.  Please advise.

## 2019-03-08 NOTE — Telephone Encounter (Signed)
Called and spoke with patient- patient reports she has been taking the GI cocktail and it does not seem to be working and her abdominal pain is getting worse; burning sensation is not in my esophagus but it is in my stomach; patient reports she is not taking her Pepcid-is taking the probiotic, is NOT using the Mylanta, is using the Tums as a rapid relief in the AM for the abd pain- is worried about the acid reflux being so bad=burping a lot, no trouble swallowing;   Patient reports she is not consuming dairy, and is still having abdominal cramping every am; patient complains of having headaches; patient reports she is not even able to eat soup without having chest and abdominal pain -pain is consistent   Not eating at least 3 hrs before bed, not having acid reflux at night just having issues when she wakes up in the AM-not eating any fried foods and making sure not to eat any thing that would increase acid reflux symptoms   Please advise alternative measures

## 2019-03-09 NOTE — Telephone Encounter (Signed)
Patient returned call to the office- informed of MD recommendations and patient is requesting to hold off on the GES until she speaks with Dr. Lyndel Safe at her next and upcoming appt; Patient advised to call back to the office at [phone] should questions/concerns arise; Patient verbalized understanding of information/instructions;

## 2019-03-09 NOTE — Telephone Encounter (Signed)
No problems  Had negative extensive work-up.  Tell her-maybe she is not emptying out her food enough.  Plan: -Lets get a solid-phase gastric emptying scan to r/o gastroparesis -For now continue the same treatment  RG

## 2019-03-11 ENCOUNTER — Telehealth: Payer: Self-pay | Admitting: Internal Medicine

## 2019-03-11 NOTE — Telephone Encounter (Signed)
Pt called me yesterday around 18:00 as on-call MD for our practice.  Reporting heartburn/pyrosis and epigastric burning. GI cocktail Rx by Dr. Lyndel Safe helps but not for long. She was very anxious and we discussed heartburn treatments, many of which she has tried and reports they "make my symptoms much worse" Ranitidine in the past was helpful, but obviously unavailable now.  Thus I recommended famotidine, but she has tried this and feels it worsens symptoms. We discussed PPI and she reports they do not help her and worsen her symptoms. She is following a GERD diet. She is concerned and feels she may need "the light and the camera".  A/P GERD and dyspepsia symptoms -Multiple previously tried meds. -Can continue GI cocktail.  I suggested she try OTC Gaviscon per bottle instruction. -Further recs per Dr. Lyndel Safe.  Time provided for questions and answers.  She thanked me for the call. Time on call = 19 min.

## 2019-03-12 DIAGNOSIS — R531 Weakness: Secondary | ICD-10-CM | POA: Diagnosis not present

## 2019-03-12 DIAGNOSIS — E46 Unspecified protein-calorie malnutrition: Secondary | ICD-10-CM | POA: Diagnosis not present

## 2019-03-12 DIAGNOSIS — R1084 Generalized abdominal pain: Secondary | ICD-10-CM | POA: Diagnosis not present

## 2019-03-12 DIAGNOSIS — R102 Pelvic and perineal pain: Secondary | ICD-10-CM | POA: Diagnosis not present

## 2019-03-12 NOTE — Telephone Encounter (Signed)
Please advise if further action is needed by RN

## 2019-03-12 NOTE — Telephone Encounter (Signed)
Lets try Gaviscon as per previous note. Proceed with solid-phase gastric emptying scan Follow-up thereafter Thx  RG

## 2019-03-13 NOTE — Telephone Encounter (Signed)
Talk to her over the phone in detail She has appointment coming up on 03/15/2019 Hold off on Gaviscon for now  She was in [facility] recently-please get the recent discharge summary before her clinic appointment.   Thx  RG

## 2019-03-13 NOTE — Telephone Encounter (Signed)
Patient reports she wants to speak with Dr. Lyndel Safe before she schedules a gastric emptying study- Gaviscon is not found in previous notation-please specify dosage, route, quantity, refills?

## 2019-03-15 ENCOUNTER — Other Ambulatory Visit: Payer: Self-pay

## 2019-03-15 ENCOUNTER — Ambulatory Visit (INDEPENDENT_AMBULATORY_CARE_PROVIDER_SITE_OTHER): Payer: Medicare Other | Admitting: Gastroenterology

## 2019-03-15 ENCOUNTER — Ambulatory Visit: Payer: Medicare Other | Admitting: Gastroenterology

## 2019-03-15 ENCOUNTER — Encounter: Payer: Self-pay | Admitting: Gastroenterology

## 2019-03-15 VITALS — BP 114/78 | HR 145 | Temp 98.3°F | Ht <= 58 in | Wt 75.0 lb

## 2019-03-15 DIAGNOSIS — K582 Mixed irritable bowel syndrome: Secondary | ICD-10-CM | POA: Diagnosis not present

## 2019-03-15 DIAGNOSIS — R1013 Epigastric pain: Secondary | ICD-10-CM | POA: Diagnosis not present

## 2019-03-15 DIAGNOSIS — R634 Abnormal weight loss: Secondary | ICD-10-CM | POA: Diagnosis not present

## 2019-03-15 DIAGNOSIS — R1031 Right lower quadrant pain: Secondary | ICD-10-CM | POA: Diagnosis not present

## 2019-03-15 DIAGNOSIS — G8929 Other chronic pain: Secondary | ICD-10-CM | POA: Diagnosis not present

## 2019-03-15 DIAGNOSIS — K219 Gastro-esophageal reflux disease without esophagitis: Secondary | ICD-10-CM | POA: Diagnosis not present

## 2019-03-15 DIAGNOSIS — R1032 Left lower quadrant pain: Secondary | ICD-10-CM

## 2019-03-15 MED ORDER — AMBULATORY NON FORMULARY MEDICATION: 0 refills | Status: DC

## 2019-03-15 MED ORDER — HYOSCYAMINE SULFATE 0.125 MG SL SUBL: SUBLINGUAL_TABLET | SUBLINGUAL | 0 refills | Status: DC

## 2019-03-15 NOTE — Telephone Encounter (Signed)
I have requested records on this patient.

## 2019-03-15 NOTE — Telephone Encounter (Signed)
Trisha-please obtain medical records for Dr. Lyndel Safe

## 2019-03-15 NOTE — Patient Instructions (Signed)
If you are age 56 or older, your body mass index should be between 23-30. Your Body mass index is 16.52 kg/m. If this is out of the aforementioned range listed, please consider follow up with your Primary Care Provider.  If you are age 2 or younger, your body mass index should be between 19-25. Your Body mass index is 16.52 kg/m. If this is out of the aformentioned range listed, please consider follow up with your Primary Care Provider.   We have sent the following medications to your pharmacy for you to pick up at your convenience: Hyoscamine  GI Cocktail  Please purchase the following medications over the counter and take as directed: Tums twice daily.   You have been scheduled for a gastric emptying scan at [facility] on 04/06/19 at 7:30am. Please arrive at least 15 minutes prior to your appointment for registration. Please make certain not to have anything to eat or drink after midnight the night before your test. Hold all stomach medications (ex: Zofran, phenergan, Reglan) 48 hours prior to your test. If you need to reschedule your appointment, please contact radiology scheduling at [phone]. _____________________________________________________________________ A gastric-emptying study measures how long it takes for food to move through your stomach. There are several ways to measure stomach emptying. In the most common test, you eat food that contains a small amount of radioactive material. A scanner that detects the movement of the radioactive material is placed over your abdomen to monitor the rate at which food leaves your stomach. This test normally takes about 4 hours to complete. _____________________________________________________________________   Follow up in 12 weeks.   Thank you,  Dr. Jackquline Denmark

## 2019-03-15 NOTE — Progress Notes (Signed)
Chief Complaint: FU  Referring Provider:  Dr Delena Bali      ASSESSMENT AND PLAN;   #1. Chronic lower abdominal pain since hysterectomy 04/2016. Dx with IC by urology and fibromyalgia.  Multiple CT scans- neg 12/2016, 07/2017, 12/2018 and most recently 02/14/2019  #2. Weight loss with neg multiple CTs 07/27/2017, 12/2018, 02/14/2019. Nl CBC, CMP, TSH. Neg Virtual colonoscopy 11/22/2017. I believe anxiety playing a significant role.  #3. IBS with alt diarrhea and constipation. Neg stool studies, cologuard test, failed bentyl.  #4. GERD with neg Ba Swallow 04/2018. Failed carafate, GI coctail, pepcid, protonix, omeprazole, nexium (caused burning) ("can't do PPIs")  #5. Epigastic  Pain/LUQ pain d/t fibromyalgia- neg CT AP, [place] 12/2018, 01/2019, [place] 2019. Barium swallow/upper GI with small bowel follow-through on 05/15/2018-[facility]-mild to moderate gastroesophageal reflux without any stricture.  Normal stomach and small bowel.  Plan: - Solid phase GES. - Refuses EGD. She does understand that there is a small but definite risk of missing GI neoplasms. - High cal foods.  More frequent meals. - GI cocktail 15 cc p.o. 3 times daily as needed, 120 mL, 2 refills. - TUMS 1 bid/TID as needed - Hyoscamine 0.125mg  S/L Q6-8 hra prn #60 - If still with problems, trial of remeron 15 mg po qd (1/2 po qhs). Refuses at this time. - Can we arrange for Boost or any nutrition supplement 1/day? ([place] [phone]) - Record weight q weekly.   - FU in 12 weeks.  Earlier, if she still has problems.   HPI:    Donna Francis is a 58 y.o. female  For follow-up visit Had multiple visits to ED at [facility]  Most recent on 02/14/2019 with generalized abdominal pain. CT chest/Abdo/pelvis with p.o. and IV contrast which was unremarkable.  She had normal CBC, CMP, PT.  Continues to have symptoms of "reflux" with associated nausea but no vomiting.  She also has associated  epigastric/left upper quadrant abdominal pain.  She "cannot do PPIs" as they make her reflux worse.  Has been using GI cocktail on as-needed basis and Tums for breakthrough.  Refuses EGD.  Had neg UGI with small bowel follow-through as above except for reflux.  No odynophagia or dysphagia.  Has been eating small but frequent meals.  Has been having "mushy stools".  No melena or hematochezia.  Refuses colonoscopy as well.  Had virtual colonoscopy as above.  Adm 12/25/2018 to [facility] with epigastric pain.  Negative ultrasound, CT Abdo/pelvis, normal labs including CBC, CMP, lipase.  Given GI cocktail.  Was found to have UTI-treated with Rocephin and then Augmentin.  Adm to [facility] with generalized weakness 01/01/2019-negative CT head, MRI of the head. Dx again with pseudoseizures. Nl CBC, CMP, urine drug screen.  Now at home with home health care.  Has lost 3 pounds since December 2019, none since 12/2018. Wants [place] to give nutritional supplements as a prescription since she cannot afford it.  "all started after hystrectomy 04/26/2016"  Denies having any further dysphagia.  She had negative upper GI series except for reflux.  Could not tolerate any medications including Pepcid, Carafate, GI cocktail, most of PPIs.  Currently she is on PRN Mylanta and as needed Tums.  Under considerable stress since Daughter had near fatal accident 02/2018 [address].  Wt Readings from Last 3 Encounters:  03/15/19 75 lb (34 kg)  01/19/19 75 lb (34 kg)  05/08/18 78 lb 8 oz (35.6 kg)  Past Medical History:  Diagnosis Date  . Anxiety   . Colitis   . IBS (irritable bowel syndrome)   . Varicose veins of bilateral lower extremities with pain          Past Surgical History:  Procedure Laterality Date  . ABDOMINAL HYSTERECTOMY  04/2016  . CESAREAN SECTION     x 4  . COLONOSCOPY  1995   in [place]         Family History  Problem Relation Age of Onset  . Pancreatic cancer Mother      Social History        Tobacco Use  . Smoking status: Never Smoker  . Smokeless tobacco: Never Used  Substance Use Topics  . Alcohol use: Yes    Comment: minimal  . Drug use: No          Current Outpatient Medications  Medication Sig Dispense Refill  . ALPRAZolam (XANAX) 0.5 MG tablet Take 0.5 mg by mouth 3 (three) times daily.     . Probiotic Product (PROBIOTIC DAILY PO) Take by mouth daily.     No current facility-administered medications for this visit.     No Known Allergies  Review of Systems:  Psychiatric/Behavioral:  Has anxiety or depression     Physical Exam:    Vitals:   03/15/19 1330  Weight: 75 lb (34 kg)  Height: 4' 8.5" (1.435 m)   Filed Weights   03/15/19 1330  Weight: 75 lb (34 kg)  Thin cachectic female.   HEENT: No jaundice no pallor Respiratory: Bilateral clear, CVS: S1-S2 normal no S3 or S4, patient has tachycardia Abdomen: Soft, voluntary guarding, mild upper abdominal tenderness.  Bowel sounds are present.  There is no definite hepatosplenomegaly. Extremities: No edema.  Labs from 02/14/2019: Hemoglobin 12.6, MCV 89, platelets 268, WBC count 5.6.  Normal CMP with BUN 17 creatinine 0.8, normal LFTs AST 32 ALT 15, albumin 4.5  CT chest Abdo/pelvis with contrast: 02/14/2019-negative for any acute abnormalities.  Report sent for scanning.  25 minutes spent with the patient today. Greater than 50% was spent in counseling and coordination of care with the patient    Carmell Austria, MD  Cc: Dr Delena Bali

## 2019-03-19 ENCOUNTER — Telehealth: Payer: Self-pay | Admitting: Gastroenterology

## 2019-03-19 NOTE — Telephone Encounter (Signed)
Pt is calling saying that she does not want to go through with the procedure on 11/13 for gastric emptying at [facility]. She said that she would like to speak to someone about maybe doing an endoscopy.

## 2019-03-19 NOTE — Telephone Encounter (Signed)
Please advise after reviewing previous message from the patient-next step in plan of care

## 2019-03-20 DIAGNOSIS — F411 Generalized anxiety disorder: Secondary | ICD-10-CM | POA: Diagnosis not present

## 2019-03-20 NOTE — Telephone Encounter (Signed)
No problems Lets get EGD done Still if EGD is negative, she would need gastric emptying scan Please let her know  RG

## 2019-03-21 NOTE — Telephone Encounter (Signed)
Called and spoke with patient- patient was informed of MD recommendations and is requesting to have an appt to be scheduled to talk with Dr. Lyndel Safe and then be scheduled for an EGD; appt has been made for 03/30/2019 at 1:30 pm; patient is adamant she wants to talk with Dr. Lyndel Safe before going through with any testing/procedures; patient has requested that an [place] be scheduled before she is scheduled to have an EGD-but wants to ask Dr. Lyndel Safe a "few more questions before he makes me go through with the EGD";

## 2019-03-24 ENCOUNTER — Telehealth: Payer: Self-pay | Admitting: Gastroenterology

## 2019-03-24 NOTE — Telephone Encounter (Signed)
Patient taking GI cocktail and taking TUMS. Getting SOB and pains in back that are radiating throughout abdomen. The patient is having more issues. As noted on prior telephone notes she has felt concerned about her symptoms. She has gone back and forth to EDs in the past. Not tolerated PPIs. I think she needs to be evaluated at the local ED and she is going to call 911. I discussed what an EGD would be like but she is hesitant and only wants to discuss this further with Dr. Lyndel Safe. She is also concerned about the SF-GES but I told her about it and that it was not something that has huge side-effects unless she had a reaction to the radiotracer. She was appreciative for call back.   Justice Britain, MD [facility] # [phone]

## 2019-03-25 DIAGNOSIS — R52 Pain, unspecified: Secondary | ICD-10-CM | POA: Diagnosis not present

## 2019-03-25 DIAGNOSIS — R1032 Left lower quadrant pain: Secondary | ICD-10-CM | POA: Diagnosis not present

## 2019-03-25 DIAGNOSIS — R1012 Left upper quadrant pain: Secondary | ICD-10-CM | POA: Diagnosis not present

## 2019-03-25 DIAGNOSIS — K2900 Acute gastritis without bleeding: Secondary | ICD-10-CM | POA: Diagnosis not present

## 2019-03-25 DIAGNOSIS — R1084 Generalized abdominal pain: Secondary | ICD-10-CM | POA: Diagnosis not present

## 2019-03-25 NOTE — Telephone Encounter (Signed)
Thanks a Personnel officer. I know you were very busy.   Bre, Pl proceed with solid phase GES. Then FU  RG

## 2019-03-27 NOTE — Telephone Encounter (Signed)
Called and spoke with patient-patient informed of MD recommendations; patient is not agreeable with plan of care and wishes to talk to Dr. Lyndel Safe on her scheduled appt on 03/30/2019 at 1:30 pm; Patient verbalized understanding of information/instructions;  Patient was advised to call the office at [phone] if questions/concerns arise;  Patient reports she has called [facility] to make sure her medical records from the latest ER visit is sent to him for review;

## 2019-03-28 DIAGNOSIS — R627 Adult failure to thrive: Secondary | ICD-10-CM | POA: Diagnosis not present

## 2019-03-28 DIAGNOSIS — R079 Chest pain, unspecified: Secondary | ICD-10-CM | POA: Diagnosis not present

## 2019-03-28 DIAGNOSIS — R0789 Other chest pain: Secondary | ICD-10-CM | POA: Diagnosis not present

## 2019-03-28 DIAGNOSIS — R000 Tachycardia, unspecified: Secondary | ICD-10-CM | POA: Diagnosis not present

## 2019-03-28 DIAGNOSIS — K209 Esophagitis, unspecified without bleeding: Secondary | ICD-10-CM | POA: Diagnosis not present

## 2019-03-28 DIAGNOSIS — R0602 Shortness of breath: Secondary | ICD-10-CM | POA: Diagnosis not present

## 2019-03-28 DIAGNOSIS — M542 Cervicalgia: Secondary | ICD-10-CM | POA: Diagnosis not present

## 2019-03-28 DIAGNOSIS — E860 Dehydration: Secondary | ICD-10-CM | POA: Diagnosis not present

## 2019-03-28 DIAGNOSIS — R569 Unspecified convulsions: Secondary | ICD-10-CM | POA: Diagnosis not present

## 2019-03-28 DIAGNOSIS — K449 Diaphragmatic hernia without obstruction or gangrene: Secondary | ICD-10-CM | POA: Diagnosis not present

## 2019-03-30 ENCOUNTER — Emergency Department (HOSPITAL_BASED_OUTPATIENT_CLINIC_OR_DEPARTMENT_OTHER)
Admission: EM | Admit: 2019-03-30 | Discharge: 2019-03-30 | Disposition: A | Discharge status: Home / Self Care | Payer: Medicare Other | Attending: Emergency Medicine | Admitting: Emergency Medicine

## 2019-03-30 ENCOUNTER — Encounter (HOSPITAL_BASED_OUTPATIENT_CLINIC_OR_DEPARTMENT_OTHER): Payer: Self-pay

## 2019-03-30 ENCOUNTER — Other Ambulatory Visit: Payer: Self-pay

## 2019-03-30 ENCOUNTER — Telehealth: Payer: Self-pay | Admitting: Gastroenterology

## 2019-03-30 ENCOUNTER — Encounter: Payer: Self-pay | Admitting: Gastroenterology

## 2019-03-30 ENCOUNTER — Ambulatory Visit (INDEPENDENT_AMBULATORY_CARE_PROVIDER_SITE_OTHER): Payer: Medicare Other | Admitting: Gastroenterology

## 2019-03-30 VITALS — BP 100/60 | HR 159 | Ht <= 58 in | Wt 73.0 lb

## 2019-03-30 DIAGNOSIS — G8929 Other chronic pain: Secondary | ICD-10-CM

## 2019-03-30 DIAGNOSIS — R1084 Generalized abdominal pain: Secondary | ICD-10-CM | POA: Diagnosis not present

## 2019-03-30 DIAGNOSIS — M797 Fibromyalgia: Secondary | ICD-10-CM | POA: Diagnosis not present

## 2019-03-30 DIAGNOSIS — Z79899 Other long term (current) drug therapy: Secondary | ICD-10-CM | POA: Insufficient documentation

## 2019-03-30 DIAGNOSIS — R627 Adult failure to thrive: Secondary | ICD-10-CM | POA: Diagnosis not present

## 2019-03-30 DIAGNOSIS — R000 Tachycardia, unspecified: Secondary | ICD-10-CM | POA: Diagnosis not present

## 2019-03-30 DIAGNOSIS — R109 Unspecified abdominal pain: Secondary | ICD-10-CM

## 2019-03-30 LAB — CBC WITH DIFFERENTIAL/PLATELET
Abs Immature Granulocytes: 0 10*3/uL (ref 0.00–0.07)
Basophils Absolute: 0 10*3/uL (ref 0.0–0.1)
Basophils Relative: 1 %
Eosinophils Absolute: 0 10*3/uL (ref 0.0–0.5)
Eosinophils Relative: 0 %
HCT: 38.5 % (ref 36.0–46.0)
Hemoglobin: 12.6 g/dL (ref 12.0–15.0)
Immature Granulocytes: 0 %
Lymphocytes Relative: 22 %
Lymphs Abs: 0.8 10*3/uL (ref 0.7–4.0)
MCH: 29.5 pg (ref 26.0–34.0)
MCHC: 32.7 g/dL (ref 30.0–36.0)
MCV: 90.2 fL (ref 80.0–100.0)
Monocytes Absolute: 0.5 10*3/uL (ref 0.1–1.0)
Monocytes Relative: 12 %
Neutro Abs: 2.6 10*3/uL (ref 1.7–7.7)
Neutrophils Relative %: 65 %
Platelets: 268 10*3/uL (ref 150–400)
RBC: 4.27 MIL/uL (ref 3.87–5.11)
RDW: 11.9 % (ref 11.5–15.5)
WBC: 3.9 10*3/uL — ABNORMAL LOW (ref 4.0–10.5)
nRBC: 0 % (ref 0.0–0.2)

## 2019-03-30 LAB — COMPREHENSIVE METABOLIC PANEL
ALT: 16 U/L (ref 0–44)
AST: 23 U/L (ref 15–41)
Albumin: 4.3 g/dL (ref 3.5–5.0)
Alkaline Phosphatase: 52 U/L (ref 38–126)
Anion gap: 12 (ref 5–15)
BUN: 5 mg/dL — ABNORMAL LOW (ref 6–20)
CO2: 24 mmol/L (ref 22–32)
Calcium: 9.9 mg/dL (ref 8.9–10.3)
Chloride: 100 mmol/L (ref 98–111)
Creatinine, Ser: 0.62 mg/dL (ref 0.44–1.00)
GFR calc Af Amer: >60 mL/min (ref >60)
GFR calc non Af Amer: >60 mL/min (ref >60)
Glucose, Bld: 96 mg/dL (ref 70–99)
Potassium: 3.7 mmol/L (ref 3.5–5.1)
Sodium: 136 mmol/L (ref 135–145)
Total Bilirubin: 0.4 mg/dL (ref 0.3–1.2)
Total Protein: 7.9 g/dL (ref 6.5–8.1)

## 2019-03-30 LAB — URINALYSIS, ROUTINE W REFLEX MICROSCOPIC
Bilirubin Urine: NEGATIVE
Glucose, UA: NEGATIVE mg/dL
Hgb urine dipstick: NEGATIVE
Ketones, ur: 15 mg/dL — AB
Nitrite: NEGATIVE
Protein, ur: NEGATIVE mg/dL
Specific Gravity, Urine: 1.01 (ref 1.005–1.030)
pH: 7 (ref 5.0–8.0)

## 2019-03-30 LAB — URINALYSIS, MICROSCOPIC (REFLEX): RBC / HPF: NONE SEEN RBC/hpf (ref 0–5)

## 2019-03-30 LAB — LIPASE, BLOOD: Lipase: 27 U/L (ref 11–51)

## 2019-03-30 MED ORDER — SODIUM CHLORIDE 0.9 % IV BOLUS
1000.0000 mL | Freq: Once | INTRAVENOUS | Status: AC
  Administered 2019-03-30: 1000 mL via INTRAVENOUS

## 2019-03-30 MED ORDER — ALUM & MAG HYDROXIDE-SIMETH 200-200-20 MG/5ML PO SUSP
30.0000 mL | Freq: Once | ORAL | Status: AC
  Administered 2019-03-30: 30 mL via ORAL
  Filled 2019-03-30: qty 30

## 2019-03-30 MED ORDER — LIDOCAINE VISCOUS HCL 2 % MT SOLN
15.0000 mL | Freq: Once | OROMUCOSAL | Status: AC
  Administered 2019-03-30: 15 mL via ORAL
  Filled 2019-03-30: qty 15

## 2019-03-30 MED ORDER — HALOPERIDOL LACTATE 5 MG/ML IJ SOLN: 2.0000 mg | Freq: Once | INTRAMUSCULAR | Status: DC

## 2019-03-30 NOTE — ED Triage Notes (Addendum)
Pt c/o abd pain since July and weight loss with multiple MD visits and imporovement-pt sent from GI office in the building stating her heart rate was in the 150s in the office-NAD-to triage in w/c

## 2019-03-30 NOTE — Telephone Encounter (Signed)
Called and spoke with patient -patient reports she had a dose of her GI cocktail to see if that would help with symptoms; having severe abd pain-wanting to be seen sooner than 1:30 pm today -appts not available with Dr. Lyndel Safe until then-patient advised if the pain increases she will need to go to the ER([facility] is within the [facility] system)- patient reports she will go to the ER if the GI cocktail does not start working and will notify the office if she goes-  Patient verbalized understanding of information/instructions;   Patient advised to call back to the office at [phone] should questions/concerns arise;  Patient has been scheduled for her GES on 04/06/2019 and has declined to have an EGD prior to her calling in today with update on symptoms;  Please advise if further action is needed at this time;

## 2019-03-30 NOTE — ED Provider Notes (Addendum)
[facility] EMERGENCY DEPARTMENT Provider Note   CSN: [id number] Arrival date & time: 03/30/19  1430     History   Chief Complaint Chief Complaint  Patient presents with  . Abdominal Pain    HPI Donna Francis is a 58 y.o. female with a hx of IBS, interstitial cystitis, RA, anxiety, & prior abdominal surgeries including hysterectomy, C-section, & tubal ligation who presents to the ED from GI office with complaints of chronic abdominal pain x 5 months. Patient states she has had problems with abdominal pain since hysterectomy in 2017, this has seemed to be worse over the past 5 months, she states she has seen specialist including urology & gastroenterology and has had multiple ED visits with CT imaging- most recently 03/28/19 @ [facility] ER during which she has a CT A/P which she relays showed "an inflamed esophagus and nothing else" - unable to view report in [place]. She states pain is burning/cramping discomfort to the generalized abdomen worse in the epigastric area. This is alleviated somewhat with GI cocktails which she takes 3 times per day- last time she took this was this AM. She has tried PPIs/H2 blockers in the past which she states has made it worse. She went to gastroenterologist Dr. Lyndel Safe today in clinic and was very tearful & tachycardic & sent to the ED. Has had nausea & loose stools which are not new. Denies fever, chills, emesis, melena, hematochezia, or chest pain. No significant change in sxs since last ED visit.     HPI  Past Medical History:  Diagnosis Date  . Anxiety   . Colitis   . Fibromyalgia   . IBS (irritable bowel syndrome)   . Interstitial cystitis   . Panic attacks   . Rapid heartbeat   . Rheumatoid arthritis ([place])   . Seizures ([place])   . UTI (urinary tract infection)   . Varicose veins of bilateral lower extremities with pain     Patient Active Problem List   Diagnosis Date Noted  . Seizure-like activity ([place]) 10/18/2017    Past Surgical  History:  Procedure Laterality Date  . CESAREAN SECTION     x 4  . COLONOSCOPY  1995   in [place]  . LAPAROSCOPIC HYSTERECTOMY  04/26/2016  . TUBAL LIGATION       OB History   No obstetric history on file.      Home Medications    Prior to Admission medications   Medication Sig Start Date End Date Taking? Authorizing Provider  ALPRAZolam Duanne Moron) 1 MG tablet Take 1 mg by mouth 3 (three) times daily.    [provider]  Alum & Mag Hydroxide-Simeth (MYLANTA PO) Take 1 tablet by mouth as needed.    [provider]  AMBULATORY NON FORMULARY MEDICATION Medication Name: GI Cocktail  ( equal parts of Viscous Lidocaine,  Bentyl, Maalox)  10 cc three times daily as needed. 03/15/19   Jackquline Denmark, MD  hyoscyamine (LEVSIN SL) 0.125 MG SL tablet Take one under the tongue every 6-8 hours as needed. Patient not taking: Reported on 03/30/2019 03/15/19   Jackquline Denmark, MD  LORazepam (ATIVAN) 1 MG tablet Take 1 mg by mouth as needed (as needed for when seizure starts to occur).     [provider]  Probiotic Product ([place]) Take by mouth.    [provider]    Family History Family History  Problem Relation Age of Onset  . Pancreatic cancer Mother   .  Diabetes Mother     Social History Social History   Tobacco Use  . Smoking status: Never Smoker  . Smokeless tobacco: Never Used  Substance Use Topics  . Alcohol use: Yes    Comment: minimal  . Drug use: No     Allergies   Patient has no known allergies.   Review of Systems Review of Systems  Constitutional: Negative for chills and fever.       Positive for continued weight loss.   HENT: Negative for congestion, ear pain and sore throat.   Respiratory: Negative for shortness of breath.   Cardiovascular: Negative for chest pain.  Gastrointestinal: Positive for abdominal pain, diarrhea and nausea. Negative for blood in stool, constipation and vomiting.  Genitourinary:  Negative for dysuria and vaginal bleeding.  All other systems reviewed and are negative.   Physical Exam Updated Vital Signs BP (!) 146/100 (BP Location: Right Arm)   Pulse (!) 138   Temp 98.9 F (37.2 C) (Oral)   Resp 18   SpO2 98%   Physical Exam Vitals signs and nursing note reviewed.  Constitutional:      General: She is not in acute distress.    Appearance: She is not toxic-appearing.     Comments: Thin appearing.   HENT:     Head: Normocephalic and atraumatic.  Eyes:     General:        Right eye: No discharge.        Left eye: No discharge.     Conjunctiva/sclera: Conjunctivae normal.  Neck:     Musculoskeletal: Neck supple.  Cardiovascular:     Rate and Rhythm: Regular rhythm. Tachycardia present.  Pulmonary:     Effort: Pulmonary effort is normal. No respiratory distress.     Breath sounds: Normal breath sounds. No wheezing, rhonchi or rales.  Abdominal:     General: There is no distension.     Palpations: Abdomen is soft. There is no mass.     Tenderness: There is generalized abdominal tenderness (most prominent to the epigastrium). There is no guarding or rebound. Negative signs include Murphy's sign and McBurney's sign.  Skin:    General: Skin is warm and dry.     Findings: No rash.  Neurological:     Mental Status: She is alert.     Comments: Clear speech.   Psychiatric:        Mood and Affect: Mood is anxious.    ED Treatments / Results  Labs (all labs ordered are listed, but only abnormal results are displayed) Labs Reviewed  COMPREHENSIVE METABOLIC PANEL - Abnormal; Notable for the following components:      Result Value   BUN 5 (*)    All other components within normal limits  CBC WITH DIFFERENTIAL/PLATELET - Abnormal; Notable for the following components:   WBC 3.9 (*)    All other components within normal limits  URINALYSIS, ROUTINE W REFLEX MICROSCOPIC - Abnormal; Notable for the following components:   Ketones, ur 15 (*)     Leukocytes,Ua MODERATE (*)    All other components within normal limits  URINALYSIS, MICROSCOPIC (REFLEX) - Abnormal; Notable for the following components:   Bacteria, UA FEW (*)    All other components within normal limits  URINE CULTURE  LIPASE, BLOOD    EKG None  Radiology No results found.  Procedures Procedures (including critical care time)  Medications Ordered in ED Medications - No data to display   Initial Impression / Assessment and Plan /  ED Course  I have reviewed the triage vital signs and the nursing notes.  Pertinent labs & imaging results that were available during my care of the patient were reviewed by me and considered in my medical decision making (see chart for details).   Patient with a history of chronic abdominal pain presents to the emergency department from her GI physician's office with complaints of abdominal discomfort.  ER physician received a phone call from Dr. Steve Rattler office that patient was tearful and very tachycardic in clinic prompting emergency department visit, plan is for likely upper endoscopy for further assessment of her chronic pain.  On ER arrival patient is notably tachycardic in the 130s initially, BP somewhat elevated, doubt HTN emergency.  She seems quite anxious.  She has generalized abdominal tenderness that seems most prominent in the epigastric area without peritoneal signs.  Negative Murphys/Mcburneys. Patient's chart has been reviewed as well as her ER paperwork from recent visit, she has had several CT scans most recently 2 days prior, she has had no significant change in her symptoms, her abdominal exam is without peritoneal signs, do not feel that repeat imaging is necessary at this time.  We will proceed with abdominal labs, fluids, GI cocktail, and Haldol.--> patient ultimately refused haldol.   CBC: Mild leukopenia at 3.9.  No anemia.  No thrombocytopenia. CMP: Mildly low BUN, creatinine WNL.  No significant electrolyte  derangement.  LFTs WNL Lipase: WNL Urinalysis: Moderate leukocytes, no urinary symptoms, sent for culture.  Ketonuria present, receiving fluids in the ER.  Patient feeling much better following GI cocktail in the emergency department.  She is able to tolerate p.o.  She states she feels ready to go home.  On repeat abdominal exam she remains without peritoneal signs, do not suspect pancreatitis, cholecystitis, appendicitis, perforation, obstruction, or other acute surgical abdomen emergency.  Her tachycardia has substantially improved, she also has a history of this on chart review.  Patient appears appropriate for discharge home with close follow-up with her GI doctor.  We discussed options of additional medicines but she would prefer to take which she already has.  I discussed results, treatment plan, need for follow-up, and return precautions with the patient. Provided opportunity for questions, patient confirmed understanding and is in agreement with plan.   Findings and plan of care discussed with supervising physician Dr. Rex Kras who is in agreement.    Blood pressure (!) 135/98, pulse (!) 102, temperature 98.9 F (37.2 C), temperature source Oral, resp. rate 18, SpO2 99 %.  Final Clinical Impressions(s) / ED Diagnoses   Final diagnoses:  Abdominal pain, unspecified abdominal location    ED Discharge Orders    None       Amaryllis Dyke, PA-C 03/30/19 1707    Amaryllis Dyke, PA-C 03/30/19 1707    Little, Wenda Overland, MD 04/01/19 1839

## 2019-03-30 NOTE — Patient Instructions (Signed)
If you are age 56 or older, your body mass index should be between 23-30. Your Body mass index is 16.37 kg/m. If this is out of the aforementioned range listed, please consider follow up with your Primary Care Provider.  If you are age 56 or younger, your body mass index should be between 19-25. Your Body mass index is 16.37 kg/m. If this is out of the aformentioned range listed, please consider follow up with your Primary Care Provider.   Please go downstairs to the Emergency Department for evaluation.   Thank you,  Dr. Jackquline Denmark

## 2019-03-30 NOTE — Discharge Instructions (Addendum)
You were seen in the emergency department for abdominal pain.  Your labs were overall reassuring.  Please follow-up with Dr. Lyndel Safe on Monday to discuss your upper endoscopy and further management.  Return to the ER for new or worsening symptoms including but not limited to fever, worsening pain, blood in your stool, inability to keep fluids down, or any other concerns.

## 2019-03-30 NOTE — Progress Notes (Signed)
Chief Complaint: FU  Referring Provider:  Dr Delena Bali      ASSESSMENT AND PLAN;   #1.   Acute on chronic abdominal pain (since hysterectomy 04/2016). Dx with IC by urology, fibromyalgia/anxiety/depression/panic attacks/conversion disorder/pseudoseizures.  Multiple ED visits.  Multiple CT scans- neg 03/28/2019, 03/25/2019, 02/14/2019, 12/2016, 07/2017, 12/2018.   #2. Weight loss with neg multiple CTs 07/27/2017, 12/2018, 02/14/2019, 03/28/2019, 03/25/2019,. Nl CBC, CMP, TSH. Neg Virtual colonoscopy 11/22/2017. I believe anxiety playing a significant role.  #3. IBS with alt diarrhea and constipation. Neg stool studies, cologuard test, virtual colon. Failed bentyl.  #4. GERD with neg Ba Swallow 04/2018. Failed carafate, GI coctail, pepcid, protonix, omeprazole, nexium (caused burning) ("can't do PPIs")  #5. Epigastric Pain/LUQ pain d/t fibromyalgia- neg CT APs as above, [place] 12/2018, 01/2019, [place] 2019. Barium swallow/UGI with SB series on 05/15/2018-[facility]-mild to moderate gastroesophageal reflux without any stricture.  Nl stomach and small bowel.  #6. SIGNIFICANT ANXIETY/DEPRESSION/PANIC DISORDER/CONVERSION DISORDER    Plan: -Patient was crying throughout the clinic visit today and complaining of severe abdo pain.  I have sent her to ED here at [facility] for acute on chronic pain.  I have discussed with Dr. Rex Kras who has graciously accepted to see the patient. -We have obtained CT Abdo/pelvis performed on 03/25/2019 from [facility].  It showed mild GE junction thickening, moderate colonic stool.  After the contrast-she started having more diarrhea and is no longer constipated.     Addendum:  ED evaluation was negative.  Now she is willing to get EGD performed.  I have set it up for Monday, April 02, 2019.   Continue GI cocktail for now.  If EGD is neg, would recommend solid-phase GES.  Would also check spot urine for porphobilinogen (to r/o porphyria).  She has refused  trial of Remeron 15 mg daily.  I think it will be worthwhile.  If the entire above GI work-up is neg, would need psych consultation, followed by pain management consult.   HPI:    Donna Francis is a 58 y.o. female  For follow-up visit, accompanied by her daughter  She was crying throughout the clinic visit.  She also took Xanax which does help a little.   Had multiple visits to ED at [facility] -last visit 03/28/2019.  Before that she was seen on 03/25/2019.  Had CT Abdo/pelvis on both occasions.  CT from 03/25/2019 shows mild thickened GE junction (could represent esophagitis/gastritis), moderate colonic stool burden.  She had normal CBC with hemoglobin 12.5, MCV 90.  Negative UA.  Normal CMP.  At discharge she was given Bentyl, Carafate, Protonix.  She was also advised EGD.  We do not have the CT from 03/28/2019.  She was also seen in ED on 02/14/2019 with generalized abdominal pain. CT chest/Abdo/pelvis with p.o. and IV contrast which was unremarkable.  She had normal CBC, CMP, PT.  Has "severe" abdominal pain. "  She is dying".  Has lost weight.  She is not able to eat much.  On my review she has lost 2 pounds since the last visit as detailed below.  Continues to have symptoms of "reflux" with associated nausea but no vomiting.  She also has associated epigastric/left upper quadrant abdominal pain.  She "cannot do PPIs" as they make her reflux worse.  Has been using GI cocktail on as-needed basis and Tums for breakthrough.  Refused EGD in past.  Had neg UGI with small bowel follow-through as above except for reflux.  No odynophagia  or dysphagia.  Has been eating small but frequent meals.  Has been having "mushy stools" especially after contrast..  No melena or hematochezia.  Refuses colonoscopy as well.  Had virtual colonoscopy as above.  Adm 12/25/2018 to [facility] with epigastric pain.  Negative ultrasound, CT Abdo/pelvis, normal labs including CBC, CMP, lipase.  Given GI cocktail.   Was found to have UTI-treated with Rocephin and then Augmentin.  Adm to [facility] with generalized weakness 01/01/2019-negative CT head, MRI of the head. Dx again with pseudoseizures. Nl CBC, CMP, urine drug screen.  Now at home with home health care.  Has lost 3 pounds since December 2019, none since 12/2018. Wants [place] to give nutritional supplements as a prescription since she cannot afford it.  "all started after hystrectomy 04/26/2016"  Denies having any further dysphagia.  She had negative upper GI series except for reflux.  Could not tolerate any medications including Pepcid, Carafate, most of PPIs.  Currently she does take PRN Mylanta and as needed Tums.  Under considerable stress since Daughter had near fatal accident 02/2018 on [address].  Wt Readings from Last 3 Encounters:  03/30/19 73 lb (33.1 kg)  03/15/19 75 lb (34 kg)  01/19/19 75 lb (34 kg)         Past Medical History:  Diagnosis Date  . Anxiety   . Colitis   . IBS (irritable bowel syndrome)   . Varicose veins of bilateral lower extremities with pain          Past Surgical History:  Procedure Laterality Date  . ABDOMINAL HYSTERECTOMY  04/2016  . CESAREAN SECTION     x 4  . COLONOSCOPY  1995   in [place]         Family History  Problem Relation Age of Onset  . Pancreatic cancer Mother     Social History        Tobacco Use  . Smoking status: Never Smoker  . Smokeless tobacco: Never Used  Substance Use Topics  . Alcohol use: Yes    Comment: minimal  . Drug use: No          Current Outpatient Medications  Medication Sig Dispense Refill  . ALPRAZolam (XANAX) 0.5 MG tablet Take 0.5 mg by mouth 3 (three) times daily.     . Probiotic Product (PROBIOTIC DAILY PO) Take by mouth daily.     No current facility-administered medications for this visit.     No Known Allergies  Review of Systems:  Psychiatric/Behavioral:  Has anxiety or depression     Physical Exam:     Vitals:   03/30/19 1348  Weight: 73 lb (33.1 kg)  Height: 4\' 8"  (1.422 m)   Filed Weights   03/30/19 1348  Weight: 73 lb (33.1 kg)  Thin cachectic female.  Very anxious.  Crying all the time. HEENT: No jaundice no pallor Respiratory: Bilateral clear, CVS: S1-S2 normal no S3 or S4, patient has tachycardia Abdomen:  voluntary guarding, positive Carnett's sign, bowel sounds are present.  There is no definite hepatosplenomegaly. Extremities: No edema.  Labs from 02/14/2019: Hemoglobin 12.6, MCV 89, platelets 268, WBC count 5.6.  Normal CMP with BUN 17 creatinine 0.8, normal LFTs AST 32 ALT 15, albumin 4.5  CT chest Abdo/pelvis with contrast: 03/25/2019: Thickening of GE junction, constipation.  No acute abnormalities.  02/14/2019-negative for any acute abnormalities.  Report sent for scanning.  25 minutes spent with the patient today. Greater than 50% was spent in counseling and coordination of  care with the patient    Carmell Austria, MD  Cc: Dr Delena Bali

## 2019-03-30 NOTE — Telephone Encounter (Signed)
Pt is requested to be seen asap, she has an appt today at 1:30pm but pt states that she is in severe pain. She stated that she went to [facility] ED last night and they only gave her a GI cocktail. Pls call her.

## 2019-04-01 DIAGNOSIS — R109 Unspecified abdominal pain: Secondary | ICD-10-CM | POA: Diagnosis not present

## 2019-04-01 DIAGNOSIS — G8929 Other chronic pain: Secondary | ICD-10-CM | POA: Diagnosis not present

## 2019-04-01 DIAGNOSIS — F419 Anxiety disorder, unspecified: Secondary | ICD-10-CM | POA: Diagnosis not present

## 2019-04-01 LAB — URINE CULTURE

## 2019-04-02 ENCOUNTER — Encounter: Payer: Medicare Other | Admitting: Gastroenterology

## 2019-04-04 ENCOUNTER — Telehealth: Payer: Self-pay | Admitting: Gastroenterology

## 2019-04-04 DIAGNOSIS — R109 Unspecified abdominal pain: Secondary | ICD-10-CM

## 2019-04-04 DIAGNOSIS — R634 Abnormal weight loss: Secondary | ICD-10-CM

## 2019-04-04 DIAGNOSIS — K582 Mixed irritable bowel syndrome: Secondary | ICD-10-CM

## 2019-04-04 DIAGNOSIS — R627 Adult failure to thrive: Secondary | ICD-10-CM

## 2019-04-04 DIAGNOSIS — K219 Gastro-esophageal reflux disease without esophagitis: Secondary | ICD-10-CM

## 2019-04-04 NOTE — Telephone Encounter (Signed)
Left message for patient to call back to the office;  

## 2019-04-05 DIAGNOSIS — B9689 Other specified bacterial agents as the cause of diseases classified elsewhere: Secondary | ICD-10-CM | POA: Diagnosis not present

## 2019-04-05 DIAGNOSIS — F419 Anxiety disorder, unspecified: Secondary | ICD-10-CM | POA: Diagnosis not present

## 2019-04-05 DIAGNOSIS — N281 Cyst of kidney, acquired: Secondary | ICD-10-CM | POA: Diagnosis not present

## 2019-04-05 DIAGNOSIS — G8929 Other chronic pain: Secondary | ICD-10-CM | POA: Diagnosis not present

## 2019-04-05 DIAGNOSIS — R0789 Other chest pain: Secondary | ICD-10-CM | POA: Diagnosis not present

## 2019-04-05 DIAGNOSIS — R109 Unspecified abdominal pain: Secondary | ICD-10-CM | POA: Diagnosis not present

## 2019-04-05 DIAGNOSIS — N390 Urinary tract infection, site not specified: Secondary | ICD-10-CM | POA: Diagnosis not present

## 2019-04-05 NOTE — Telephone Encounter (Signed)
Lets order stool antigen for H. Pylori. If it is positive, we will treat. It is a very good test with over 94% sensitivity  RG

## 2019-04-05 NOTE — Telephone Encounter (Signed)
Called and spoke with patient- patient reports she has already spoken to Dr. Lyndel Safe and she is not going to follow through with the procedure Dr.Gupta is recommending;  Patient is requesting to be placed on an antibiotic for probable H. Pylori ? Causing the gastritis she has? Patient reports she is still in pain-wants to let Dr. Lyndel Safe know she is in so much pain that she has scared herself into an anxiety attack because of the pain she has when she eats-"my poop is mush"- Please advise

## 2019-04-05 NOTE — Telephone Encounter (Signed)
Order for h.pylori entered into Epic;  Called and spoke with patient-patient given information regarding MD recommendations and patient reports she is going to have her daughter take her to [facility] because "the pain is unbearable right now I am going to go out of my mind if the pain does not let up"; patient reports that she will ask the doctor at [facility] if they will do the H.pylori test and have the results sent to Dr. Lyndel Safe"; Patient advised to call back to the office at [phone] should questions/concerns arise; Patient verbalized understanding of information/instructions;

## 2019-04-06 ENCOUNTER — Ambulatory Visit (HOSPITAL_COMMUNITY): Payer: Medicare Other

## 2019-04-06 NOTE — Telephone Encounter (Signed)
Called and spoke with patient- patient reports she went to [facility] abd was performed-diarrhea started along with severe abd pain and the anxiety increased-patient was then informed she has a UTI-patient reports she would like for the H.Pylori test to be ordered at [facility] as she does not drive and she will need to get her daughter to take her to her appts;   H.pylori test has been ordered at [facility] via fax; confirmation of faxed received;   Called and spoke with patient- patient informed of test being ordered at [facility] and that results may take a few days as that is a "send out" test; patient advised that the lab is open with "no appt needed"; patient reports she will complete lab work as soon as her daughter can take her there; Patient advised to call back to the office at [phone] should questions/concerns arise;  Patient verbalized understanding of information/instructions;

## 2019-04-06 NOTE — Telephone Encounter (Signed)
Pt left a message with the answering service at midnight last night stating that she was in a lot of pain in her stomach and she was unable to get the stool test at [facility].

## 2019-04-06 NOTE — Telephone Encounter (Signed)
Pt would like a call back she has some questions about the test she is supposed to be doing.

## 2019-04-07 ENCOUNTER — Other Ambulatory Visit: Payer: Self-pay

## 2019-04-07 ENCOUNTER — Encounter (HOSPITAL_COMMUNITY): Payer: Self-pay

## 2019-04-07 ENCOUNTER — Emergency Department (HOSPITAL_COMMUNITY)
Admission: EM | Admit: 2019-04-07 | Discharge: 2019-04-07 | Disposition: A | Discharge status: Home / Self Care | Payer: Medicare Other | Attending: Emergency Medicine | Admitting: Emergency Medicine

## 2019-04-07 DIAGNOSIS — R634 Abnormal weight loss: Secondary | ICD-10-CM | POA: Insufficient documentation

## 2019-04-07 DIAGNOSIS — R000 Tachycardia, unspecified: Secondary | ICD-10-CM | POA: Diagnosis not present

## 2019-04-07 DIAGNOSIS — G8929 Other chronic pain: Secondary | ICD-10-CM | POA: Diagnosis not present

## 2019-04-07 DIAGNOSIS — R109 Unspecified abdominal pain: Secondary | ICD-10-CM | POA: Diagnosis not present

## 2019-04-07 DIAGNOSIS — Z79899 Other long term (current) drug therapy: Secondary | ICD-10-CM | POA: Insufficient documentation

## 2019-04-07 DIAGNOSIS — R197 Diarrhea, unspecified: Secondary | ICD-10-CM | POA: Diagnosis not present

## 2019-04-07 DIAGNOSIS — R1084 Generalized abdominal pain: Secondary | ICD-10-CM | POA: Diagnosis not present

## 2019-04-07 LAB — CBC WITH DIFFERENTIAL/PLATELET
Abs Immature Granulocytes: 0.01 10*3/uL (ref 0.00–0.07)
Basophils Absolute: 0 10*3/uL (ref 0.0–0.1)
Basophils Relative: 1 %
Eosinophils Absolute: 0 10*3/uL (ref 0.0–0.5)
Eosinophils Relative: 1 %
HCT: 37.7 % (ref 36.0–46.0)
Hemoglobin: 12.2 g/dL (ref 12.0–15.0)
Immature Granulocytes: 0 %
Lymphocytes Relative: 22 %
Lymphs Abs: 0.9 10*3/uL (ref 0.7–4.0)
MCH: 29.5 pg (ref 26.0–34.0)
MCHC: 32.4 g/dL (ref 30.0–36.0)
MCV: 91.3 fL (ref 80.0–100.0)
Monocytes Absolute: 0.5 10*3/uL (ref 0.1–1.0)
Monocytes Relative: 12 %
Neutro Abs: 2.7 10*3/uL (ref 1.7–7.7)
Neutrophils Relative %: 64 %
Platelets: 326 10*3/uL (ref 150–400)
RBC: 4.13 MIL/uL (ref 3.87–5.11)
RDW: 12.2 % (ref 11.5–15.5)
WBC: 4.2 10*3/uL (ref 4.0–10.5)
nRBC: 0 % (ref 0.0–0.2)

## 2019-04-07 LAB — URINALYSIS, ROUTINE W REFLEX MICROSCOPIC
Bacteria, UA: NONE SEEN
Bilirubin Urine: NEGATIVE
Glucose, UA: NEGATIVE mg/dL
Hgb urine dipstick: NEGATIVE
Ketones, ur: NEGATIVE mg/dL
Nitrite: NEGATIVE
Protein, ur: NEGATIVE mg/dL
Specific Gravity, Urine: 1.001 — ABNORMAL LOW (ref 1.005–1.030)
pH: 7 (ref 5.0–8.0)

## 2019-04-07 LAB — COMPREHENSIVE METABOLIC PANEL
ALT: 20 U/L (ref 0–44)
AST: 22 U/L (ref 15–41)
Albumin: 4.4 g/dL (ref 3.5–5.0)
Alkaline Phosphatase: 51 U/L (ref 38–126)
Anion gap: 11 (ref 5–15)
BUN: <5 mg/dL — ABNORMAL LOW (ref 6–20)
CO2: 26 mmol/L (ref 22–32)
Calcium: 9.5 mg/dL (ref 8.9–10.3)
Chloride: 99 mmol/L (ref 98–111)
Creatinine, Ser: 0.48 mg/dL (ref 0.44–1.00)
GFR calc Af Amer: >60 mL/min (ref >60)
GFR calc non Af Amer: >60 mL/min (ref >60)
Glucose, Bld: 95 mg/dL (ref 70–99)
Potassium: 3.4 mmol/L — ABNORMAL LOW (ref 3.5–5.1)
Sodium: 136 mmol/L (ref 135–145)
Total Bilirubin: 0.6 mg/dL (ref 0.3–1.2)
Total Protein: 7.8 g/dL (ref 6.5–8.1)

## 2019-04-07 LAB — LIPASE, BLOOD: Lipase: 25 U/L (ref 11–51)

## 2019-04-07 MED ORDER — ALUM & MAG HYDROXIDE-SIMETH 200-200-20 MG/5ML PO SUSP
30.0000 mL | Freq: Once | ORAL | Status: AC
  Administered 2019-04-07: 30 mL via ORAL
  Filled 2019-04-07: qty 30

## 2019-04-07 MED ORDER — METOCLOPRAMIDE HCL 5 MG/ML IJ SOLN
5.0000 mg | Freq: Once | INTRAMUSCULAR | Status: DC
  Filled 2019-04-07: qty 2

## 2019-04-07 MED ORDER — SODIUM CHLORIDE 0.9 % IV BOLUS
500.0000 mL | Freq: Once | INTRAVENOUS | Status: AC
  Administered 2019-04-07: 500 mL via INTRAVENOUS

## 2019-04-07 MED ORDER — SODIUM CHLORIDE 0.9% FLUSH
3.0000 mL | Freq: Once | INTRAVENOUS | Status: AC
  Administered 2019-04-07: 3 mL via INTRAVENOUS

## 2019-04-07 NOTE — ED Provider Notes (Signed)
[facility] DEPT Provider Note   CSN: [id number] Arrival date & time: 04/07/19  1658     History   Chief Complaint Chief Complaint  Patient presents with  . Abdominal Pain  . Diarrhea    HPI Donna Francis is a 58 y.o. female presenting for evaluation of abdominal pain.  Patient states she has had several months of abdominal pain.  Recently, this pain has worsened.  Due to her worsening consistent pain, she is also feeling very anxious.  Patient states she follows with Dr. Lyndel Safe from with our GI.  She called his office today, who told her to come to the ED.  Patient states she is not able to eat very much due to her pain.  Patient states she is also a lot of weight due to this.  She also states her stools are very soft, no blood in her stool.  Patient states Dr. Lyndel Safe has tried her on several different medications including Bentyl, GI cocktails, PPIs.  Currently she is on a Tums and Mylanta regimen.  She has been seen frequently at [facility] ED, most recently diagnosed with gastritis.  She is hoping to get an H. pylori stool sample performed to see if this is part of the cause of her sxs.  Patient states she has been told she should not receive any more CTs due to the amount of radiation she has had already.  She denies fevers, chills, chest pain, shortness of breath, urinary symptoms.  Additional history obtained from chart review.  Patient with a history of anxiety, colitis, fibromyalgia, IBS, interstitial cystitis, panic attacks, RA, pseudoseizures.      HPI  Past Medical History:  Diagnosis Date  . Anxiety   . Colitis   . Fibromyalgia   . IBS (irritable bowel syndrome)   . Interstitial cystitis   . Panic attacks   . Rapid heartbeat   . Rheumatoid arthritis ([place])   . Seizures ([place])   . UTI (urinary tract infection)   . Varicose veins of bilateral lower extremities with pain     Patient Active Problem List   Diagnosis Date Noted  .  Seizure-like activity ([place]) 10/18/2017    Past Surgical History:  Procedure Laterality Date  . CESAREAN SECTION     x 4  . COLONOSCOPY  1995   in [place]  . LAPAROSCOPIC HYSTERECTOMY  04/26/2016  . TUBAL LIGATION       OB History   No obstetric history on file.      Home Medications    Prior to Admission medications   Medication Sig Start Date End Date Taking? Authorizing Provider  ALPRAZolam Duanne Moron) 1 MG tablet Take 1 mg by mouth 3 (three) times daily.   Yes [provider]  Alum & Mag Hydroxide-Simeth (MYLANTA PO) Take 1 tablet by mouth as needed.   Yes [provider]  AMBULATORY NON FORMULARY MEDICATION Medication Name: GI Cocktail  ( equal parts of Viscous Lidocaine,  Bentyl, Maalox)  10 cc three times daily as needed. 03/15/19  Yes Jackquline Denmark, MD  LORazepam (ATIVAN) 1 MG tablet Take 1 mg by mouth as needed (as needed for when seizure starts to occur).    Yes [provider]  Probiotic Product ([place]) Take by mouth.   Yes [provider]  amoxicillin (AMOXIL) 500 MG capsule Take 500 mg by mouth 3 (three) times daily. 04/06/19   [provider]  hyoscyamine (LEVSIN SL) 0.125 MG SL  tablet Take one under the tongue every 6-8 hours as needed. Patient not taking: Reported on 03/30/2019 03/15/19   Jackquline Denmark, MD    Family History Family History  Problem Relation Age of Onset  . Pancreatic cancer Mother   . Diabetes Mother     Social History Social History   Tobacco Use  . Smoking status: Never Smoker  . Smokeless tobacco: Never Used  Substance Use Topics  . Alcohol use: Yes    Comment: minimal  . Drug use: No     Allergies   Patient has no known allergies.   Review of Systems Review of Systems  Constitutional: Positive for unexpected weight change.  Gastrointestinal: Positive for abdominal pain and diarrhea.  Psychiatric/Behavioral: The patient is nervous/anxious.   All other systems  reviewed and are negative.    Physical Exam Updated Vital Signs BP (!) 125/91   Pulse (!) 101   Temp 98.3 F (36.8 C) (Oral)   Resp 18   Ht 4\' 8"  (1.422 m)   Wt 33.1 kg   SpO2 100%   BMI 16.37 kg/m   Physical Exam Vitals signs and nursing note reviewed.  Constitutional:      General: She is not in acute distress.    Comments: Thin, near cachectic in appearance   HENT:     Head: Normocephalic and atraumatic.  Eyes:     Extraocular Movements: Extraocular movements intact.     Conjunctiva/sclera: Conjunctivae normal.     Pupils: Pupils are equal, round, and reactive to light.  Neck:     Musculoskeletal: Normal range of motion and neck supple.  Cardiovascular:     Rate and Rhythm: Normal rate and regular rhythm.     Pulses: Normal pulses.  Pulmonary:     Effort: Pulmonary effort is normal. No respiratory distress.     Breath sounds: Normal breath sounds. No wheezing.  Abdominal:     General: There is no distension.     Palpations: Abdomen is soft. There is no mass.     Tenderness: There is abdominal tenderness. There is no rebound.     Comments: Generalized ttp of the abd. No rigidity or distention. No peritoneal signs   Musculoskeletal: Normal range of motion.  Skin:    General: Skin is warm and dry.     Capillary Refill: Capillary refill takes less than 2 seconds.  Neurological:     Mental Status: She is alert and oriented to person, place, and time.     Comments: Patient shaking all extremities intermittently.  She states this is her pseudoseizures.  When distracted and calmed, shaking resolves      ED Treatments / Results  Labs (all labs ordered are listed, but only abnormal results are displayed) Labs Reviewed  COMPREHENSIVE METABOLIC PANEL - Abnormal; Notable for the following components:      Result Value   Potassium 3.4 (*)    BUN <5 (*)    All other components within normal limits  URINALYSIS, ROUTINE W REFLEX MICROSCOPIC - Abnormal; Notable for the  following components:   Color, Urine COLORLESS (*)    Specific Gravity, Urine 1.001 (*)    Leukocytes,Ua SMALL (*)    All other components within normal limits  LIPASE, BLOOD  CBC WITH DIFFERENTIAL/PLATELET  H. PYLORI ANTIGEN, STOOL    EKG None  Radiology No results found.  Procedures Procedures (including critical care time)  Medications Ordered in ED Medications  metoCLOPramide (REGLAN) injection 5 mg (5 mg Intravenous Refused  04/07/19 2136)  sodium chloride flush (NS) 0.9 % injection 3 mL (3 mLs Intravenous Given 04/07/19 1812)  alum & mag hydroxide-simeth (MAALOX/MYLANTA) 200-200-20 MG/5ML suspension 30 mL (30 mLs Oral Given 04/07/19 1832)  sodium chloride 0.9 % bolus 500 mL (500 mLs Intravenous New Bag/Given 04/07/19 2105)     Initial Impression / Assessment and Plan / ED Course  I have reviewed the triage vital signs and the nursing notes.  Pertinent labs & imaging results that were available during my care of the patient were reviewed by me and considered in my medical decision making (see chart for details).        Patient presenting for evaluation of chronic abdominal pain.  Sickle exam shows very thin female, however she does not appear acutely ill today.  She is tachycardic, likely due to dehydration and anxiety.  When I first arrived in the room, patient had shaking of all 4 extremities, however was answering questions appropriately.  With time and distraction, shaking resolved.  Patient states that was her pseudoseizures.  Generalized abdominal discomfort on my exam, but no focal tenderness.  Patient states she has had many CT scans, does not want anymore.  As such, will obtain screening abdominal labs.  GI cocktail given for pain control.  Patient does not want narcotics.  Labs reassuring.  No leukocytosis.  Electrolytes stable.  H. pylori stool sample ordered per patient request and per notes from Dr. Lyndel Safe.   Heart rate improved with improvement of pain and  anxiety.  P.o. challenge tried.  Patient tolerating p.o., but reporting increased nausea.  I offered Reglan, patient declined because she does not want to start any new medicines.  Discussed findings with patient.  Discussed at this time I do not see any acute or emergent need for hospitalization.  Discussed importance of follow-up with GI.  At this time, patient appears safe for discharge.  Return precautions given.  Patient states he understands and agrees to plan.   Final Clinical Impressions(s) / ED Diagnoses   Final diagnoses:  Chronic abdominal pain  Weight loss    ED Discharge Orders    None       Franchot Heidelberg, PA-C 04/07/19 2143    Valarie Merino, MD 04/08/19 1153

## 2019-04-07 NOTE — ED Notes (Addendum)
Patient called out and stated, "I have pseudo seizures and I am about to have one. Patient had seizure like activity approx 30 seconds. patieant then told writer, "I usually get Ativan 1 mg IV.Marland Kitchen Patient was asked to get into the chair so we could go to her room. Patient assited herself to the wheelchair with help . Once to the room, patient continued to have seizure-like activity and when asked to go to the bed, patient got to the bed and when asked to raise her arm to put BP cuff on, patient did so.

## 2019-04-07 NOTE — ED Notes (Signed)
Patient unable to provide stool sample.

## 2019-04-07 NOTE — ED Triage Notes (Signed)
Per EMS- Patient called the GI office that she goes to and was referred to the ED. Patient has chronic abdominal pain and diarrhea. Patient states she ws diagnosed with gastritis recently.

## 2019-04-07 NOTE — ED Notes (Signed)
Patient moved onto bedpan to attempt to provide stool sample before discharge.

## 2019-04-07 NOTE — ED Notes (Signed)
PA at bedside.

## 2019-04-07 NOTE — Discharge Instructions (Addendum)
Continue taking your home medications.  Follow-up with Dr. Steve Rattler office for further evaluation management of your abdominal pain and weight loss. Return to the emergency room if you develop high fevers, persistent vomiting, severe worsening pain, or any new, worsening, or concerning symptoms.

## 2019-04-07 NOTE — ED Notes (Signed)
Patient placed on bed pan for UA.

## 2019-04-07 NOTE — ED Notes (Signed)
Patient given [place] sandwich and apple sauce for fluid/po challenge. Patient did not eat sandwich and took a few bites of apple sauce. Pt states "this is just making me more nauseous".

## 2019-04-09 DIAGNOSIS — R109 Unspecified abdominal pain: Secondary | ICD-10-CM | POA: Diagnosis not present

## 2019-04-10 ENCOUNTER — Telehealth: Payer: Self-pay | Admitting: Gastroenterology

## 2019-04-10 NOTE — Telephone Encounter (Signed)
Results have been requested from [facility] (by Ellwood Dense be placed on your desk for review)-for the H. Pylori test that has been requested- Please advise once results have been reviewed

## 2019-04-10 NOTE — Telephone Encounter (Signed)
Patient contacted on-call GI with c/o weight loss and fatigue. Has chronic abdominal pain. Sxs all unchanged from prior. Has had extensive w/u as outlined in note by Dr. Lyndel Safe from appt earlier this month. Labs and CT from that day unremarkable. Was seen in ER again 3 nights ago for same sxs- again unrevealing labs. She submitted stool sample for H pylori and was calling to get results- no results yet in EMR for review.   As sxs are overall unchanged form prior extensive w/u, no new labs/rads ordered tonight. Can f/u H pylori results when completed, with Abx as appropriate. Otherwise, to f/u in GI clinic as previously scheduled. All questions answered and appreciative for call back.

## 2019-04-11 ENCOUNTER — Telehealth: Payer: Self-pay | Admitting: Gastroenterology

## 2019-04-11 DIAGNOSIS — F411 Generalized anxiety disorder: Secondary | ICD-10-CM | POA: Diagnosis not present

## 2019-04-11 NOTE — Telephone Encounter (Signed)
Thanks a lot RG 

## 2019-04-11 NOTE — Telephone Encounter (Signed)
Pls call pt today, she states that she is desperate because of her gi situation, she wonders if Dr. Lyndel Safe can prescribe some antibiotics in the meantime.

## 2019-04-11 NOTE — Telephone Encounter (Signed)
Trish Have we received the tests?  I do not see it on my desk. RG

## 2019-04-11 NOTE — Telephone Encounter (Signed)
Please review previous message and advise 

## 2019-04-12 NOTE — Telephone Encounter (Signed)
I would avoid antibiotics since we do not know what we are treating. Can she please get in touch with her primary care physician and check urine analysis.  She certainly can have UTI which can cause similar symptoms. Does She want to undergo EGD? - She was scheduled but she canceled. Also did we get H. pylori stool antigen results? Also would recommend to make appointment with a psychiatrist (we will let family doctor consider that) for stress/anxiety/depression.  RG

## 2019-04-12 NOTE — Telephone Encounter (Signed)
I have requested these records from [place] again.

## 2019-04-12 NOTE — Telephone Encounter (Signed)
Left message for patient to call back to the office;  

## 2019-04-13 DIAGNOSIS — R922 Inconclusive mammogram: Secondary | ICD-10-CM | POA: Diagnosis not present

## 2019-04-13 DIAGNOSIS — R109 Unspecified abdominal pain: Secondary | ICD-10-CM | POA: Diagnosis not present

## 2019-04-13 DIAGNOSIS — N644 Mastodynia: Secondary | ICD-10-CM | POA: Diagnosis not present

## 2019-04-13 DIAGNOSIS — G8929 Other chronic pain: Secondary | ICD-10-CM | POA: Diagnosis not present

## 2019-04-13 DIAGNOSIS — R1084 Generalized abdominal pain: Secondary | ICD-10-CM | POA: Diagnosis not present

## 2019-04-13 DIAGNOSIS — F419 Anxiety disorder, unspecified: Secondary | ICD-10-CM | POA: Diagnosis not present

## 2019-04-13 NOTE — Telephone Encounter (Signed)
Called No answer Left detailed message on ANS machine.  Plan: - proceed with EGD. - Small but frequent meals. - Push fluids, supplements like ensure.  RG

## 2019-04-13 NOTE — Telephone Encounter (Signed)
I have put these records on your desk for review.

## 2019-04-13 NOTE — Telephone Encounter (Signed)
Pt returned call. She is requesting a call back today, she states that she is desperate because she cannot eat anything. Pls call her.

## 2019-04-13 NOTE — Telephone Encounter (Signed)
Patient notified of the recommendations She will call [facility] to try and get the results of the stool studies faxed She is rescheduled for EGD and COVID screen.  I will mail her the instructions Dr. Lyndel Safe she would like you to call her.

## 2019-04-14 DIAGNOSIS — K2950 Unspecified chronic gastritis without bleeding: Secondary | ICD-10-CM | POA: Diagnosis not present

## 2019-04-14 DIAGNOSIS — R1012 Left upper quadrant pain: Secondary | ICD-10-CM | POA: Diagnosis not present

## 2019-04-14 DIAGNOSIS — R000 Tachycardia, unspecified: Secondary | ICD-10-CM | POA: Diagnosis not present

## 2019-04-14 DIAGNOSIS — G8929 Other chronic pain: Secondary | ICD-10-CM | POA: Diagnosis not present

## 2019-04-14 DIAGNOSIS — R1084 Generalized abdominal pain: Secondary | ICD-10-CM | POA: Diagnosis not present

## 2019-04-14 DIAGNOSIS — I10 Essential (primary) hypertension: Secondary | ICD-10-CM | POA: Diagnosis not present

## 2019-04-14 DIAGNOSIS — R531 Weakness: Secondary | ICD-10-CM | POA: Diagnosis not present

## 2019-04-14 DIAGNOSIS — J984 Other disorders of lung: Secondary | ICD-10-CM | POA: Diagnosis not present

## 2019-04-14 DIAGNOSIS — F29 Unspecified psychosis not due to a substance or known physiological condition: Secondary | ICD-10-CM | POA: Diagnosis not present

## 2019-04-16 DIAGNOSIS — R1084 Generalized abdominal pain: Secondary | ICD-10-CM | POA: Diagnosis not present

## 2019-04-16 DIAGNOSIS — Z6820 Body mass index (BMI) 20.0-20.9, adult: Secondary | ICD-10-CM | POA: Diagnosis not present

## 2019-04-16 DIAGNOSIS — F419 Anxiety disorder, unspecified: Secondary | ICD-10-CM | POA: Diagnosis not present

## 2019-04-16 DIAGNOSIS — R634 Abnormal weight loss: Secondary | ICD-10-CM | POA: Diagnosis not present

## 2019-04-16 NOTE — Telephone Encounter (Signed)
Next step with this information ?

## 2019-04-16 NOTE — Telephone Encounter (Signed)
Pt said she is returning Dr. Steve Rattler calls from last week. She stated she went to [facility] on Saturday and they found inflammation in the small intestines and she is in a lot of pain #[phone]

## 2019-04-17 NOTE — Telephone Encounter (Signed)
Pl get the records fro [facility]. Not in care everywhere. She is coming in for EGD.  Please change it to enteroscopy (we will use pediatric colonoscope) Thx  RG

## 2019-04-18 ENCOUNTER — Other Ambulatory Visit: Payer: Self-pay | Admitting: Gastroenterology

## 2019-04-18 NOTE — Telephone Encounter (Signed)
Called and spoke with patient-patient given information and has requested to be scheduled sooner for her COVID and EGD-patient has been rescheduled for COVID on 04/18/2019 at 2:20 pm; patient has been rescheduled for EGD on 04/24/2019 at 8:30 am; revised instructions has been placed at the front desk at the [facility] office and patient is aware to pick up these revised instructions prior to end of work day; Patient advised to call back to the office at [phone] should questions/concerns arise; Patient verbalized understanding of information/instructions;

## 2019-04-18 NOTE — Telephone Encounter (Signed)
Records from care everywhere reviewed.  Notes were not there yesterday. ED visit CTA 03/2019-mesenteric ischemia protocol -negative for ischemia.  Mild gastroduodenitis.  I could not pull up the actual films.    Plan : -Proceed with EGD as scheduled at [facility]. -She has canceled multiple times.  Please tell her to go ahead with EGD this time. -Continue rest of the medications -She had negative H. pylori stool antigen on 04/09/2019 at [facility].  Report sent for scanning.  RG

## 2019-04-18 NOTE — Telephone Encounter (Signed)
Stool antigen test 04/09/2019-negative. RG

## 2019-04-18 NOTE — Telephone Encounter (Signed)
Dr. Ria Comment records for [facility] are under the [facility] heading-(labs are resulted)- Enteroscopy cannot be done in [place] -if enteroscopy is needed -patient will need to be scheduled at [facility] Please advise

## 2019-04-20 LAB — SARS CORONAVIRUS 2 (TAT 6-24 HRS): SARS Coronavirus 2: NEGATIVE

## 2019-04-23 DIAGNOSIS — R1084 Generalized abdominal pain: Secondary | ICD-10-CM | POA: Diagnosis not present

## 2019-04-23 DIAGNOSIS — F419 Anxiety disorder, unspecified: Secondary | ICD-10-CM | POA: Diagnosis not present

## 2019-04-24 ENCOUNTER — Ambulatory Visit (AMBULATORY_SURGERY_CENTER): Payer: Medicare Other | Admitting: Gastroenterology

## 2019-04-24 ENCOUNTER — Encounter: Payer: Self-pay | Admitting: Gastroenterology

## 2019-04-24 ENCOUNTER — Other Ambulatory Visit: Payer: Self-pay

## 2019-04-24 VITALS — BP 123/69 | HR 115 | Temp 97.9°F | Resp 20 | Ht <= 58 in | Wt 73.0 lb

## 2019-04-24 DIAGNOSIS — K2990 Gastroduodenitis, unspecified, without bleeding: Secondary | ICD-10-CM

## 2019-04-24 DIAGNOSIS — R569 Unspecified convulsions: Secondary | ICD-10-CM | POA: Diagnosis not present

## 2019-04-24 DIAGNOSIS — R627 Adult failure to thrive: Secondary | ICD-10-CM | POA: Diagnosis not present

## 2019-04-24 DIAGNOSIS — K2950 Unspecified chronic gastritis without bleeding: Secondary | ICD-10-CM | POA: Diagnosis not present

## 2019-04-24 DIAGNOSIS — R634 Abnormal weight loss: Secondary | ICD-10-CM | POA: Diagnosis not present

## 2019-04-24 DIAGNOSIS — R1013 Epigastric pain: Secondary | ICD-10-CM

## 2019-04-24 DIAGNOSIS — K2970 Gastritis, unspecified, without bleeding: Secondary | ICD-10-CM

## 2019-04-24 DIAGNOSIS — R109 Unspecified abdominal pain: Secondary | ICD-10-CM | POA: Diagnosis not present

## 2019-04-24 MED ORDER — PANTOPRAZOLE SODIUM 20 MG PO TBEC: 20.0000 mg | DELAYED_RELEASE_TABLET | Freq: Every day | ORAL | 0 refills | Status: DC

## 2019-04-24 MED ORDER — SODIUM CHLORIDE 0.9 % IV SOLN: 500.0000 mL | Freq: Once | INTRAVENOUS | Status: DC

## 2019-04-24 NOTE — Patient Instructions (Signed)
START LOW-DOSE PANTOPRAZOLE 20 MG BY MOUTH ONCE A DAY FOR 2 WEEKS. NO ASPIRIN, IBUPROFEN, NAPROXEN, OR OTHER NON-STEROIDAL ANTI-INFLAMMATORY DRUGS. (so tylenol/acetaminophen only for OTC pain management) Dr Lyndel Safe recommends psych consultation for significant anxiety. Return to GI clinic in 6 weeks (Dr Steve Rattler nurse will call you to schedule this appointment).      OU HAD AN ENDOSCOPIC PROCEDURE TODAY AT [facility]:   Refer to the procedure report that was given to you for any specific questions about what was found during the examination.  If the procedure report does not answer your questions, please call your gastroenterologist to clarify.  If you requested that your care partner not be given the details of your procedure findings, then the procedure report has been included in a sealed envelope for you to review at your convenience later.  YOU SHOULD EXPECT: Some feelings of bloating in the abdomen. Passage of more gas than usual.  Walking can help get rid of the air that was put into your GI tract during the procedure and reduce the bloating. If you had a lower endoscopy (such as a colonoscopy or flexible sigmoidoscopy) you may notice spotting of blood in your stool or on the toilet paper. If you underwent a bowel prep for your procedure, you may not have a normal bowel movement for a few days.  Please Note:  You might notice some irritation and congestion in your nose or some drainage.  This is from the oxygen used during your procedure.  There is no need for concern and it should clear up in a day or so.  SYMPTOMS TO REPORT IMMEDIATELY:    Following upper endoscopy (EGD)  Vomiting of blood or coffee ground material  New chest pain or pain under the shoulder blades  Painful or persistently difficult swallowing  New shortness of breath  Fever of 100F or higher  Black, tarry-looking stools  For urgent or emergent issues, a gastroenterologist can be reached at any hour  by calling [phone].   DIET:  We do recommend a small meal at first, but then you may proceed to your regular diet.  Drink plenty of fluids but you should avoid alcoholic beverages for 24 hours.  ACTIVITY:  You should plan to take it easy for the rest of today and you should NOT DRIVE or use heavy machinery until tomorrow (because of the sedation medicines used during the test).    FOLLOW UP: Our staff will call the number listed on your records 48-72 hours following your procedure to check on you and address any questions or concerns that you may have regarding the information given to you following your procedure. If we do not reach you, we will leave a message.  We will attempt to reach you two times.  During this call, we will ask if you have developed any symptoms of COVID 19. If you develop any symptoms (ie: fever, flu-like symptoms, shortness of breath, cough etc.) before then, please call [phone].  If you test positive for Covid 19 in the 2 weeks post procedure, please call and report this information to [place].    If any biopsies were taken you will be contacted by phone or by letter within the next 1-3 weeks.  Please call us at [phone] if you have not heard about the biopsies in 3 weeks.    SIGNATURES/CONFIDENTIALITY: You and/or your care partner have signed paperwork which will be entered into your electronic medical record.  These  signatures attest to the fact that that the information above on your After Visit Summary has been reviewed and is understood.  Full responsibility of the confidentiality of this discharge information lies with you and/or your care-partner. 

## 2019-04-24 NOTE — Progress Notes (Signed)
VS-CW Temp JB

## 2019-04-24 NOTE — Op Note (Signed)
[facility] Patient Name: Donna Francis Procedure Date: 04/24/2019 9:17 AM MRN: [id number] Endoscopist: Jackquline Denmark , MD Age: 58 Referring MD:  Date of Birth: 10-Apr-1961 Gender: Female Account #: [id number] Procedure:                Upper GI endoscopy Indications:              Epigastric abdominal pain, weight loss with                            multiple negative CT scans Abdo/pelvis 07/27/2017,                            12/2018, 02/14/2019, 03/28/2019, 03/25/2019. Failure to                            thrive. Significant anxiety. Medicines:                Monitored Anesthesia Care Procedure:                Pre-Anesthesia Assessment:                           - Prior to the procedure, a History and Physical                            was performed, and patient medications and                            allergies were reviewed. The patient's tolerance of                            previous anesthesia was also reviewed. The risks                            and benefits of the procedure and the sedation                            options and risks were discussed with the patient.                            All questions were answered, and informed consent                            was obtained. Prior Anticoagulants: The patient has                            taken no previous anticoagulant or antiplatelet                            agents. ASA Grade Assessment: II - A patient with                            mild systemic disease. After reviewing the risks  and benefits, the patient was deemed in                            satisfactory condition to undergo the procedure.                           After obtaining informed consent, the endoscope was                            passed under direct vision. Throughout the                            procedure, the patient's blood pressure, pulse, and                            oxygen saturations were monitored  continuously. The                            Endoscope was introduced through the mouth, and                            advanced to the second part of duodenum. The upper                            GI endoscopy was accomplished without difficulty.                            The patient tolerated the procedure well. Scope In: Scope Out: Findings:                 The examined esophagus was normal.                           The Z-line was regular and was found 35 cm from the                            incisors.                           Localized minimal inflammation characterized by                            erythema was found in the gastric antrum. Biopsies                            were taken with a cold forceps for histology.                           The examined duodenum was normal. Biopsies for                            histology were taken with a cold forceps for                            evaluation of celiac  disease. Complications:            No immediate complications. Estimated Blood Loss:     Estimated blood loss: none. Impression:               -Mild gastritis.                           -Otherwise normal EGD. Recommendation:           - Patient has a contact number available for                            emergencies. The signs and symptoms of potential                            delayed complications were discussed with the                            patient. Return to normal activities tomorrow.                            Written discharge instructions were provided to the                            patient.                           - Resume previous diet. Small but more frequent                            meals. Start Ensure 1 can p.o. twice daily.                           - Start low-dose pantoprazole 20 mg p.o. once a day                            x 2 weeks.                           - Await pathology results.                           - No aspirin, ibuprofen,  naproxen, or other                            non-steroidal anti-inflammatory drugs.                           - Do recommend psych consultation for significant                            anxiety                           - Return to GI clinic in 6 weeks. Jackquline Denmark, MD 04/24/2019 9:34:58 AM This report has been signed electronically.

## 2019-04-24 NOTE — Progress Notes (Signed)
Report given to PACU, vss 

## 2019-04-26 ENCOUNTER — Encounter: Payer: Self-pay | Admitting: Gastroenterology

## 2019-04-27 ENCOUNTER — Telehealth: Payer: Self-pay

## 2019-04-27 NOTE — Telephone Encounter (Signed)
Pt called back and asked to speak to Dustin Flock, Rn - she called pt for a follow up call this am.  Pt said she talked with Jinny Blossom, but she woke her up and was "out of it". I advised pt that Jinny Blossom was at lunch and I asked if I could help her.  Pt had wanted a recap of the EGD procedure.  I asked pt to get her EGD report that was given to her on discharge paper work.  We went down the entire report and I explained she should try Dr. Steve Rattler recommendations.  That we needed to get her bx results and Dr. Lyndel Safe wanted to see pt in the office in 6 weeks.  His office will call to set up that appointment.  Pt seemed satisfied with this plan. maw

## 2019-04-27 NOTE — Telephone Encounter (Signed)
  Follow up Call-  Call back number 04/24/2019  Post procedure Call Back phone  # [phone]  Permission to leave phone message Yes  Some recent data might be hidden     Patient questions:  Do you have a fever, pain , or abdominal swelling? No. Pain Score  0 *  Have you tolerated food without any problems? Yes.    Have you been able to return to your normal activities? Yes.    Do you have any questions about your discharge instructions: Diet   No. Medications  No. Follow up visit  No.  Do you have questions or concerns about your Care? No.  Actions: * If pain score is 4 or above: No action needed, pain <4.  1. Have you developed a fever since your procedure? no  2.   Have you had an respiratory symptoms (SOB or cough) since your procedure? no  3.   Have you tested positive for COVID 19 since your procedure no  4.   Have you had any family members/close contacts diagnosed with the COVID 19 since your procedure?  no   If yes to any of these questions please route to Joylene John, RN and Alphonsa Gin, Therapist, sports.

## 2019-04-30 ENCOUNTER — Encounter: Payer: Medicare Other | Admitting: Gastroenterology

## 2019-05-03 DIAGNOSIS — R634 Abnormal weight loss: Secondary | ICD-10-CM | POA: Diagnosis not present

## 2019-05-03 DIAGNOSIS — F419 Anxiety disorder, unspecified: Secondary | ICD-10-CM | POA: Diagnosis not present

## 2019-05-03 DIAGNOSIS — F411 Generalized anxiety disorder: Secondary | ICD-10-CM | POA: Diagnosis not present

## 2019-05-03 DIAGNOSIS — K2990 Gastroduodenitis, unspecified, without bleeding: Secondary | ICD-10-CM | POA: Diagnosis not present

## 2019-05-03 DIAGNOSIS — N898 Other specified noninflammatory disorders of vagina: Secondary | ICD-10-CM | POA: Diagnosis not present

## 2019-05-07 ENCOUNTER — Telehealth (INDEPENDENT_AMBULATORY_CARE_PROVIDER_SITE_OTHER): Payer: Medicare Other | Admitting: Gastroenterology

## 2019-05-07 ENCOUNTER — Other Ambulatory Visit: Payer: Self-pay

## 2019-05-07 ENCOUNTER — Encounter: Payer: Self-pay | Admitting: Gastroenterology

## 2019-05-07 ENCOUNTER — Telehealth: Payer: Self-pay | Admitting: Gastroenterology

## 2019-05-07 DIAGNOSIS — R634 Abnormal weight loss: Secondary | ICD-10-CM | POA: Diagnosis not present

## 2019-05-07 DIAGNOSIS — Z136 Encounter for screening for cardiovascular disorders: Secondary | ICD-10-CM | POA: Diagnosis not present

## 2019-05-07 DIAGNOSIS — F4310 Post-traumatic stress disorder, unspecified: Secondary | ICD-10-CM

## 2019-05-07 DIAGNOSIS — F410 Panic disorder [episodic paroxysmal anxiety] without agoraphobia: Secondary | ICD-10-CM

## 2019-05-07 DIAGNOSIS — E785 Hyperlipidemia, unspecified: Secondary | ICD-10-CM | POA: Diagnosis not present

## 2019-05-07 DIAGNOSIS — Z1331 Encounter for screening for depression: Secondary | ICD-10-CM | POA: Diagnosis not present

## 2019-05-07 DIAGNOSIS — R1012 Left upper quadrant pain: Secondary | ICD-10-CM

## 2019-05-07 DIAGNOSIS — K582 Mixed irritable bowel syndrome: Secondary | ICD-10-CM

## 2019-05-07 DIAGNOSIS — K219 Gastro-esophageal reflux disease without esophagitis: Secondary | ICD-10-CM | POA: Diagnosis not present

## 2019-05-07 DIAGNOSIS — N959 Unspecified menopausal and perimenopausal disorder: Secondary | ICD-10-CM | POA: Diagnosis not present

## 2019-05-07 DIAGNOSIS — R1013 Epigastric pain: Secondary | ICD-10-CM

## 2019-05-07 DIAGNOSIS — G8929 Other chronic pain: Secondary | ICD-10-CM | POA: Diagnosis not present

## 2019-05-07 DIAGNOSIS — Z0000 Encounter for general adult medical examination without abnormal findings: Secondary | ICD-10-CM | POA: Diagnosis not present

## 2019-05-07 DIAGNOSIS — Z9181 History of falling: Secondary | ICD-10-CM | POA: Diagnosis not present

## 2019-05-07 DIAGNOSIS — F329 Major depressive disorder, single episode, unspecified: Secondary | ICD-10-CM

## 2019-05-07 NOTE — Telephone Encounter (Signed)
Pt would like to know whether to continue taking probiotic and inquired whether she needs antibiotics to treat her gastritis.    She stated that she has discontinued pantoprazole as it was causing her acid reflux to get worse.

## 2019-05-07 NOTE — Telephone Encounter (Signed)
Please review and advise.

## 2019-05-07 NOTE — Patient Instructions (Addendum)
If you are age 56 or older, your body mass index should be between 23-30. Your Body mass index is 15.69 kg/m. If this is out of the aforementioned range listed, please consider follow up with your Primary Care Provider.  If you are age 23 or younger, your body mass index should be between 19-25. Your Body mass index is 15.69 kg/m. If this is out of the aformentioned range listed, please consider follow up with your Primary Care Provider.   Eat small but more frequent meals  Drink Pediasure twice daily.   Follow up 12 weeks.   Thank you,  Dr. Jackquline Denmark

## 2019-05-07 NOTE — Progress Notes (Signed)
Chief Complaint: FU  Referring Provider:  Dr Delena Bali      ASSESSMENT AND PLAN;   #1.   Acute on chronic abdominal pain (since hysterectomy 04/2016). Dx with IC by urology, fibromyalgia/anxiety/depression/panic attacks/conversion disorder/pseudoseizures.  Multiple ED visits.  Multiple CT scans- neg 03/28/2019, 03/25/2019, 02/14/2019, 12/2016, 07/2017, 12/2018.   #2. Weight loss with neg multiple CTs 07/27/2017, 12/2018, 02/14/2019, 03/28/2019, 03/25/2019,. Nl CBC, CMP, TSH. Neg Virtual colonoscopy 11/22/2017. I believe situational anxiety (after hystrectomy per pt) playing a significant role.  #3. IBS with alt diarrhea and constipation. Neg stool studies, cologuard test, virtual colon. Failed bentyl.  #4. GERD with neg Ba Swallow 04/2018.  Negative EGD 04/24/2019 except for gastritis.  Failed carafate, GI coctail, pepcid, protonix, omeprazole, nexium (caused burning) ("can't do PPIs")  #5. Epigastric Pain/LUQ pain d/t fibromyalgia- neg EGD 04/24/2019/CT APs as above, [place] 12/2018, 01/2019, [place] 2019. Barium swallow/UGI with SB series on 05/15/2018-[facility]-mild to moderate gastroesophageal reflux without any stricture.  Nl stomach and small bowel.  #6. SIGNIFICANT ANXIETY/DEPRESSION/PANIC DISORDER/CONVERSION DISORDER/PTSD    Plan: -Eating better. Small but more frequent meals. -Pediasure 2/day. -FU [name] (psychology) in [place] as she has been doing -She has discontinued protonix/GI cocktail. -FU in 12 weeks.   HPI:    Donna Francis is a 58 y.o. female   For follow-up visit.  S/p EGD 04/24/2019 showing mild gastritis.  Biopsies were negative for H. Pylori.  Small bowel biopsies were negative for celiac disease.  She took Protonix for 10 days and then started having " severe burning sensation" as she has with all the rest of the PPIs.  She has stopped taking Protonix.  She is also stopped taking GI cocktail as it would cause more heartburn.  She would like to avoid taking  any medications for now.  She has been eating somewhat better.  Small but more frequently.  Has not lost anymore weight.  She has gained 1 pound.  She is under care of [name] -psychologist in [place].  She really likes her.  Situational anxiety is getting under control.  All her symptoms started after she had a hysterectomy 04/2016.       From previous records :  had multiple visits to ED at [facility] -last visit 03/28/2019.  Before that she was seen on 03/25/2019.  Had CT Abdo/pelvis on both occasions.  CT from 03/25/2019 shows mild thickened GE junction (could represent esophagitis/gastritis), moderate colonic stool burden.  She had normal CBC with hemoglobin 12.5, MCV 90.  Negative UA.  Normal CMP.  At discharge she was given Bentyl, Carafate, Protonix.  CT from 03/28/2019 - neg.  She was also seen in ED on 02/14/2019 with generalized abdominal pain. CT chest/Abdo/pelvis with p.o. and IV contrast which was unremarkable.  She had normal CBC, CMP, PT.  Has "severe" abdominal pain. " She is dying".  Has lost weight.  She is not able to eat much.  On my review she has lost 2 pounds since the last visit as detailed below.  Continues to have symptoms of "reflux" with associated nausea but no vomiting.  She also has associated epigastric/left upper quadrant abdominal pain.  She "cannot do PPIs" as they make her reflux worse.  Has been using GI cocktail on as-needed basis and Tums for breakthrough.  Refused EGD in past.  Had neg UGI with small bowel follow-through as above except for reflux.  No odynophagia or dysphagia.  Has been eating small but frequent meals.  Has  been having "mushy stools" especially after contrast..  No melena or hematochezia.  Refuses colonoscopy as well.  Had virtual colonoscopy as above.  Adm 12/25/2018 to [facility] with epigastric pain.  Negative ultrasound, CT Abdo/pelvis, normal labs including CBC, CMP, lipase.  Given GI cocktail.  Was found to have UTI-treated  with Rocephin and then Augmentin.  Adm to [facility] with generalized weakness 01/01/2019-negative CT head, MRI of the head. Dx again with pseudoseizures. Nl CBC, CMP, urine drug screen.  Now at home with home health care.  Has lost 3 pounds since December 2019, none since 12/2018. Wants [place] to give nutritional supplements as a prescription since she cannot afford it.  "all started after hystrectomy 04/26/2016"  Denies having any further dysphagia.  She had negative upper GI series except for reflux.  Could not tolerate any medications including Pepcid, Carafate, most of PPIs.  Currently she does take PRN Mylanta and as needed Tums.  Under considerable stress since Daughter had near fatal accident 02/2018 on [address].  Wt Readings from Last 3 Encounters:  05/07/19 70 lb (31.8 kg)  04/24/19 73 lb (33.1 kg)  04/07/19 73 lb (33.1 kg)         Past Medical History:  Diagnosis Date  . Anxiety   . Colitis   . IBS (irritable bowel syndrome)   . Varicose veins of bilateral lower extremities with pain          Past Surgical History:  Procedure Laterality Date  . ABDOMINAL HYSTERECTOMY  04/2016  . CESAREAN SECTION     x 4  . COLONOSCOPY  1995   in [place]         Family History  Problem Relation Age of Onset  . Pancreatic cancer Mother     Social History        Tobacco Use  . Smoking status: Never Smoker  . Smokeless tobacco: Never Used  Substance Use Topics  . Alcohol use: Yes    Comment: minimal  . Drug use: No          Current Outpatient Medications  Medication Sig Dispense Refill  . ALPRAZolam (XANAX) 0.5 MG tablet Take 0.5 mg by mouth 3 (three) times daily.     . Probiotic Product (PROBIOTIC DAILY PO) Take by mouth daily.     No current facility-administered medications for this visit.     No Known Allergies  Review of Systems:  Psychiatric/Behavioral:  Has anxiety or depression     Physical Exam:    Vitals:   05/07/19  0809  Weight: 70 lb (31.8 kg)  Height: 4\' 8"  (1.422 m)   Filed Weights   05/07/19 0809  Weight: 70 lb (31.8 kg)  Not examined since it was a televisit.  Labs from 02/14/2019: Hemoglobin 12.6, MCV 89, platelets 268, WBC count 5.6.  Normal CMP with BUN 17 creatinine 0.8, normal LFTs AST 32 ALT 15, albumin 4.5  CT chest Abdo/pelvis with contrast: 03/25/2019: Thickening of GE junction, constipation.  No acute abnormalities.  02/14/2019-negative for any acute abnormalities.  Report sent for scanning.  I connected with  Donna Francis on 05/07/19 by a televisit (patient preferred not to do video d/t unstable Internet) and verified that I am speaking with the correct person.   I discussed the limitations of evaluation and management by telemedicine. The patient expressed understanding and agreed to proceed.  25 minutes spent with the patient today. Greater than 50% was spent in counseling and coordination of care with the  patient.      Carmell Austria, MD  Cc: Dr Delena Bali

## 2019-05-08 NOTE — Telephone Encounter (Signed)
Can continue probiotics Does not need any antibiotics since bacteria test (HP) was negative. Okay to discontinue pantoprazole.  Thx  RG

## 2019-05-09 DIAGNOSIS — F411 Generalized anxiety disorder: Secondary | ICD-10-CM | POA: Diagnosis not present

## 2019-05-09 NOTE — Telephone Encounter (Signed)
Called and spoke with patient-patient informed of MD recommendations; patient is agreeable with plan of care; Patient verbalized understanding of information/instructions;  Patient was advised to call the office at [phone] if questions/concerns arise;  Patient requested to be scheduled for a phone call f/u appt on 05/29/2019 at 3:00 pm;

## 2019-05-21 ENCOUNTER — Telehealth: Payer: Self-pay | Admitting: Gastroenterology

## 2019-05-21 NOTE — Telephone Encounter (Signed)
Dr Lyndel Safe see message from the pt she will only speak directly with you.

## 2019-05-21 NOTE — Telephone Encounter (Signed)
pt said she is feeling worse since her procedure and feels like she is not getting any better. Problems with weight loss, can't eat and problems with acid reflux but her endo showed "mild acid reflux"   She has an upcoming appt on 05/29/2019 but requesting a direct call from Dr. Lyndel Safe.  She stated "she does not want to speak with Dr. Steve Rattler nurse"

## 2019-05-28 DIAGNOSIS — M85852 Other specified disorders of bone density and structure, left thigh: Secondary | ICD-10-CM | POA: Diagnosis not present

## 2019-05-28 DIAGNOSIS — N959 Unspecified menopausal and perimenopausal disorder: Secondary | ICD-10-CM | POA: Diagnosis not present

## 2019-05-29 ENCOUNTER — Other Ambulatory Visit: Payer: Self-pay

## 2019-05-29 ENCOUNTER — Encounter: Payer: Self-pay | Admitting: Gastroenterology

## 2019-05-29 ENCOUNTER — Telehealth (INDEPENDENT_AMBULATORY_CARE_PROVIDER_SITE_OTHER): Payer: Medicare Other | Admitting: Gastroenterology

## 2019-05-29 VITALS — Ht <= 58 in | Wt <= 1120 oz

## 2019-05-29 DIAGNOSIS — R109 Unspecified abdominal pain: Secondary | ICD-10-CM

## 2019-05-29 DIAGNOSIS — F410 Panic disorder [episodic paroxysmal anxiety] without agoraphobia: Secondary | ICD-10-CM | POA: Diagnosis not present

## 2019-05-29 DIAGNOSIS — K582 Mixed irritable bowel syndrome: Secondary | ICD-10-CM

## 2019-05-29 DIAGNOSIS — K219 Gastro-esophageal reflux disease without esophagitis: Secondary | ICD-10-CM | POA: Diagnosis not present

## 2019-05-29 DIAGNOSIS — F329 Major depressive disorder, single episode, unspecified: Secondary | ICD-10-CM | POA: Diagnosis not present

## 2019-05-29 DIAGNOSIS — R102 Pelvic and perineal pain: Secondary | ICD-10-CM | POA: Diagnosis not present

## 2019-05-29 DIAGNOSIS — F418 Other specified anxiety disorders: Secondary | ICD-10-CM

## 2019-05-29 DIAGNOSIS — R634 Abnormal weight loss: Secondary | ICD-10-CM | POA: Diagnosis not present

## 2019-05-29 DIAGNOSIS — G8929 Other chronic pain: Secondary | ICD-10-CM | POA: Diagnosis not present

## 2019-05-29 DIAGNOSIS — K2970 Gastritis, unspecified, without bleeding: Secondary | ICD-10-CM

## 2019-05-29 DIAGNOSIS — K589 Irritable bowel syndrome without diarrhea: Secondary | ICD-10-CM

## 2019-05-29 DIAGNOSIS — R1012 Left upper quadrant pain: Secondary | ICD-10-CM | POA: Diagnosis not present

## 2019-05-29 DIAGNOSIS — F4310 Post-traumatic stress disorder, unspecified: Secondary | ICD-10-CM

## 2019-05-29 DIAGNOSIS — R1013 Epigastric pain: Secondary | ICD-10-CM | POA: Diagnosis not present

## 2019-05-29 NOTE — Patient Instructions (Signed)
If you are age 57 or older, your body mass index should be between 23-30. Your Body mass index is 15.42 kg/m. If this is out of the aforementioned range listed, please consider follow up with your Primary Care Provider.  If you are age 34 or younger, your body mass index should be between 19-25. Your Body mass index is 15.42 kg/m. If this is out of the aformentioned range listed, please consider follow up with your Primary Care Provider.   Follow up in 12 weeks.   Thank you,  Dr. Jackquline Denmark

## 2019-05-29 NOTE — Progress Notes (Signed)
Chief Complaint: FU  Referring Provider:  Dr Delena Bali      ASSESSMENT AND PLAN;   #1.  Chronic abdo/pelvic pain (since hysterectomy 04/2016). Dx with IC by urology, fibromyalgia/anxiety/depression/panic attacks/conversion disorder/pseudoseizures.  Multiple ED visits.  Multiple neg CT scans- 03/28/2019, 03/25/2019, 02/14/2019, 12/2016, 07/2017, 12/2018.   #2. Weight loss (resolved) with neg multiple CTs 07/27/2017, 12/2018, 02/14/2019, 03/28/2019, 03/25/2019. Nl CBC, CMP, TSH. Neg Virtual colonoscopy 11/22/2017. I believe situational anxiety (after hystrectomy per pt) playing a significant role.  #3. IBS with alt diarrhea/constipation. Neg stool studies, cologuard test, virtual colon. Failed bentyl.  #4. GERD with neg Ba Swallow 04/2018.  Negative EGD 04/24/2019 except for gastritis.  Failed carafate, GI coctail, pepcid, protonix, omeprazole, nexium (caused burning) ("can't do PPIs")  #5. Epi Pain/LUQ pain d/t fibromyalgia- neg EGD 04/24/2019, CT APs as above, [place] 12/2018, 01/2019, [place] 2019. Barium swallow/UGI with SB series on 05/15/2018([facility]) mild-mod GERD without any stricture.  Nl stomach and SB.  #6. SIGNIFICANT ANXIETY/DEPRESSION/PANIC DISORDER/CONVERSION DISORDER/PTSD    Plan: -Eating better. Small but more frequent meals. -Continue Pediasure 2/day. -OK to take Maty's digestive. She has ordered from [employer]. -FU [name] (psychology) in [place] as she has been doing -She has discontinued protonix/GI cocktail. -Agree with pelvic exercises/pelvic physical therapy as suggested by GYN -FU in 12 weeks.   HPI:    Particia Francis is a 59 y.o. female   For follow-up visit.  Chronic abd pain, hearburn  S/p EGD 04/24/2019 showing mild gastritis.  Biopsies were negative for H. Pylori.  Small bowel biopsies were neg for celiac disease.  She took Protonix for 10 days and then started having " severe burning sensation" as she has with all the rest of the PPIs.  She has stopped taking  Protonix.  She is also stopped taking GI cocktail as it would cause more heartburn.  She would like to avoid taking any medications for now.  She has been eating somewhat better.  Small but more frequently.  Has not lost anymore weight.  Tolerating PediaSure well.  No diarrhea or constipation. No blood.  She is under care of [name] -psychologist in [place].  She really likes her.  Situational anxiety is getting under control.  All her symptoms started after she had a hysterectomy 04/2016.  She has been seen by GYN at [facility].  I have reviewed the notes in care everywhere.  It has been recommended to start pelvic physical therapy.  She also wants to try Maty's digestive to see if it helps.   Does not want to try any medications like Carafate.       From previous records :  had multiple visits to ED at [facility] -last visit 03/28/2019.  Before that she was seen on 03/25/2019.  Had CT Abdo/pelvis on both occasions.  CT from 03/25/2019 shows mild thickened GE junction (could represent esophagitis/gastritis), moderate colonic stool burden.  She had normal CBC with hemoglobin 12.5, MCV 90.  Negative UA.  Normal CMP.  At discharge she was given Bentyl, Carafate, Protonix.  CT from 03/28/2019 - neg.  She was also seen in ED on 02/14/2019 with generalized abdominal pain. CT chest/Abdo/pelvis with p.o. and IV contrast which was unremarkable.  She had normal CBC, CMP, PT.  Has "severe" abdominal pain. " She is dying".  Has lost weight.  She is not able to eat much.  On my review she has lost 2 pounds since the last visit as detailed below.  Continues  to have symptoms of "reflux" with associated nausea but no vomiting.  She also has associated epigastric/left upper quadrant abdominal pain.  She "cannot do PPIs" as they make her reflux worse.  Has been using GI cocktail on as-needed basis and Tums for breakthrough.  Refused EGD in past.  Had neg UGI with small bowel follow-through  as above except for reflux.  No odynophagia or dysphagia.  Has been eating small but frequent meals.  Has been having "mushy stools" especially after contrast..  No melena or hematochezia.  Refuses colonoscopy as well.  Had virtual colonoscopy as above.  Adm 12/25/2018 to [facility] with epigastric pain.  Negative ultrasound, CT Abdo/pelvis, normal labs including CBC, CMP, lipase.  Given GI cocktail.  Was found to have UTI-treated with Rocephin and then Augmentin.  Adm to [facility] with generalized weakness 01/01/2019-negative CT head, MRI of the head. Dx again with pseudoseizures. Nl CBC, CMP, urine drug screen.  Now at home with home health care.  Has lost 3 pounds since December 2019, none since 12/2018. Wants [place] to give nutritional supplements as a prescription since she cannot afford it.  "all started after hystrectomy 04/26/2016"  Denies having any further dysphagia.  She had negative upper GI series except for reflux.  Could not tolerate any medications including Pepcid, Carafate, most of PPIs.  Currently she does take PRN Mylanta and as needed Tums.  Under considerable stress since Daughter had near fatal accident 02/2018 [address].  Wt Readings from Last 3 Encounters:  05/07/19 70 lb (31.8 kg)  04/24/19 73 lb (33.1 kg)  04/07/19 73 lb (33.1 kg)         Past Medical History:  Diagnosis Date  . Anxiety   . Colitis   . IBS (irritable bowel syndrome)   . Varicose veins of bilateral lower extremities with pain          Past Surgical History:  Procedure Laterality Date  . ABDOMINAL HYSTERECTOMY  04/2016  . CESAREAN SECTION     x 4  . COLONOSCOPY  1995   in [place]         Family History  Problem Relation Age of Onset  . Pancreatic cancer Mother     Social History        Tobacco Use  . Smoking status: Never Smoker  . Smokeless tobacco: Never Used  Substance Use Topics  . Alcohol use: Yes    Comment: minimal  . Drug use: No          Current  Outpatient Medications  Medication Sig Dispense Refill  . ALPRAZolam (XANAX) 0.5 MG tablet Take 0.5 mg by mouth 3 (three) times daily.     . Probiotic Product (PROBIOTIC DAILY PO) Take by mouth daily.     No current facility-administered medications for this visit.     No Known Allergies  Review of Systems:  Psychiatric/Behavioral:  Has anxiety or depression     Physical Exam:    Vitals:   05/07/19 0809  Weight: 70 lb (31.8 kg)  Height: 4\' 8"  (1.422 m)   Filed Weights   05/07/19 0809  Weight: 70 lb (31.8 kg)  Not examined since it was a televisit.  Labs from 02/14/2019: Hemoglobin 12.6, MCV 89, platelets 268, WBC count 5.6.  Normal CMP with BUN 17 creatinine 0.8, normal LFTs AST 32 ALT 15, albumin 4.5  CT chest Abdo/pelvis with contrast: 03/25/2019: Thickening of GE junction, constipation.  No acute abnormalities.  02/14/2019-negative for any acute  abnormalities.  Report sent for scanning.  I connected with  Aleayah Tham on 05/07/19 by a televisit (patient preferred not to do video d/t unstable Internet) and verified that I am speaking with the correct person.   I discussed the limitations of evaluation and management by telemedicine. The patient expressed understanding and agreed to proceed.  25 minutes spent with the patient today. Greater than 50% was spent in counseling and coordination of care with the patient.      Carmell Austria, MD  Cc: Dr Delena Bali

## 2019-05-30 NOTE — Telephone Encounter (Signed)
Seen in the office yesterday.

## 2019-05-31 DIAGNOSIS — F411 Generalized anxiety disorder: Secondary | ICD-10-CM | POA: Diagnosis not present

## 2019-06-07 DIAGNOSIS — F411 Generalized anxiety disorder: Secondary | ICD-10-CM | POA: Diagnosis not present

## 2019-06-14 DIAGNOSIS — R102 Pelvic and perineal pain: Secondary | ICD-10-CM | POA: Diagnosis not present

## 2019-06-14 DIAGNOSIS — M797 Fibromyalgia: Secondary | ICD-10-CM | POA: Diagnosis not present

## 2019-06-14 DIAGNOSIS — G8929 Other chronic pain: Secondary | ICD-10-CM | POA: Diagnosis not present

## 2019-06-14 DIAGNOSIS — M6289 Other specified disorders of muscle: Secondary | ICD-10-CM | POA: Diagnosis not present

## 2019-06-15 DIAGNOSIS — F411 Generalized anxiety disorder: Secondary | ICD-10-CM | POA: Diagnosis not present

## 2019-06-21 DIAGNOSIS — R1084 Generalized abdominal pain: Secondary | ICD-10-CM | POA: Diagnosis not present

## 2019-06-21 DIAGNOSIS — F419 Anxiety disorder, unspecified: Secondary | ICD-10-CM | POA: Diagnosis not present

## 2019-06-21 DIAGNOSIS — Z77120 Contact with and (suspected) exposure to mold (toxic): Secondary | ICD-10-CM | POA: Diagnosis not present

## 2019-06-21 DIAGNOSIS — Z681 Body mass index (BMI) 19 or less, adult: Secondary | ICD-10-CM | POA: Diagnosis not present

## 2019-06-22 DIAGNOSIS — F411 Generalized anxiety disorder: Secondary | ICD-10-CM | POA: Diagnosis not present

## 2019-06-28 DIAGNOSIS — Z77120 Contact with and (suspected) exposure to mold (toxic): Secondary | ICD-10-CM | POA: Diagnosis not present

## 2019-06-29 DIAGNOSIS — F411 Generalized anxiety disorder: Secondary | ICD-10-CM | POA: Diagnosis not present

## 2019-07-02 DIAGNOSIS — J3089 Other allergic rhinitis: Secondary | ICD-10-CM | POA: Diagnosis not present

## 2019-07-02 DIAGNOSIS — Z77120 Contact with and (suspected) exposure to mold (toxic): Secondary | ICD-10-CM | POA: Diagnosis not present

## 2019-07-10 DIAGNOSIS — R1084 Generalized abdominal pain: Secondary | ICD-10-CM | POA: Diagnosis not present

## 2019-07-10 DIAGNOSIS — Z77120 Contact with and (suspected) exposure to mold (toxic): Secondary | ICD-10-CM | POA: Diagnosis not present

## 2019-07-19 DIAGNOSIS — R1084 Generalized abdominal pain: Secondary | ICD-10-CM | POA: Diagnosis not present

## 2019-07-23 DIAGNOSIS — R1084 Generalized abdominal pain: Secondary | ICD-10-CM | POA: Diagnosis not present

## 2019-07-23 DIAGNOSIS — R0602 Shortness of breath: Secondary | ICD-10-CM | POA: Diagnosis not present

## 2019-07-23 DIAGNOSIS — R079 Chest pain, unspecified: Secondary | ICD-10-CM | POA: Diagnosis not present

## 2019-07-23 DIAGNOSIS — R000 Tachycardia, unspecified: Secondary | ICD-10-CM | POA: Diagnosis not present

## 2019-07-23 DIAGNOSIS — R1013 Epigastric pain: Secondary | ICD-10-CM | POA: Diagnosis not present

## 2019-07-23 DIAGNOSIS — R0789 Other chest pain: Secondary | ICD-10-CM | POA: Diagnosis not present

## 2019-07-26 DIAGNOSIS — F411 Generalized anxiety disorder: Secondary | ICD-10-CM | POA: Diagnosis not present

## 2019-08-09 ENCOUNTER — Other Ambulatory Visit: Payer: Self-pay | Admitting: Gastroenterology

## 2019-08-09 DIAGNOSIS — R1013 Epigastric pain: Secondary | ICD-10-CM

## 2019-08-09 DIAGNOSIS — R1084 Generalized abdominal pain: Secondary | ICD-10-CM

## 2019-08-09 DIAGNOSIS — K589 Irritable bowel syndrome without diarrhea: Secondary | ICD-10-CM

## 2019-08-09 DIAGNOSIS — R197 Diarrhea, unspecified: Secondary | ICD-10-CM | POA: Diagnosis not present

## 2019-08-17 DIAGNOSIS — Z77120 Contact with and (suspected) exposure to mold (toxic): Secondary | ICD-10-CM | POA: Diagnosis not present

## 2019-08-17 DIAGNOSIS — M797 Fibromyalgia: Secondary | ICD-10-CM | POA: Diagnosis not present

## 2019-08-17 DIAGNOSIS — F445 Conversion disorder with seizures or convulsions: Secondary | ICD-10-CM | POA: Diagnosis not present

## 2019-08-17 DIAGNOSIS — F419 Anxiety disorder, unspecified: Secondary | ICD-10-CM | POA: Diagnosis not present

## 2019-08-17 DIAGNOSIS — R634 Abnormal weight loss: Secondary | ICD-10-CM | POA: Diagnosis not present

## 2019-08-21 DIAGNOSIS — R634 Abnormal weight loss: Secondary | ICD-10-CM | POA: Diagnosis not present

## 2019-09-07 DIAGNOSIS — R627 Adult failure to thrive: Secondary | ICD-10-CM | POA: Diagnosis not present

## 2019-09-07 DIAGNOSIS — R569 Unspecified convulsions: Secondary | ICD-10-CM | POA: Diagnosis not present

## 2019-09-07 DIAGNOSIS — R079 Chest pain, unspecified: Secondary | ICD-10-CM | POA: Diagnosis not present

## 2019-09-07 DIAGNOSIS — R9431 Abnormal electrocardiogram [ECG] [EKG]: Secondary | ICD-10-CM | POA: Diagnosis not present

## 2019-09-07 DIAGNOSIS — G40509 Epileptic seizures related to external causes, not intractable, without status epilepticus: Secondary | ICD-10-CM | POA: Diagnosis not present

## 2019-09-07 DIAGNOSIS — M797 Fibromyalgia: Secondary | ICD-10-CM | POA: Diagnosis not present

## 2019-09-12 DIAGNOSIS — R6889 Other general symptoms and signs: Secondary | ICD-10-CM | POA: Diagnosis not present

## 2019-09-12 DIAGNOSIS — F445 Conversion disorder with seizures or convulsions: Secondary | ICD-10-CM | POA: Diagnosis not present

## 2019-09-12 DIAGNOSIS — R634 Abnormal weight loss: Secondary | ICD-10-CM | POA: Diagnosis not present

## 2019-09-12 DIAGNOSIS — F419 Anxiety disorder, unspecified: Secondary | ICD-10-CM | POA: Diagnosis not present

## 2019-09-13 DIAGNOSIS — R634 Abnormal weight loss: Secondary | ICD-10-CM | POA: Diagnosis not present

## 2019-09-27 DIAGNOSIS — R634 Abnormal weight loss: Secondary | ICD-10-CM | POA: Diagnosis not present

## 2019-09-27 DIAGNOSIS — J449 Chronic obstructive pulmonary disease, unspecified: Secondary | ICD-10-CM | POA: Diagnosis not present

## 2019-10-25 DIAGNOSIS — K581 Irritable bowel syndrome with constipation: Secondary | ICD-10-CM | POA: Diagnosis not present

## 2019-10-25 DIAGNOSIS — R634 Abnormal weight loss: Secondary | ICD-10-CM | POA: Diagnosis not present

## 2019-11-20 ENCOUNTER — Telehealth: Payer: Self-pay | Admitting: Gastroenterology

## 2019-11-20 NOTE — Telephone Encounter (Signed)
Patient called states she has abd pain\ weight loss please advise

## 2019-11-22 DIAGNOSIS — M2578 Osteophyte, vertebrae: Secondary | ICD-10-CM | POA: Diagnosis not present

## 2019-11-22 DIAGNOSIS — M47812 Spondylosis without myelopathy or radiculopathy, cervical region: Secondary | ICD-10-CM | POA: Diagnosis not present

## 2019-11-22 DIAGNOSIS — R911 Solitary pulmonary nodule: Secondary | ICD-10-CM | POA: Diagnosis not present

## 2019-11-27 NOTE — Telephone Encounter (Signed)
Patient called states she would like for Dr. Lyndel Safe to call her prior to her appt on 12/27/19.

## 2019-12-03 NOTE — Telephone Encounter (Signed)
Tried calling but no answer x 2 She has a follow-up appointment RG

## 2019-12-07 DIAGNOSIS — R002 Palpitations: Secondary | ICD-10-CM | POA: Diagnosis not present

## 2019-12-07 DIAGNOSIS — R634 Abnormal weight loss: Secondary | ICD-10-CM | POA: Diagnosis not present

## 2019-12-07 DIAGNOSIS — Z681 Body mass index (BMI) 19 or less, adult: Secondary | ICD-10-CM | POA: Diagnosis not present

## 2019-12-07 DIAGNOSIS — F419 Anxiety disorder, unspecified: Secondary | ICD-10-CM | POA: Diagnosis not present

## 2019-12-07 DIAGNOSIS — R636 Underweight: Secondary | ICD-10-CM | POA: Diagnosis not present

## 2019-12-07 DIAGNOSIS — M542 Cervicalgia: Secondary | ICD-10-CM | POA: Diagnosis not present

## 2019-12-07 DIAGNOSIS — R1084 Generalized abdominal pain: Secondary | ICD-10-CM | POA: Diagnosis not present

## 2019-12-11 DIAGNOSIS — I10 Essential (primary) hypertension: Secondary | ICD-10-CM | POA: Diagnosis not present

## 2019-12-11 DIAGNOSIS — R002 Palpitations: Secondary | ICD-10-CM | POA: Diagnosis not present

## 2019-12-11 DIAGNOSIS — R000 Tachycardia, unspecified: Secondary | ICD-10-CM | POA: Diagnosis not present

## 2019-12-11 DIAGNOSIS — R1084 Generalized abdominal pain: Secondary | ICD-10-CM | POA: Diagnosis not present

## 2019-12-11 DIAGNOSIS — R109 Unspecified abdominal pain: Secondary | ICD-10-CM | POA: Diagnosis not present

## 2019-12-14 DIAGNOSIS — Q600 Renal agenesis, unilateral: Secondary | ICD-10-CM | POA: Diagnosis not present

## 2019-12-14 DIAGNOSIS — N289 Disorder of kidney and ureter, unspecified: Secondary | ICD-10-CM | POA: Diagnosis not present

## 2019-12-14 DIAGNOSIS — R1084 Generalized abdominal pain: Secondary | ICD-10-CM | POA: Diagnosis not present

## 2019-12-14 DIAGNOSIS — K828 Other specified diseases of gallbladder: Secondary | ICD-10-CM | POA: Diagnosis not present

## 2019-12-16 DIAGNOSIS — R4182 Altered mental status, unspecified: Secondary | ICD-10-CM | POA: Diagnosis not present

## 2019-12-16 DIAGNOSIS — Z8669 Personal history of other diseases of the nervous system and sense organs: Secondary | ICD-10-CM | POA: Diagnosis not present

## 2019-12-16 DIAGNOSIS — G8929 Other chronic pain: Secondary | ICD-10-CM | POA: Diagnosis not present

## 2019-12-16 DIAGNOSIS — F29 Unspecified psychosis not due to a substance or known physiological condition: Secondary | ICD-10-CM | POA: Diagnosis not present

## 2019-12-16 DIAGNOSIS — F419 Anxiety disorder, unspecified: Secondary | ICD-10-CM | POA: Diagnosis not present

## 2019-12-16 DIAGNOSIS — R918 Other nonspecific abnormal finding of lung field: Secondary | ICD-10-CM | POA: Diagnosis not present

## 2019-12-16 DIAGNOSIS — R000 Tachycardia, unspecified: Secondary | ICD-10-CM | POA: Diagnosis not present

## 2019-12-16 DIAGNOSIS — R001 Bradycardia, unspecified: Secondary | ICD-10-CM | POA: Diagnosis not present

## 2019-12-16 DIAGNOSIS — R109 Unspecified abdominal pain: Secondary | ICD-10-CM | POA: Diagnosis not present

## 2019-12-16 DIAGNOSIS — R404 Transient alteration of awareness: Secondary | ICD-10-CM | POA: Diagnosis not present

## 2019-12-17 DIAGNOSIS — R000 Tachycardia, unspecified: Secondary | ICD-10-CM | POA: Diagnosis not present

## 2019-12-17 DIAGNOSIS — E46 Unspecified protein-calorie malnutrition: Secondary | ICD-10-CM | POA: Insufficient documentation

## 2019-12-17 DIAGNOSIS — J984 Other disorders of lung: Secondary | ICD-10-CM | POA: Diagnosis not present

## 2019-12-17 DIAGNOSIS — I517 Cardiomegaly: Secondary | ICD-10-CM | POA: Diagnosis not present

## 2019-12-17 DIAGNOSIS — I4510 Unspecified right bundle-branch block: Secondary | ICD-10-CM | POA: Diagnosis not present

## 2019-12-17 DIAGNOSIS — R0789 Other chest pain: Secondary | ICD-10-CM | POA: Diagnosis not present

## 2019-12-17 DIAGNOSIS — R634 Abnormal weight loss: Secondary | ICD-10-CM | POA: Diagnosis not present

## 2019-12-17 DIAGNOSIS — Z681 Body mass index (BMI) 19 or less, adult: Secondary | ICD-10-CM | POA: Diagnosis not present

## 2019-12-17 DIAGNOSIS — R002 Palpitations: Secondary | ICD-10-CM | POA: Diagnosis not present

## 2019-12-18 DIAGNOSIS — F411 Generalized anxiety disorder: Secondary | ICD-10-CM | POA: Diagnosis not present

## 2019-12-18 DIAGNOSIS — K219 Gastro-esophageal reflux disease without esophagitis: Secondary | ICD-10-CM | POA: Diagnosis not present

## 2019-12-18 DIAGNOSIS — R1012 Left upper quadrant pain: Secondary | ICD-10-CM | POA: Diagnosis not present

## 2019-12-18 DIAGNOSIS — R000 Tachycardia, unspecified: Secondary | ICD-10-CM | POA: Diagnosis not present

## 2019-12-18 DIAGNOSIS — M069 Rheumatoid arthritis, unspecified: Secondary | ICD-10-CM | POA: Diagnosis not present

## 2019-12-18 DIAGNOSIS — K582 Mixed irritable bowel syndrome: Secondary | ICD-10-CM | POA: Diagnosis not present

## 2019-12-18 DIAGNOSIS — F4001 Agoraphobia with panic disorder: Secondary | ICD-10-CM | POA: Diagnosis not present

## 2019-12-18 DIAGNOSIS — R5383 Other fatigue: Secondary | ICD-10-CM | POA: Diagnosis not present

## 2019-12-18 DIAGNOSIS — D509 Iron deficiency anemia, unspecified: Secondary | ICD-10-CM | POA: Diagnosis not present

## 2019-12-18 DIAGNOSIS — Z79899 Other long term (current) drug therapy: Secondary | ICD-10-CM | POA: Diagnosis not present

## 2019-12-18 DIAGNOSIS — Z888 Allergy status to other drugs, medicaments and biological substances status: Secondary | ICD-10-CM | POA: Diagnosis not present

## 2019-12-18 DIAGNOSIS — M797 Fibromyalgia: Secondary | ICD-10-CM | POA: Diagnosis not present

## 2019-12-18 DIAGNOSIS — K2970 Gastritis, unspecified, without bleeding: Secondary | ICD-10-CM | POA: Diagnosis not present

## 2019-12-18 DIAGNOSIS — R569 Unspecified convulsions: Secondary | ICD-10-CM | POA: Diagnosis not present

## 2019-12-18 DIAGNOSIS — I499 Cardiac arrhythmia, unspecified: Secondary | ICD-10-CM | POA: Diagnosis not present

## 2019-12-18 DIAGNOSIS — I10 Essential (primary) hypertension: Secondary | ICD-10-CM | POA: Diagnosis not present

## 2019-12-18 DIAGNOSIS — Z800 Family history of malignant neoplasm of digestive organs: Secondary | ICD-10-CM | POA: Diagnosis not present

## 2019-12-18 DIAGNOSIS — G8929 Other chronic pain: Secondary | ICD-10-CM | POA: Diagnosis not present

## 2019-12-18 DIAGNOSIS — F329 Major depressive disorder, single episode, unspecified: Secondary | ICD-10-CM | POA: Diagnosis not present

## 2019-12-18 DIAGNOSIS — J45909 Unspecified asthma, uncomplicated: Secondary | ICD-10-CM | POA: Diagnosis not present

## 2019-12-18 DIAGNOSIS — R109 Unspecified abdominal pain: Secondary | ICD-10-CM | POA: Diagnosis not present

## 2019-12-27 ENCOUNTER — Ambulatory Visit (INDEPENDENT_AMBULATORY_CARE_PROVIDER_SITE_OTHER): Payer: Medicare Other | Admitting: Gastroenterology

## 2019-12-27 ENCOUNTER — Other Ambulatory Visit: Payer: Self-pay

## 2019-12-27 ENCOUNTER — Encounter: Payer: Self-pay | Admitting: Gastroenterology

## 2019-12-27 VITALS — BP 114/70 | HR 120 | Ht <= 58 in | Wt <= 1120 oz

## 2019-12-27 DIAGNOSIS — R109 Unspecified abdominal pain: Secondary | ICD-10-CM

## 2019-12-27 DIAGNOSIS — K219 Gastro-esophageal reflux disease without esophagitis: Secondary | ICD-10-CM

## 2019-12-27 DIAGNOSIS — K582 Mixed irritable bowel syndrome: Secondary | ICD-10-CM | POA: Diagnosis not present

## 2019-12-27 DIAGNOSIS — R634 Abnormal weight loss: Secondary | ICD-10-CM

## 2019-12-27 DIAGNOSIS — G8929 Other chronic pain: Secondary | ICD-10-CM | POA: Diagnosis not present

## 2019-12-27 NOTE — Progress Notes (Signed)
Chief Complaint: FU  Referring Provider:  Dr Delena Bali      ASSESSMENT AND PLAN;   #1. Chronic abdo/pelvic pain (since hysterectomy 04/2016). Dx with IC by urology, fibromyalgia/anxiety/depression/panic attacks/conversion disorder/pseudoseizures. Multiple ED visits. Multiple neg CT scans A/P- 03/28/2019, 03/25/2019, 02/14/2019, 12/2016, 07/2017, 12/2018.   #2. Wt loss (resolved) with neg multiple CTs 07/27/2017, 12/2018, 02/14/2019, 03/28/2019, 03/25/2019. Nl CBC, CMP, TSH. Neg Virtual colonoscopy 11/22/2017. I believe situational anxiety (after hystrectomy per pt) playing a significant role.  #3. IBS with alt diarrhea/constipation. Neg stool studies, cologuard test, virtual colon 11/2017. Failed bentyl, amitriptyline.  #4. GERD with neg Ba Swallow 04/2018.  Neg EGD 04/24/2019 except for gastritis.  Failed carafate, GI coctail, pepcid, protonix, omeprazole, nexium (caused burning) ("can't do PPIs")  #5. Epi Pain/LUQ pain d/t fibromyalgia- neg EGD 04/24/2019, CT APs as above, [place] 12/2018, 01/2019, [place] 2019. Barium swallow/UGI with SB series on 05/15/2018([facility]) mild-mod GERD without any stricture.  Nl stomach and SB.  #6. SIGNIFICANT ANXIETY/DEPRESSION/PANIC DISORDER/CONVERSION DISORDER/PTSD    Plan: -Eating better. Small but more frequent meals. -Continue Pediasure 2/day. -OK to take herbal-life protein -FU Donna Francis (psychology) in [place] as she has been doing -Can stop protonix. -Continue probiotics. -Can continue using viscus lidocaine 2% 5 ml po QD prn. -FU in 6 months.   HPI:    Donna Francis is a 59 y.o. female   For follow-up visit.  Multiple problems  -Chronic abd pain with associated nausea but no vomiting, feeling dizzy, weakness, occasional shortness of breath, fatigue, headaches, palpitations, back pain, belching, unexplained weight loss, "a lot of gas"  -Can only eat chicken, protein shake, 1 pediasure.  Tolerating small meals.   -Would have multiple normal bowel  movements 3-4 times in the morning then would rarely get constipated with pellet-like stools.  It does change its color.  Protein powder has helped with constipation.  - Has appt with cardiology appt for palpitations.   S/p EGD 04/24/2019 showing mild gastritis.  Biopsies were negative for H. Pylori.  Small bowel biopsies were neg for celiac disease.  She took Protonix for 10 days and then started having " severe burning sensation" as she has with all the rest of the PPIs.  She has stopped taking Protonix.  She has been eating somewhat better.  Small but more frequently.  Has not lost anymore weight.  Tolerating PediaSure well.  She is under care of Donna Francis -psychologist in [place].  She really likes her.  Situational anxiety is getting under control.  All her symptoms started after she had a hysterectomy 04/2016.  She has been seen by GYN at [name].  I have reviewed the notes in care everywhere.  It has been recommended to start pelvic physical therapy.  Does not want to try any medications like Carafate.  Wt Readings from Last 3 Encounters:  12/27/19 68 lb (30.8 kg)  05/29/19 70 lb (31.8 kg)  05/07/19 70 lb (31.8 kg)    Was concerned about mold allergies.       Past Medical History:  Diagnosis Date  . Anxiety   . Colitis   . IBS (irritable bowel syndrome)   . Varicose veins of bilateral lower extremities with pain          Past Surgical History:  Procedure Laterality Date  . ABDOMINAL HYSTERECTOMY  04/2016  . CESAREAN SECTION     x 4  . COLONOSCOPY  1995   in [place]  Family History  Problem Relation Age of Onset  . Pancreatic cancer Mother     Social History        Tobacco Use  . Smoking status: Never Smoker  . Smokeless tobacco: Never Used  Substance Use Topics  . Alcohol use: Yes    Comment: minimal  . Drug use: No          Current Outpatient Medications  Medication Sig Dispense Refill  . ALPRAZolam (XANAX)  0.5 MG tablet Take 0.5 mg by mouth 3 (three) times daily.     . Probiotic Product (PROBIOTIC DAILY PO) Take by mouth daily.     No current facility-administered medications for this visit.     No Known Allergies  Review of Systems:  Psychiatric/Behavioral:  Has anxiety or depression     Physical Exam:    Vitals:   05/07/19 0809  Weight: 70 lb (31.8 kg)  Height: 4\' 8"  (1.422 m)   Filed Weights   05/07/19 0809  Weight: 70 lb (31.8 kg)   Gen: awake, alert, NAD HEENT: anicteric, no pallor CV: RRR, no mrg Pulm: CTA b/l Abd: soft, NT/ND, +BS throughout Ext: no c/c/e Neuro: nonfocal     Carmell Austria, MD  Cc: Dr Delena Bali

## 2019-12-27 NOTE — Patient Instructions (Signed)
If you are age 57 or older, your body mass index should be between 23-30. Your Body mass index is 15.25 kg/m. If this is out of the aforementioned range listed, please consider follow up with your Primary Care Provider.  If you are age 63 or younger, your body mass index should be between 19-25. Your Body mass index is 15.25 kg/m. If this is out of the aformentioned range listed, please consider follow up with your Primary Care Provider.   Eat small but frequent meals.  Continue Pediasure 2 daily.  Okay to take herbal-life protein.  Follow up with [name] (phychologist)  Can stop Protonix.  Continue probiotics.  Can continue using viscus lidocaine 2% 5 ml daily as needed.  Follow up in six months  Please call the office for an appointment as the schedule is not available at this time.  Thank you,  Dr. Jackquline Denmark

## 2020-01-03 DIAGNOSIS — I361 Nonrheumatic tricuspid (valve) insufficiency: Secondary | ICD-10-CM | POA: Diagnosis not present

## 2020-01-16 DIAGNOSIS — F411 Generalized anxiety disorder: Secondary | ICD-10-CM | POA: Diagnosis not present

## 2020-01-21 DIAGNOSIS — J020 Streptococcal pharyngitis: Secondary | ICD-10-CM | POA: Diagnosis not present

## 2020-01-21 DIAGNOSIS — Z20822 Contact with and (suspected) exposure to covid-19: Secondary | ICD-10-CM | POA: Diagnosis not present

## 2020-01-22 DIAGNOSIS — E871 Hypo-osmolality and hyponatremia: Secondary | ICD-10-CM | POA: Diagnosis not present

## 2020-01-22 DIAGNOSIS — R070 Pain in throat: Secondary | ICD-10-CM | POA: Diagnosis not present

## 2020-01-22 DIAGNOSIS — R079 Chest pain, unspecified: Secondary | ICD-10-CM | POA: Diagnosis not present

## 2020-01-22 DIAGNOSIS — G4489 Other headache syndrome: Secondary | ICD-10-CM | POA: Diagnosis not present

## 2020-01-22 DIAGNOSIS — J029 Acute pharyngitis, unspecified: Secondary | ICD-10-CM | POA: Diagnosis not present

## 2020-01-22 DIAGNOSIS — R110 Nausea: Secondary | ICD-10-CM | POA: Diagnosis not present

## 2020-01-22 DIAGNOSIS — R5383 Other fatigue: Secondary | ICD-10-CM | POA: Diagnosis not present

## 2020-01-22 DIAGNOSIS — R05 Cough: Secondary | ICD-10-CM | POA: Diagnosis not present

## 2020-01-22 DIAGNOSIS — R531 Weakness: Secondary | ICD-10-CM | POA: Diagnosis not present

## 2020-01-22 DIAGNOSIS — R0789 Other chest pain: Secondary | ICD-10-CM | POA: Diagnosis not present

## 2020-01-24 DIAGNOSIS — R002 Palpitations: Secondary | ICD-10-CM | POA: Diagnosis not present

## 2020-01-25 DIAGNOSIS — I493 Ventricular premature depolarization: Secondary | ICD-10-CM | POA: Diagnosis not present

## 2020-01-25 DIAGNOSIS — R002 Palpitations: Secondary | ICD-10-CM | POA: Diagnosis not present

## 2020-01-25 DIAGNOSIS — I491 Atrial premature depolarization: Secondary | ICD-10-CM | POA: Diagnosis not present

## 2020-01-31 DIAGNOSIS — F419 Anxiety disorder, unspecified: Secondary | ICD-10-CM | POA: Diagnosis not present

## 2020-01-31 DIAGNOSIS — Z681 Body mass index (BMI) 19 or less, adult: Secondary | ICD-10-CM | POA: Diagnosis not present

## 2020-01-31 DIAGNOSIS — E871 Hypo-osmolality and hyponatremia: Secondary | ICD-10-CM | POA: Diagnosis not present

## 2020-02-11 DIAGNOSIS — F419 Anxiety disorder, unspecified: Secondary | ICD-10-CM | POA: Diagnosis not present

## 2020-02-11 DIAGNOSIS — I479 Paroxysmal tachycardia, unspecified: Secondary | ICD-10-CM | POA: Diagnosis not present

## 2020-02-11 DIAGNOSIS — Z681 Body mass index (BMI) 19 or less, adult: Secondary | ICD-10-CM | POA: Diagnosis not present

## 2020-02-14 DIAGNOSIS — R1310 Dysphagia, unspecified: Secondary | ICD-10-CM | POA: Diagnosis not present

## 2020-02-14 DIAGNOSIS — R1084 Generalized abdominal pain: Secondary | ICD-10-CM | POA: Diagnosis not present

## 2020-02-14 DIAGNOSIS — Z681 Body mass index (BMI) 19 or less, adult: Secondary | ICD-10-CM | POA: Diagnosis not present

## 2020-02-14 DIAGNOSIS — I479 Paroxysmal tachycardia, unspecified: Secondary | ICD-10-CM | POA: Diagnosis not present

## 2020-02-14 DIAGNOSIS — F419 Anxiety disorder, unspecified: Secondary | ICD-10-CM | POA: Diagnosis not present

## 2020-02-20 DIAGNOSIS — E871 Hypo-osmolality and hyponatremia: Secondary | ICD-10-CM | POA: Diagnosis not present

## 2020-02-20 DIAGNOSIS — K2950 Unspecified chronic gastritis without bleeding: Secondary | ICD-10-CM | POA: Diagnosis not present

## 2020-02-20 DIAGNOSIS — R5381 Other malaise: Secondary | ICD-10-CM | POA: Diagnosis not present

## 2020-02-20 DIAGNOSIS — R05 Cough: Secondary | ICD-10-CM | POA: Diagnosis not present

## 2020-02-21 ENCOUNTER — Telehealth: Payer: Self-pay | Admitting: Gastroenterology

## 2020-02-21 NOTE — Telephone Encounter (Signed)
Spoke to patient who will try OTC Pepcid 20 mg as she does not tolerate Protonix. She will call our office next week to report her results. She has a follow up appointment with Dr Lyndel Safe.

## 2020-02-21 NOTE — Telephone Encounter (Signed)
LMOM for patient to call back.

## 2020-02-21 NOTE — Telephone Encounter (Signed)
Patient is requesting to speak with a nurse states she is feeling worst

## 2020-02-28 DIAGNOSIS — Z87898 Personal history of other specified conditions: Secondary | ICD-10-CM | POA: Diagnosis not present

## 2020-02-28 DIAGNOSIS — K2970 Gastritis, unspecified, without bleeding: Secondary | ICD-10-CM | POA: Diagnosis not present

## 2020-02-28 DIAGNOSIS — R0981 Nasal congestion: Secondary | ICD-10-CM | POA: Diagnosis not present

## 2020-02-28 DIAGNOSIS — J342 Deviated nasal septum: Secondary | ICD-10-CM | POA: Diagnosis not present

## 2020-02-28 DIAGNOSIS — F419 Anxiety disorder, unspecified: Secondary | ICD-10-CM | POA: Diagnosis not present

## 2020-02-28 DIAGNOSIS — J343 Hypertrophy of nasal turbinates: Secondary | ICD-10-CM | POA: Diagnosis not present

## 2020-02-28 DIAGNOSIS — R634 Abnormal weight loss: Secondary | ICD-10-CM | POA: Diagnosis not present

## 2020-02-28 DIAGNOSIS — R059 Cough, unspecified: Secondary | ICD-10-CM | POA: Diagnosis not present

## 2020-02-28 DIAGNOSIS — Z8709 Personal history of other diseases of the respiratory system: Secondary | ICD-10-CM | POA: Diagnosis not present

## 2020-02-28 DIAGNOSIS — J312 Chronic pharyngitis: Secondary | ICD-10-CM | POA: Diagnosis not present

## 2020-03-03 ENCOUNTER — Telehealth: Payer: Self-pay | Admitting: Gastroenterology

## 2020-03-03 NOTE — Telephone Encounter (Signed)
She should follow-up with cardiology if she did not have cardiac stress test done recently d/t chest pains. She has been controlling reflux with Mylanta.  Must continue to do it on as needed basis.  She could not tolerate PPIs/GI cocktail/Carafate in the past. We will address more at the follow-up visit. RG

## 2020-03-03 NOTE — Telephone Encounter (Signed)
Spoke to patient who reports Epi pain and non cardiac chest pain. She states that she took a 1/2 of Prilosec 20 mg today with some relief. Normally she cannot tolerate PPI's due to increased burning sensation. Patient does have extreme anxiety and panic attacks. She went to [place] cardiology for chest pain CXR showed Single frontal view of the chest demonstrates a stable cardiac  silhouette. No airspace disease, effusion, or pneumothorax. No acute  bony abnormalities. She also saw an ENT.She states that they did not find anything besides post nasal drip. Patient is requesting another Barium swallow or some medication to help her CP.She has a GI appointment next month. Dr Lyndel Safe please advise

## 2020-03-03 NOTE — Telephone Encounter (Signed)
Spoke to patient to inform her of dr Leland Her recommendations. She will contact Cardiology and inquire about a cardiac stress test.

## 2020-03-05 DIAGNOSIS — R0602 Shortness of breath: Secondary | ICD-10-CM | POA: Diagnosis not present

## 2020-03-05 DIAGNOSIS — E871 Hypo-osmolality and hyponatremia: Secondary | ICD-10-CM | POA: Diagnosis not present

## 2020-03-05 DIAGNOSIS — R410 Disorientation, unspecified: Secondary | ICD-10-CM | POA: Diagnosis not present

## 2020-03-05 DIAGNOSIS — K219 Gastro-esophageal reflux disease without esophagitis: Secondary | ICD-10-CM | POA: Diagnosis not present

## 2020-03-05 DIAGNOSIS — R55 Syncope and collapse: Secondary | ICD-10-CM | POA: Diagnosis not present

## 2020-03-05 DIAGNOSIS — R079 Chest pain, unspecified: Secondary | ICD-10-CM | POA: Diagnosis not present

## 2020-03-05 DIAGNOSIS — R0789 Other chest pain: Secondary | ICD-10-CM | POA: Diagnosis not present

## 2020-03-05 DIAGNOSIS — R072 Precordial pain: Secondary | ICD-10-CM | POA: Diagnosis not present

## 2020-03-05 DIAGNOSIS — R404 Transient alteration of awareness: Secondary | ICD-10-CM | POA: Diagnosis not present

## 2020-03-05 DIAGNOSIS — E43 Unspecified severe protein-calorie malnutrition: Secondary | ICD-10-CM | POA: Diagnosis not present

## 2020-03-06 DIAGNOSIS — E43 Unspecified severe protein-calorie malnutrition: Secondary | ICD-10-CM | POA: Diagnosis not present

## 2020-03-06 DIAGNOSIS — Z79899 Other long term (current) drug therapy: Secondary | ICD-10-CM | POA: Diagnosis not present

## 2020-03-06 DIAGNOSIS — E860 Dehydration: Secondary | ICD-10-CM | POA: Diagnosis not present

## 2020-03-06 DIAGNOSIS — F319 Bipolar disorder, unspecified: Secondary | ICD-10-CM | POA: Diagnosis present

## 2020-03-06 DIAGNOSIS — R0602 Shortness of breath: Secondary | ICD-10-CM | POA: Diagnosis not present

## 2020-03-06 DIAGNOSIS — K3189 Other diseases of stomach and duodenum: Secondary | ICD-10-CM | POA: Diagnosis not present

## 2020-03-06 DIAGNOSIS — R000 Tachycardia, unspecified: Secondary | ICD-10-CM | POA: Diagnosis present

## 2020-03-06 DIAGNOSIS — R630 Anorexia: Secondary | ICD-10-CM | POA: Diagnosis present

## 2020-03-06 DIAGNOSIS — R55 Syncope and collapse: Secondary | ICD-10-CM | POA: Diagnosis not present

## 2020-03-06 DIAGNOSIS — E871 Hypo-osmolality and hyponatremia: Secondary | ICD-10-CM | POA: Diagnosis not present

## 2020-03-06 DIAGNOSIS — Z8719 Personal history of other diseases of the digestive system: Secondary | ICD-10-CM | POA: Diagnosis not present

## 2020-03-06 DIAGNOSIS — Z681 Body mass index (BMI) 19 or less, adult: Secondary | ICD-10-CM | POA: Diagnosis not present

## 2020-03-06 DIAGNOSIS — K30 Functional dyspepsia: Secondary | ICD-10-CM | POA: Diagnosis not present

## 2020-03-06 DIAGNOSIS — M1990 Unspecified osteoarthritis, unspecified site: Secondary | ICD-10-CM | POA: Diagnosis present

## 2020-03-06 DIAGNOSIS — K219 Gastro-esophageal reflux disease without esophagitis: Secondary | ICD-10-CM | POA: Diagnosis not present

## 2020-03-06 DIAGNOSIS — R1013 Epigastric pain: Secondary | ICD-10-CM | POA: Diagnosis not present

## 2020-03-06 DIAGNOSIS — E785 Hyperlipidemia, unspecified: Secondary | ICD-10-CM | POA: Diagnosis present

## 2020-03-06 DIAGNOSIS — Z888 Allergy status to other drugs, medicaments and biological substances status: Secondary | ICD-10-CM | POA: Diagnosis not present

## 2020-03-06 DIAGNOSIS — M069 Rheumatoid arthritis, unspecified: Secondary | ICD-10-CM | POA: Diagnosis present

## 2020-03-06 DIAGNOSIS — F445 Conversion disorder with seizures or convulsions: Secondary | ICD-10-CM | POA: Diagnosis not present

## 2020-03-06 DIAGNOSIS — R079 Chest pain, unspecified: Secondary | ICD-10-CM | POA: Diagnosis not present

## 2020-03-06 DIAGNOSIS — J45909 Unspecified asthma, uncomplicated: Secondary | ICD-10-CM | POA: Diagnosis present

## 2020-03-06 DIAGNOSIS — R072 Precordial pain: Secondary | ICD-10-CM | POA: Diagnosis not present

## 2020-03-06 DIAGNOSIS — K589 Irritable bowel syndrome without diarrhea: Secondary | ICD-10-CM | POA: Diagnosis present

## 2020-03-06 DIAGNOSIS — R0789 Other chest pain: Secondary | ICD-10-CM | POA: Diagnosis not present

## 2020-03-06 DIAGNOSIS — R634 Abnormal weight loss: Secondary | ICD-10-CM | POA: Diagnosis not present

## 2020-03-06 DIAGNOSIS — F411 Generalized anxiety disorder: Secondary | ICD-10-CM | POA: Diagnosis present

## 2020-03-06 DIAGNOSIS — Z01810 Encounter for preprocedural cardiovascular examination: Secondary | ICD-10-CM | POA: Diagnosis not present

## 2020-03-07 DIAGNOSIS — R634 Abnormal weight loss: Secondary | ICD-10-CM | POA: Diagnosis not present

## 2020-03-07 DIAGNOSIS — K30 Functional dyspepsia: Secondary | ICD-10-CM | POA: Diagnosis not present

## 2020-03-07 DIAGNOSIS — R0789 Other chest pain: Secondary | ICD-10-CM | POA: Diagnosis not present

## 2020-03-07 DIAGNOSIS — Z01810 Encounter for preprocedural cardiovascular examination: Secondary | ICD-10-CM

## 2020-03-07 HISTORY — PX: ESOPHAGOGASTRODUODENOSCOPY: SHX1529

## 2020-03-08 DIAGNOSIS — R0789 Other chest pain: Secondary | ICD-10-CM

## 2020-03-08 DIAGNOSIS — R1013 Epigastric pain: Secondary | ICD-10-CM

## 2020-03-17 DIAGNOSIS — Z681 Body mass index (BMI) 19 or less, adult: Secondary | ICD-10-CM | POA: Diagnosis not present

## 2020-03-17 DIAGNOSIS — Z1231 Encounter for screening mammogram for malignant neoplasm of breast: Secondary | ICD-10-CM | POA: Diagnosis not present

## 2020-03-17 DIAGNOSIS — Z79899 Other long term (current) drug therapy: Secondary | ICD-10-CM | POA: Diagnosis not present

## 2020-03-17 DIAGNOSIS — R059 Cough, unspecified: Secondary | ICD-10-CM | POA: Diagnosis not present

## 2020-03-17 DIAGNOSIS — E46 Unspecified protein-calorie malnutrition: Secondary | ICD-10-CM | POA: Diagnosis not present

## 2020-03-17 DIAGNOSIS — J309 Allergic rhinitis, unspecified: Secondary | ICD-10-CM | POA: Diagnosis not present

## 2020-03-17 DIAGNOSIS — R350 Frequency of micturition: Secondary | ICD-10-CM | POA: Diagnosis not present

## 2020-03-17 DIAGNOSIS — F419 Anxiety disorder, unspecified: Secondary | ICD-10-CM | POA: Diagnosis not present

## 2020-03-18 DIAGNOSIS — F411 Generalized anxiety disorder: Secondary | ICD-10-CM | POA: Diagnosis not present

## 2020-03-24 DIAGNOSIS — F411 Generalized anxiety disorder: Secondary | ICD-10-CM | POA: Diagnosis not present

## 2020-03-27 DIAGNOSIS — R457 State of emotional shock and stress, unspecified: Secondary | ICD-10-CM | POA: Diagnosis not present

## 2020-03-27 DIAGNOSIS — R0789 Other chest pain: Secondary | ICD-10-CM | POA: Diagnosis not present

## 2020-03-27 DIAGNOSIS — R079 Chest pain, unspecified: Secondary | ICD-10-CM | POA: Diagnosis not present

## 2020-03-27 DIAGNOSIS — R0689 Other abnormalities of breathing: Secondary | ICD-10-CM | POA: Diagnosis not present

## 2020-03-27 DIAGNOSIS — I10 Essential (primary) hypertension: Secondary | ICD-10-CM | POA: Diagnosis not present

## 2020-03-27 DIAGNOSIS — R000 Tachycardia, unspecified: Secondary | ICD-10-CM | POA: Diagnosis not present

## 2020-04-01 ENCOUNTER — Telehealth: Payer: Self-pay | Admitting: Gastroenterology

## 2020-04-02 DIAGNOSIS — F411 Generalized anxiety disorder: Secondary | ICD-10-CM | POA: Diagnosis not present

## 2020-04-02 NOTE — Telephone Encounter (Signed)
I have changed patients appointment to virtual, I have left a message for patient.

## 2020-04-02 NOTE — Telephone Encounter (Signed)
Please advise 

## 2020-04-02 NOTE — Telephone Encounter (Signed)
That should be fine RG

## 2020-04-04 ENCOUNTER — Telehealth: Payer: Self-pay | Admitting: Cardiology

## 2020-04-04 NOTE — Telephone Encounter (Signed)
° ° ° °  Pt is calling to follow up her f/u appt with Dr. Harriet Masson. She said she saw Dr. Harriet Masson and Dr. Geraldo Pitter when she was in [facility]. She needs an appt for f/u and for a test. She dont remember what kind of test but she was told she needs it

## 2020-04-07 DIAGNOSIS — F411 Generalized anxiety disorder: Secondary | ICD-10-CM | POA: Diagnosis not present

## 2020-04-11 DIAGNOSIS — R002 Palpitations: Secondary | ICD-10-CM | POA: Diagnosis not present

## 2020-04-11 DIAGNOSIS — Z681 Body mass index (BMI) 19 or less, adult: Secondary | ICD-10-CM | POA: Diagnosis not present

## 2020-04-11 DIAGNOSIS — R636 Underweight: Secondary | ICD-10-CM | POA: Diagnosis not present

## 2020-04-11 DIAGNOSIS — R0789 Other chest pain: Secondary | ICD-10-CM | POA: Diagnosis not present

## 2020-04-11 DIAGNOSIS — I479 Paroxysmal tachycardia, unspecified: Secondary | ICD-10-CM | POA: Diagnosis not present

## 2020-04-11 DIAGNOSIS — F419 Anxiety disorder, unspecified: Secondary | ICD-10-CM | POA: Diagnosis not present

## 2020-04-11 DIAGNOSIS — R1310 Dysphagia, unspecified: Secondary | ICD-10-CM | POA: Diagnosis not present

## 2020-04-11 DIAGNOSIS — Z1231 Encounter for screening mammogram for malignant neoplasm of breast: Secondary | ICD-10-CM | POA: Diagnosis not present

## 2020-04-14 ENCOUNTER — Other Ambulatory Visit: Payer: Self-pay

## 2020-04-14 ENCOUNTER — Telehealth: Payer: Medicare Other | Admitting: Gastroenterology

## 2020-04-14 DIAGNOSIS — M069 Rheumatoid arthritis, unspecified: Secondary | ICD-10-CM | POA: Insufficient documentation

## 2020-04-14 DIAGNOSIS — M797 Fibromyalgia: Secondary | ICD-10-CM | POA: Insufficient documentation

## 2020-04-14 DIAGNOSIS — K589 Irritable bowel syndrome without diarrhea: Secondary | ICD-10-CM | POA: Insufficient documentation

## 2020-04-14 DIAGNOSIS — R109 Unspecified abdominal pain: Secondary | ICD-10-CM | POA: Insufficient documentation

## 2020-04-14 DIAGNOSIS — K529 Noninfective gastroenteritis and colitis, unspecified: Secondary | ICD-10-CM | POA: Insufficient documentation

## 2020-04-14 DIAGNOSIS — F410 Panic disorder [episodic paroxysmal anxiety] without agoraphobia: Secondary | ICD-10-CM | POA: Insufficient documentation

## 2020-04-14 DIAGNOSIS — N390 Urinary tract infection, site not specified: Secondary | ICD-10-CM | POA: Insufficient documentation

## 2020-04-14 DIAGNOSIS — I83813 Varicose veins of bilateral lower extremities with pain: Secondary | ICD-10-CM | POA: Insufficient documentation

## 2020-04-14 DIAGNOSIS — R569 Unspecified convulsions: Secondary | ICD-10-CM | POA: Insufficient documentation

## 2020-04-14 DIAGNOSIS — N3010 Interstitial cystitis (chronic) without hematuria: Secondary | ICD-10-CM | POA: Insufficient documentation

## 2020-04-14 DIAGNOSIS — R000 Tachycardia, unspecified: Secondary | ICD-10-CM | POA: Insufficient documentation

## 2020-04-14 DIAGNOSIS — T7840XA Allergy, unspecified, initial encounter: Secondary | ICD-10-CM | POA: Insufficient documentation

## 2020-04-14 DIAGNOSIS — R634 Abnormal weight loss: Secondary | ICD-10-CM | POA: Insufficient documentation

## 2020-04-14 DIAGNOSIS — R0789 Other chest pain: Secondary | ICD-10-CM | POA: Insufficient documentation

## 2020-04-14 DIAGNOSIS — J45909 Unspecified asthma, uncomplicated: Secondary | ICD-10-CM | POA: Insufficient documentation

## 2020-04-14 DIAGNOSIS — K219 Gastro-esophageal reflux disease without esophagitis: Secondary | ICD-10-CM | POA: Insufficient documentation

## 2020-04-15 ENCOUNTER — Telehealth: Payer: Self-pay | Admitting: Gastroenterology

## 2020-04-15 NOTE — Telephone Encounter (Signed)
Patient left message with answering service yesterday after hours inquiring why nobody called her for her virtual appt at 3:40pm.  Please call patient.

## 2020-04-15 NOTE — Telephone Encounter (Signed)
Patient has been rescheduled for Monday 04/21/20 at 9:50am. Patient was informed that we tried to contact her yesterday about her appointment but was unable to reach her.

## 2020-04-16 ENCOUNTER — Encounter: Payer: Self-pay | Admitting: Cardiology

## 2020-04-16 ENCOUNTER — Ambulatory Visit (INDEPENDENT_AMBULATORY_CARE_PROVIDER_SITE_OTHER): Payer: Medicare Other | Admitting: Cardiology

## 2020-04-16 ENCOUNTER — Other Ambulatory Visit: Payer: Self-pay

## 2020-04-16 VITALS — BP 126/86 | HR 127 | Ht <= 58 in | Wt 72.6 lb

## 2020-04-16 DIAGNOSIS — R000 Tachycardia, unspecified: Secondary | ICD-10-CM

## 2020-04-16 DIAGNOSIS — E782 Mixed hyperlipidemia: Secondary | ICD-10-CM

## 2020-04-16 DIAGNOSIS — R002 Palpitations: Secondary | ICD-10-CM | POA: Diagnosis not present

## 2020-04-16 DIAGNOSIS — R072 Precordial pain: Secondary | ICD-10-CM | POA: Insufficient documentation

## 2020-04-16 DIAGNOSIS — R0602 Shortness of breath: Secondary | ICD-10-CM | POA: Diagnosis not present

## 2020-04-16 MED ORDER — METOPROLOL TARTRATE 100 MG PO TABS: ORAL_TABLET | ORAL | 0 refills | Status: DC

## 2020-04-16 NOTE — Progress Notes (Signed)
Cardiology Office Note:    Date:  04/16/2020   ID:  Donna Francis, DOB 10-14-1960, MRN [id number]  PCP:  Lowella Dandy, NP  Cardiologist:  No primary care provider on file.  Electrophysiologist:  None   Referring MD: Lowella Dandy, NP  " I have had some shortness of breath and intermittent chest discomfort"   History of Present Illness:    Donna Francis is a 59 y.o. female with a hx of pseudoseizures, GERD, erosive gastritis, anxiety presents today posthospitalization.  The patient was recently admitted and [facility] for chest pain.  At that time she presented with a substernal discomfort to feel mucus spasm-like sensation.  She was seen by GI she was worked up by endoscopy which did not show any significant findings.  The patient had previously prior to her admission seen Dr. Orlinda Blalock at [facility] for the same pain and was recommended to get a nuclear stress test but she had declined. During her hospitalization she also had a CT scan which showed pulmonary nodules opacities essentially stable bilaterally measuring 7 x 7 mm.  Stability since the 2019 study.  She also did have hepatic steatosis. The patient was discharged She tells me since her discharge she has had intermittent midsternal chest discomfort.  She notes that was new is the fact that she experiencing progressive shortness of breath.  She has had longstanding tachycardia she tells me and she had not taking any medication for this at her request.   Past Medical History:  Diagnosis Date  . Allergy   . Anemia   . Anxiety   . Asthma    pt states diagnosed by Pulmonologist  . Colitis   . Fibromyalgia   . GERD (gastroesophageal reflux disease)    pt reports buringing in abdomen that has recently started moving up higher into her chest  . IBS (irritable bowel syndrome)   . Interstitial cystitis   . Panic attacks   . Rapid heartbeat   . Rheumatoid arthritis ([place])   . Seizures ([place])   . UTI (urinary tract  infection)   . Varicose veins of bilateral lower extremities with pain     Past Surgical History:  Procedure Laterality Date  . CESAREAN SECTION     x 4  . COLONOSCOPY  1995   in [place]  . ESOPHAGOGASTRODUODENOSCOPY  03/07/2020   [facility] Dr Lyda Jester. Normal upper endoscopy  . LAPAROSCOPIC HYSTERECTOMY  04/26/2016  . TUBAL LIGATION      Current Medications: Current Meds  Medication Sig  . acetaminophen (TYLENOL) 500 MG tablet Take 250-500 mg by mouth as needed.  . ALPRAZolam (XANAX) 1 MG tablet Take 1 mg by mouth QID.   Marland Kitchen Alum & Mag Hydroxide-Simeth (MYLANTA PO) Take 1 tablet by mouth as needed.  . loratadine (CLARITIN) 5 MG chewable tablet Chew 10 mg by mouth daily.  Marland Kitchen LORazepam (ATIVAN) 1 MG tablet Take 1 mg by mouth as needed (as needed for when seizure starts to occur).   . PediaSure (PEDIASURE) LIQD Take 237 mLs by mouth 2 (two) times daily.  . Probiotic Product ([place]) Take by mouth daily.   . sodium chloride (OCEAN) 0.65 % SOLN nasal spray Place 1 spray into both nostrils as needed for congestion.     Allergies:   Propofol and Proton pump inhibitors   Social History   Socioeconomic History  . Marital status: Divorced    Spouse name: Not on file  .  Number of children: 4  . Years of education: Not on file  . Highest education level: Not on file  Occupational History  . Not on file  Tobacco Use  . Smoking status: Never Smoker  . Smokeless tobacco: Never Used  Vaping Use  . Vaping Use: Never used  Substance and Sexual Activity  . Alcohol use: Not Currently    Comment: minimal  . Drug use: No  . Sexual activity: Not on file  Other Topics Concern  . Not on file  Social History Narrative  . Not on file   Social Determinants of Health   Financial Resource Strain:   . Difficulty of Paying Living Expenses: Not on file  Food Insecurity:   . Worried About Charity fundraiser in the Last Year: Not on file  . Ran Out of Food in  the Last Year: Not on file  Transportation Needs:   . Lack of Transportation (Medical): Not on file  . Lack of Transportation (Non-Medical): Not on file  Physical Activity:   . Days of Exercise per Week: Not on file  . Minutes of Exercise per Session: Not on file  Stress:   . Feeling of Stress : Not on file  Social Connections:   . Frequency of Communication with Friends and Family: Not on file  . Frequency of Social Gatherings with Friends and Family: Not on file  . Attends Religious Services: Not on file  . Active Member of Clubs or Organizations: Not on file  . Attends Archivist Meetings: Not on file  . Marital Status: Not on file     Family History: The patient's family history includes Diabetes in her mother; Pancreatic cancer in her mother. There is no history of Colon cancer, Esophageal cancer, Rectal cancer, or Stomach cancer.  ROS:   Review of Systems  Constitution: Negative for decreased appetite, fever and weight gain.  HENT: Negative for congestion, ear discharge, hoarse voice and sore throat.   Eyes: Negative for discharge, redness, vision loss in right eye and visual halos.  Cardiovascular: Negative for chest pain, dyspnea on exertion, leg swelling, orthopnea and palpitations.  Respiratory: Negative for cough, hemoptysis, shortness of breath and snoring.   Endocrine: Negative for heat intolerance and polyphagia.  Hematologic/Lymphatic: Negative for bleeding problem. Does not bruise/bleed easily.  Skin: Negative for flushing, nail changes, rash and suspicious lesions.  Musculoskeletal: Negative for arthritis, joint pain, muscle cramps, myalgias, neck pain and stiffness.  Gastrointestinal: Negative for abdominal pain, bowel incontinence, diarrhea and excessive appetite.  Genitourinary: Negative for decreased libido, genital sores and incomplete emptying.  Neurological: Negative for brief paralysis, focal weakness, headaches and loss of balance.    Psychiatric/Behavioral: Negative for altered mental status, depression and suicidal ideas.  Allergic/Immunologic: Negative for HIV exposure and persistent infections.    EKGs/Labs/Other Studies Reviewed:    The following studies were reviewed today:   EKG:  The ekg ordered today demonstrates sinus tachycardia, heart rate 127 bpm right atrial enlargement. Echocardiogram at [facility] showed normal EF.  60 to 65%.  Right ventricle was normal in size and function.  Left atrium was normal size.  Left atrium is normal size.  There was mild aortic valve sclerosis.  Normal-appearing mitral valve.  Tricuspid regurgitation was trace.  Pulmonic valve was not visualized.  The aortic root ascending aorta were all normal.  No pericardial fusion noted.   Recent Labs: No results found for requested labs within last 8760 hours.  Recent Lipid Panel  No results found for: CHOL, TRIG, HDL, CHOLHDL, VLDL, LDLCALC, LDLDIRECT  Physical Exam:    VS:  BP 126/86   Pulse (!) 127   Ht 4' 8"  (1.422 m)   Wt 72 lb 9.6 oz (32.9 kg)   SpO2 97%   BMI 16.28 kg/m     Wt Readings from Last 3 Encounters:  04/16/20 72 lb 9.6 oz (32.9 kg)  12/27/19 68 lb (30.8 kg)  05/29/19 70 lb (31.8 kg)     GEN: Well nourished, well developed in no acute distress HEENT: Normal NECK: No JVD; No carotid bruits LYMPHATICS: No lymphadenopathy CARDIAC: S1S2 noted,RRR, no murmurs, rubs, gallops RESPIRATORY:  Clear to auscultation without rales, wheezing or rhonchi  ABDOMEN: Soft, non-tender, non-distended, +bowel sounds, no guarding. EXTREMITIES: No edema, No cyanosis, no clubbing MUSCULOSKELETAL:  No deformity  SKIN: Warm and dry NEUROLOGIC:  Alert and oriented x 3, non-focal PSYCHIATRIC:  Normal affect, good insight  ASSESSMENT:    1. Precordial pain   2. Rapid heartbeat   3. Palpitations   4. Shortness of breath   5. Mixed hyperlipidemia    PLAN:     Her chest pain/chest discomfort now with shortness of  breath is concerning we discussed in the office the patient absolutely would prefer not to have a nuclear stress test.  She does need testing with imaging given her risk factor.  We discussed in detail about a coronary CTA.  I explained to the patient why is it important that she get testing with imaging.  She has agreed for undergoing a coronary CTA.  She had questions concerning the metoprolol.  I explained to the patient what is important that we need to have her take the metoprolol to decrease her heart rate to give [place] better projection for getting this images and also a good study.  She is in agreement with this. She is tachycardic today and she tells me she is rather tachycardic every day I would love to try the patient on low-dose propranolol to see if this is going to help but she tells me that she does not want to take any medications.  In addition her shortness of breath she has been diagnosed with asthma many years ago I recommended that she try some inhalers and see her PCP or pulmonologist to see if this will help with her shortness of breath and she has declined and says she is not taking any medication for this.  I reviewed her prior lipid profile results with her which was done in April 2019 showing an LDL of 150 and her recent LDL of 127.  We went over all of the lipid profile variables.  All of her questions has been answered. She plans to see her gastroenterologist as she tells me she does have some difficulty with her stomach when she swallow and I defer those answers to her gastroenterologist.  The patient is in agreement with the above plan. The patient left the office in stable condition.  The patient will follow up in 3 months or sooner if needed.   Medication Adjustments/Labs and Tests Ordered: Current medicines are reviewed at length with the patient today.  Concerns regarding medicines are outlined above.  Orders Placed This Encounter  Procedures  . CT CORONARY MORPH W/CTA COR  W/SCORE W/CA W/CM &/OR WO/CM  . CT CORONARY FRACTIONAL FLOW RESERVE DATA PREP  . CT CORONARY FRACTIONAL FLOW RESERVE FLUID ANALYSIS  . EKG 12-Lead   Meds ordered this encounter  Medications  .  metoprolol tartrate (LOPRESSOR) 100 MG tablet    Sig: Take 2 hours prior to ct scan    Dispense:  1 tablet    Refill:  0    Patient Instructions   Testing/Procedures:  Your cardiac CT will be scheduled at one of the below locations:   [facility] [address] [place], [place] [address] [phone]    If scheduled at [facility], please arrive at the [facility] main entrance of [facility] 30 minutes prior to test start time. Proceed to the [facility] Radiology Department (first floor) to check-in and test prep.    Please follow these instructions carefully (unless otherwise directed):    On the Night Before the Test: . Be sure to Drink plenty of water. . Do not consume any caffeinated/decaffeinated beverages or chocolate 12 hours prior to your test. . Do not take any antihistamines 12 hours prior to your test.   On the Day of the Test: . Drink plenty of water. Do not drink any water within one hour of the test. . Do not eat any food 4 hours prior to the test. . You may take your regular medications prior to the test.  . Take metoprolol (Lopressor) 100 mg two hours prior to test. . HOLD Furosemide/Hydrochlorothiazide morning of the test. . FEMALES- please wear underwire-free bra if available   After the Test: . Drink plenty of water. . After receiving IV contrast, you may experience a mild flushed feeling. This is normal. . On occasion, you may experience a mild rash up to 24 hours after the test. This is not dangerous. If this occurs, you can take Benadryl 25 mg and increase your fluid intake. . If you experience trouble breathing, this can be serious. If it is severe call 911 IMMEDIATELY. If it is mild, please call our office. . If you  take any of these medications: Glipizide/Metformin, Avandament, Glucavance, please do not take 48 hours after completing test unless otherwise instructed.   Once we have confirmed authorization from your insurance company, we will call you to set up a date and time for your test. Based on how quickly your insurance processes prior authorizations requests, please allow up to 4 weeks to be contacted for scheduling your Cardiac CT appointment. Be advised that routine Cardiac CT appointments could be scheduled as many as 8 weeks after your provider has ordered it.  For non-scheduling related questions, please contact the cardiac imaging nurse navigator should you have any questions/concerns: Marchia Bond, Cardiac Imaging Nurse Navigator Burley Saver, Interim Cardiac Imaging Nurse [place] and Vascular Services Direct Office Dial: [phone]   For scheduling needs, including cancellations and rescheduling, please call [place], [phone] (temporary number).      Follow-Up: At [facility], you and your health needs are our priority.  As part of our continuing mission to provide you with exceptional heart care, we have created designated Provider Care Teams.  These Care Teams include your primary Cardiologist (physician) and Advanced Practice Providers (APPs -  Physician Assistants and Nurse Practitioners) who all work together to provide you with the care you need, when you need it.  We recommend signing up for the patient portal called "MyChart".  Sign up information is provided on this After Visit Summary.  MyChart is used to connect with patients for Virtual Visits (Telemedicine).  Patients are able to view lab/test results, encounter notes, upcoming appointments, etc.  Non-urgent messages can be sent to your  provider as well.   To learn more about what you can do with MyChart, go to [email or web address].    Your next appointment:   3 month(s)  The format for your next  appointment:   In Person  Provider:   Berniece Salines, DO       Adopting a Healthy Lifestyle.  Know what a healthy weight is for you (roughly BMI <25) and aim to maintain this   Aim for 7+ servings of fruits and vegetables daily   65-80+ fluid ounces of water or unsweet tea for healthy kidneys   Limit to max 1 drink of alcohol per day; avoid smoking/tobacco   Limit animal fats in diet for cholesterol and heart health - choose grass fed whenever available   Avoid highly processed foods, and foods high in saturated/trans fats   Aim for low stress - take time to unwind and care for your mental health   Aim for 150 min of moderate intensity exercise weekly for heart health, and weights twice weekly for bone health   Aim for 7-9 hours of sleep daily   When it comes to diets, agreement about the perfect plan isnt easy to find, even among the experts. Experts at the [employer] developed an idea known as the Healthy Eating Plate. Just imagine a plate divided into logical, healthy portions.   The emphasis is on diet quality:   Load up on vegetables and fruits - one-half of your plate: Aim for color and variety, and remember that potatoes dont count.   Go for whole grains - one-quarter of your plate: Whole wheat, barley, wheat berries, quinoa, oats, brown rice, and foods made with them. If you want pasta, go with whole wheat pasta.   Protein power - one-quarter of your plate: Fish, chicken, beans, and nuts are all healthy, versatile protein sources. Limit red meat.   The diet, however, does go beyond the plate, offering a few other suggestions.   Use healthy plant oils, such as olive, canola, soy, corn, sunflower and peanut. Check the labels, and avoid partially hydrogenated oil, which have unhealthy trans fats.   If youre thirsty, drink water. Coffee and tea are good in moderation, but skip sugary drinks and limit milk and dairy products to one or two daily  servings.   The type of carbohydrate in the diet is more important than the amount. Some sources of carbohydrates, such as vegetables, fruits, whole grains, and beans-are healthier than others.   Finally, stay active  Signed, Berniece Salines, DO  04/16/2020 12:05 PM    [place]

## 2020-04-16 NOTE — Patient Instructions (Signed)
Testing/Procedures:  Your cardiac CT will be scheduled at one of the below locations:   [facility] [address] [place], [place] [address] [phone]    If scheduled at [facility], please arrive at the [facility] main entrance of [facility] 30 minutes prior to test start time. Proceed to the [facility] Radiology Department (first floor) to check-in and test prep.    Please follow these instructions carefully (unless otherwise directed):    On the Night Before the Test: . Be sure to Drink plenty of water. . Do not consume any caffeinated/decaffeinated beverages or chocolate 12 hours prior to your test. . Do not take any antihistamines 12 hours prior to your test.   On the Day of the Test: . Drink plenty of water. Do not drink any water within one hour of the test. . Do not eat any food 4 hours prior to the test. . You may take your regular medications prior to the test.  . Take metoprolol (Lopressor) 100 mg two hours prior to test. . HOLD Furosemide/Hydrochlorothiazide morning of the test. . FEMALES- please wear underwire-free bra if available   After the Test: . Drink plenty of water. . After receiving IV contrast, you may experience a mild flushed feeling. This is normal. . On occasion, you may experience a mild rash up to 24 hours after the test. This is not dangerous. If this occurs, you can take Benadryl 25 mg and increase your fluid intake. . If you experience trouble breathing, this can be serious. If it is severe call 911 IMMEDIATELY. If it is mild, please call our office. . If you take any of these medications: Glipizide/Metformin, Avandament, Glucavance, please do not take 48 hours after completing test unless otherwise instructed.   Once we have confirmed authorization from your insurance company, we will call you to set up a date and time for your test. Based on how quickly your insurance processes prior authorizations  requests, please allow up to 4 weeks to be contacted for scheduling your Cardiac CT appointment. Be advised that routine Cardiac CT appointments could be scheduled as many as 8 weeks after your provider has ordered it.  For non-scheduling related questions, please contact the cardiac imaging nurse navigator should you have any questions/concerns: Marchia Bond, Cardiac Imaging Nurse Navigator Burley Saver, Interim Cardiac Imaging Nurse [place] and Vascular Services Direct Office Dial: [phone]   For scheduling needs, including cancellations and rescheduling, please call [place], [phone] (temporary number).      Follow-Up: At [facility], you and your health needs are our priority.  As part of our continuing mission to provide you with exceptional heart care, we have created designated Provider Care Teams.  These Care Teams include your primary Cardiologist (physician) and Advanced Practice Providers (APPs -  Physician Assistants and Nurse Practitioners) who all work together to provide you with the care you need, when you need it.  We recommend signing up for the patient portal called "MyChart".  Sign up information is provided on this After Visit Summary.  MyChart is used to connect with patients for Virtual Visits (Telemedicine).  Patients are able to view lab/test results, encounter notes, upcoming appointments, etc.  Non-urgent messages can be sent to your provider as well.   To learn more about what you can do with MyChart, go to [email or web address].    Your next appointment:   3 month(s)  The format for your next appointment:  In Person  Provider:   Berniece Salines, DO

## 2020-04-21 ENCOUNTER — Other Ambulatory Visit: Payer: Self-pay

## 2020-04-21 ENCOUNTER — Telehealth (INDEPENDENT_AMBULATORY_CARE_PROVIDER_SITE_OTHER): Payer: Medicare Other | Admitting: Gastroenterology

## 2020-04-21 VITALS — Ht <= 58 in | Wt 71.0 lb

## 2020-04-21 DIAGNOSIS — M797 Fibromyalgia: Secondary | ICD-10-CM | POA: Diagnosis not present

## 2020-04-21 DIAGNOSIS — R634 Abnormal weight loss: Secondary | ICD-10-CM | POA: Diagnosis not present

## 2020-04-21 DIAGNOSIS — R102 Pelvic and perineal pain: Secondary | ICD-10-CM | POA: Diagnosis not present

## 2020-04-21 DIAGNOSIS — G8929 Other chronic pain: Secondary | ICD-10-CM | POA: Diagnosis not present

## 2020-04-21 DIAGNOSIS — R1013 Epigastric pain: Secondary | ICD-10-CM

## 2020-04-21 DIAGNOSIS — F410 Panic disorder [episodic paroxysmal anxiety] without agoraphobia: Secondary | ICD-10-CM | POA: Diagnosis not present

## 2020-04-21 DIAGNOSIS — R1084 Generalized abdominal pain: Secondary | ICD-10-CM

## 2020-04-21 DIAGNOSIS — F449 Dissociative and conversion disorder, unspecified: Secondary | ICD-10-CM | POA: Diagnosis not present

## 2020-04-21 DIAGNOSIS — Z90710 Acquired absence of both cervix and uterus: Secondary | ICD-10-CM

## 2020-04-21 DIAGNOSIS — K219 Gastro-esophageal reflux disease without esophagitis: Secondary | ICD-10-CM

## 2020-04-21 DIAGNOSIS — K589 Irritable bowel syndrome without diarrhea: Secondary | ICD-10-CM

## 2020-04-21 DIAGNOSIS — Z681 Body mass index (BMI) 19 or less, adult: Secondary | ICD-10-CM

## 2020-04-21 DIAGNOSIS — R1032 Left lower quadrant pain: Secondary | ICD-10-CM

## 2020-04-21 NOTE — Patient Instructions (Signed)
If you are age 57 or older, your body mass index should be between 23-30. Your Body mass index is 15.64 kg/m. If this is out of the aforementioned range listed, please consider follow up with your Primary Care Provider.  If you are age 76 or younger, your body mass index should be between 19-25. Your Body mass index is 15.64 kg/m. If this is out of the aformentioned range listed, please consider follow up with your Primary Care Provider.   Please call Dr. Leland Her nurse Claiborne Billings, RN)  in 2 weeks at [phone]  to let her now how you are doing.   Thank you,  Dr. Jackquline Denmark

## 2020-04-21 NOTE — Progress Notes (Addendum)
Chief Complaint: FU  Referring Provider:  Dr Delena Bali      ASSESSMENT AND PLAN;   #1. Chronic abdo/pelvic pain (since hysterectomy 04/2016). Dx with IC by urology, fibromyalgia/anxiety/depression/panic attacks/conversion disorder/pseudoseizures. Multiple ED visits. Multiple neg CT scans A/P- 03/28/2019, 03/25/2019, 02/14/2019, 12/2016, 07/2017, 12/2018.   #2. Wt loss (resolved) with neg multiple CTs 07/27/2017, 12/2018, 02/14/2019, 03/28/2019, 03/25/2019. Nl CBC, CMP, TSH. Neg Virtual colonoscopy 11/22/2017. I believe situational anxiety (after hystrectomy per pt) playing a significant role.  #3. IBS with alt diarrhea/constipation. Neg stool studies, cologuard test, virtual colon 11/2017. Failed bentyl, amitriptyline.  #4. GERD with neg Ba Swallow 04/2018.  Neg EGD 04/24/2019 except for gastritis.  Failed carafate, GI coctail, pepcid, protonix, omeprazole, nexium (caused burning) ("can't do PPIs"). Neg recent EGD by Dr. Lyda Jester March 07, 2020  #5. Epi Pain/LUQ pain d/t fibromyalgia- neg EGD 04/24/2019, CT APs as above, [place] 12/2018, 01/2019, [place] 2019. Barium swallow/UGI with SB series on 05/15/2018([facility]) mild-mod GERD without any stricture.  Nl stomach and SB.  #6. SIGNIFICANT ANXIETY/DEPRESSION/PANIC DISORDER/CONVERSION DISORDER/PTSD    Plan: - EGD report and Bx report from [facility] (recent EGD Oct 2021) - Recent discharge summary. - For dysphagia, she wants to give it some time. If not better by mid-Dec, then UGI series with Ba Tab as well - Eating better. Small but more frequent meals. Chew food better and eat slowly - Continue mylenta prn. - Continue Pediasure 2/day. - Proceed with cardiology WU. - FU [name] (psychology) in [place] as she has been doing - Can stop protonix. - Continue probiotics. - FU in 6 months.  Addendum: Records from [facility] obtained and reviewed. EGD 03/07/2020 by Dr. Lyda Jester: Normal exam.  Neg CLO and neg SB bx Adm for chest pains with negative  EKG changes, negative troponins. Follow-up with cardiology arranged. HPI:    Particia Francis is a 59 y.o. female   For follow-up visit.  FU hospitalization [facility] with chest pain with cough. CT chest - pulm nodules. Seen by cardiology. WU in progress.  Has been having occ dysphagia. S/P EGD by Dr M Oct 2021 at [facility] while inpt. Has problems waking up. Thereafter, having dysphagia (with solids and liquids) and odynophagia and abdo pain d/t spasms. Convinced that most of problems are related to recent EGD.     From previous records  multiple problems  -Chronic abd pain with associated nausea but no vomiting, feeling dizzy, weakness, occasional shortness of breath, fatigue, headaches, palpitations, back pain, belching, unexplained weight loss, "a lot of gas"  -Can only eat chicken, protein shake, 1 pediasure.  Tolerating small meals.   -Would have multiple normal bowel movements 3-4 times in the morning then would rarely get constipated with pellet-like stools.  It does change its color.  Protein powder has helped with constipation.  - Has appt with cardiology appt for palpitations.   S/p EGD 04/24/2019 showing mild gastritis.  Biopsies were negative for H. Pylori.  Small bowel biopsies were neg for celiac disease.  She took Protonix for 10 days and then started having " severe burning sensation" as she has with all the rest of the PPIs.  She has stopped taking Protonix.  She has been eating somewhat better.  Small but more frequently.  Has not lost anymore weight.  Tolerating PediaSure well.  She is under care of [name] -psychologist in [place].  She really likes her.  Situational anxiety is getting under control.  All her symptoms started after she had a  hysterectomy 04/2016.  She has been seen by GYN at [facility].  I have reviewed the notes in care everywhere.  It has been recommended to start pelvic physical therapy.  Does not want to try any medications like Carafate.  Wt  Readings from Last 3 Encounters:  04/21/20 71 lb (32.2 kg)  04/16/20 72 lb 9.6 oz (32.9 kg)  12/27/19 68 lb (30.8 kg)    Was concerned about mold allergies.       Past Medical History:  Diagnosis Date  . Anxiety   . Colitis   . IBS (irritable bowel syndrome)   . Varicose veins of bilateral lower extremities with pain          Past Surgical History:  Procedure Laterality Date  . ABDOMINAL HYSTERECTOMY  04/2016  . CESAREAN SECTION     x 4  . COLONOSCOPY  1995   in [place]         Family History  Problem Relation Age of Onset  . Pancreatic cancer Mother     Social History        Tobacco Use  . Smoking status: Never Smoker  . Smokeless tobacco: Never Used  Substance Use Topics  . Alcohol use: Yes    Comment: minimal  . Drug use: No          Current Outpatient Medications  Medication Sig Dispense Refill  . ALPRAZolam (XANAX) 0.5 MG tablet Take 0.5 mg by mouth 3 (three) times daily.     . Probiotic Product (PROBIOTIC DAILY PO) Take by mouth daily.     No current facility-administered medications for this visit.     No Known Allergies  Review of Systems:  Psychiatric/Behavioral:  Has anxiety or depression     Physical Exam:    Vitals:   05/07/19 0809  Weight: 70 lb (31.8 kg)  Height: 4\' 8"  (1.422 m)   Filed Weights   05/07/19 0809  Weight: 70 lb (31.8 kg)   Not examined since was a televisit due to transportation problems   I connected with  Foster Simpson on 04/21/20 by a video enabled telemedicine application (had to switch to phone since video would not connect) and verified that I am speaking with the correct person using two identifiers.   I discussed the limitations of evaluation and management by telemedicine. The patient expressed understanding and agreed to proceed.  Location of the patient: Home Occasion of the physician: Office       Carmell Austria, MD  Cc: Dr Delena Bali

## 2020-04-23 DIAGNOSIS — F411 Generalized anxiety disorder: Secondary | ICD-10-CM | POA: Diagnosis not present

## 2020-05-01 ENCOUNTER — Telehealth: Payer: Self-pay | Admitting: Gastroenterology

## 2020-05-01 DIAGNOSIS — H43813 Vitreous degeneration, bilateral: Secondary | ICD-10-CM | POA: Diagnosis not present

## 2020-05-01 DIAGNOSIS — H5213 Myopia, bilateral: Secondary | ICD-10-CM | POA: Diagnosis not present

## 2020-05-01 DIAGNOSIS — H52223 Regular astigmatism, bilateral: Secondary | ICD-10-CM | POA: Diagnosis not present

## 2020-05-01 NOTE — Telephone Encounter (Signed)
Will schedule patient for a barium swallow with tab at [facility]

## 2020-05-01 NOTE — Telephone Encounter (Signed)
Inbound call from patient stating she is still having stomach cramping and trouble swallowing food and drinks since last visit and would like a call back for advise on what to do.

## 2020-05-02 NOTE — Telephone Encounter (Signed)
Patient is scheduled for a barium swallow with tablet for continued dysphagia per Dr Steve Rattler last office note.

## 2020-05-06 DIAGNOSIS — R1310 Dysphagia, unspecified: Secondary | ICD-10-CM | POA: Diagnosis not present

## 2020-05-07 DIAGNOSIS — F411 Generalized anxiety disorder: Secondary | ICD-10-CM | POA: Diagnosis not present

## 2020-05-09 ENCOUNTER — Telehealth (HOSPITAL_COMMUNITY): Payer: Self-pay

## 2020-05-09 ENCOUNTER — Telehealth: Payer: Self-pay | Admitting: Gastroenterology

## 2020-05-09 NOTE — Telephone Encounter (Signed)
-----   Message from Berniece Salines, DO sent at 05/08/2020 10:41 PM EST ----- Regarding: RE: ct heart update Thanks for letting me know. If you don't mind I will appreciate if you could please also document this under telephone call.   ----- Message ----- From: Melony Overly Sent: 05/08/2020   3:19 PM EST To: Berniece Salines, DO Subject: ct heart update                                I called Donna Francis she refused to schedule her ct scan. She said she is having stomach issues and will like to get that under control before she do anything else.   Thanks, [place]

## 2020-05-09 NOTE — Telephone Encounter (Signed)
Pt is requesting a call back from a nurse to discuss her barium swallow results and to speak about the continuous diarrhea and stomach cramps she has been experiencing.

## 2020-05-09 NOTE — Telephone Encounter (Signed)
The pt has been advised that Claiborne Billings will reach out to her on Monday with appt information.

## 2020-05-12 NOTE — Telephone Encounter (Signed)
Spoke to patient who continues to have ongoing symptoms of abdominal pain diarrhea swallowing issues. She has has several labs, procedures , imaging, and medication management all negative on a GI standpoint. Patient is very agitated with all the testing she has had done with no significant findings as to why she still has symptoms. She is requesting a call from Dr Lyndel Safe. Message routed.

## 2020-05-27 NOTE — Telephone Encounter (Signed)
I have called and spoke to patient. Patient is scheduled at [facility] on 08/22/20 at 12:30pm. I have sent referral and this office will send out new patient paper work for patient.

## 2020-05-27 NOTE — Telephone Encounter (Signed)
I have called patient and left a message for her to return my call.   

## 2020-05-27 NOTE — Telephone Encounter (Signed)
Her extensive work-up has been negative  lets get another GI opinion from [facility]  RG

## 2020-05-28 DIAGNOSIS — E860 Dehydration: Secondary | ICD-10-CM | POA: Diagnosis not present

## 2020-05-28 DIAGNOSIS — R0989 Other specified symptoms and signs involving the circulatory and respiratory systems: Secondary | ICD-10-CM | POA: Diagnosis not present

## 2020-05-28 DIAGNOSIS — R110 Nausea: Secondary | ICD-10-CM | POA: Diagnosis not present

## 2020-05-28 DIAGNOSIS — R002 Palpitations: Secondary | ICD-10-CM | POA: Diagnosis not present

## 2020-05-28 DIAGNOSIS — R1084 Generalized abdominal pain: Secondary | ICD-10-CM | POA: Diagnosis not present

## 2020-05-28 DIAGNOSIS — R059 Cough, unspecified: Secondary | ICD-10-CM | POA: Diagnosis not present

## 2020-05-28 DIAGNOSIS — G4489 Other headache syndrome: Secondary | ICD-10-CM | POA: Diagnosis not present

## 2020-05-29 NOTE — Telephone Encounter (Signed)
Thanks for taking care of her RG 

## 2020-05-30 ENCOUNTER — Telehealth: Payer: Self-pay | Admitting: Gastroenterology

## 2020-05-30 DIAGNOSIS — F411 Generalized anxiety disorder: Secondary | ICD-10-CM | POA: Diagnosis not present

## 2020-05-30 NOTE — Telephone Encounter (Signed)
Dr Lyndel Safe please advise on this patient. She has been referred to the [facility] group,but no appointment at this time.

## 2020-05-30 NOTE — Telephone Encounter (Signed)
Pt is requesting a call back from a nurse to ask if her continuous nausea symptoms.

## 2020-06-02 ENCOUNTER — Other Ambulatory Visit: Payer: Self-pay | Admitting: Gastroenterology

## 2020-06-02 MED ORDER — ONDANSETRON 4 MG PO TBDP: 4.0000 mg | ORAL_TABLET | Freq: Four times a day (QID) | ORAL | 1 refills | Status: DC | PRN

## 2020-06-02 NOTE — Telephone Encounter (Signed)
Sent referral to [phone] per jennifer at [facility]. Michela Pitcher they will call patient to schedule an office visit. I sent last 2 visit with EGD/Virtual Colon and last imaging from 03/2019 and Lab work from 03/30/2019 to 04/09/2019 with demographics and card. All 43 pages.  Phone number is [phone]

## 2020-06-02 NOTE — Telephone Encounter (Signed)
Brook will you look into getting this referral at [facility] for abdominal pain sooner

## 2020-06-02 NOTE — Telephone Encounter (Signed)
Noted! Thank you

## 2020-06-02 NOTE — Telephone Encounter (Signed)
Zofran 4 mg ODT Q 6hrs prn for nausea. #20, 1 refill Can we see if we can get her into [facility] for another opinion faster?  And if she is willing to go to [facility]? RG

## 2020-06-03 DIAGNOSIS — Z20822 Contact with and (suspected) exposure to covid-19: Secondary | ICD-10-CM | POA: Diagnosis not present

## 2020-06-10 DIAGNOSIS — K5900 Constipation, unspecified: Secondary | ICD-10-CM | POA: Diagnosis not present

## 2020-06-10 DIAGNOSIS — R52 Pain, unspecified: Secondary | ICD-10-CM | POA: Diagnosis not present

## 2020-06-10 DIAGNOSIS — K5641 Fecal impaction: Secondary | ICD-10-CM | POA: Diagnosis not present

## 2020-06-11 ENCOUNTER — Telehealth: Payer: Self-pay | Admitting: Gastroenterology

## 2020-06-11 NOTE — Telephone Encounter (Signed)
Spoke to patient who seen at [facility] for constipation. A follow up appointment has been scheduled for 06/13/20. Patient knows to go to the [facility] office.

## 2020-06-11 NOTE — Telephone Encounter (Signed)
Pt was at the [facility] hospital last night because of severe constipation. Pt states that she was impacted. She was told to follow up with Dr. Lyndel Safe. Pls call her.

## 2020-06-11 NOTE — Telephone Encounter (Signed)
Called patient said she when goes to to the bathroom said she has air and was had to go but when she does it is a small BM that is real loose and will have rectal pain and back pain and was told to use the suppositories. Said she does have IBS usually with diarrhea and not constipation   Not much appetite and nausea has been down and said she had a virus (not corona).   Told patient to use the suppositories and to keep the follow up appt for this Friday

## 2020-06-12 NOTE — Telephone Encounter (Signed)
Claiborne Billings can you tell patient what is best for her to do since she still have pain

## 2020-06-12 NOTE — Telephone Encounter (Signed)
Patient calling states she has tried the suppositories and she is still in a lot of back pain and is seeking additional help

## 2020-06-12 NOTE — Telephone Encounter (Addendum)
Patient said that she took the suppositories at 5 pm and took some tylenol this morning   Per kelly patient can take pain medication up to 1600mg  but can use a heating pad to help with the pain. Patient can only do one suppositories a day. Patient should/need to bring someone with her to tomorrow's appointment. Patient understands

## 2020-06-13 ENCOUNTER — Ambulatory Visit (INDEPENDENT_AMBULATORY_CARE_PROVIDER_SITE_OTHER): Payer: Medicare Other | Admitting: Gastroenterology

## 2020-06-13 ENCOUNTER — Ambulatory Visit (HOSPITAL_BASED_OUTPATIENT_CLINIC_OR_DEPARTMENT_OTHER)
Admission: RE | Admit: 2020-06-13 | Discharge: 2020-06-13 | Disposition: A | Discharge status: Home / Self Care | Payer: Medicare Other | Source: Ambulatory Visit | Attending: Gastroenterology | Admitting: Gastroenterology

## 2020-06-13 ENCOUNTER — Other Ambulatory Visit: Payer: Self-pay

## 2020-06-13 ENCOUNTER — Encounter: Payer: Self-pay | Admitting: Gastroenterology

## 2020-06-13 VITALS — BP 142/86 | HR 129 | Ht <= 58 in | Wt 71.1 lb

## 2020-06-13 DIAGNOSIS — Z8719 Personal history of other diseases of the digestive system: Secondary | ICD-10-CM | POA: Diagnosis not present

## 2020-06-13 DIAGNOSIS — K5900 Constipation, unspecified: Secondary | ICD-10-CM

## 2020-06-13 DIAGNOSIS — F411 Generalized anxiety disorder: Secondary | ICD-10-CM | POA: Diagnosis not present

## 2020-06-13 NOTE — Progress Notes (Signed)
Chief Complaint: FU  Referring Provider:  Dr Delena Bali      ASSESSMENT AND PLAN;   #1. Fecal impaction s/p manual disimpaction January 2022.  Rectal exam today without any rectal impaction.  Stool soft heme-neg.  #2. Chronic abdo/pelvic pain (since hysterectomy 04/2016). Dx with IC by urology, fibromyalgia/anxiety/depression/panic attacks/conversion disorder/pseudoseizures. Multiple ED visits. Multiple neg CT scans A/P- 03/28/2019, 03/25/2019, 02/14/2019, 12/2016, 07/2017, 12/2018.   #3. Wt loss (resolved) with neg multiple CTs 07/27/2017, 12/2018, 02/14/2019, 03/28/2019, 03/25/2019. Nl CBC, CMP, TSH. Neg Virtual colonoscopy 11/22/2017. I believe situational anxiety (after hystrectomy per pt) playing a significant role.  #4. IBS with alt diarrhea/constipation. Neg stool studies, cologuard test, virtual colon 11/2017. Failed bentyl, amitriptyline.  #4. GERD with neg Ba Swallow 04/2018.  Neg EGD 04/24/2019, 02/2020. Failed carafate, GI coctail, pepcid, protonix, omeprazole, nexium (caused burning) ("can't do PPIs").   #6. SIGNIFICANT ANXIETY/DEPRESSION/PANIC DISORDER/CONVERSION DISORDER/PTSD    Plan: - Small but frequent meals. - KUB 2 view to r/o any high fecal impaction - Colace 1 tab po qd - Continue current management and FU with behavioral therapy.   HPI:    Donna Francis is a 60 y.o. female   For follow-up visit.  C/O "Viral infection" not COVID-19 (was tested) 04/2020, had post viral anorexia and was not eating well which resulted in severe constipation and fecal impaction.  She was seen in ED at [facility] on 06/10/2020 and apparently had fecal disimpaction.  She was given glycerin suppositories and fleets enemas with good results.  X-ray KUB showed mild retained fecal material without any obstructive changes.  Comes to GI clinic for follow-up visit.  She still has some rectal fullness.  Stool is soft.  She is having bowel movements at the frequency 1/day.  No significant abdominal  pain or distention.  No further odynophagia or dysphagia  No further weight loss.  Wt Readings from Last 3 Encounters:  06/13/20 71 lb 2 oz (32.3 kg)  04/21/20 71 lb (32.2 kg)  04/16/20 72 lb 9.6 oz (32.9 kg)   Her cardiology work-up has been neg.  She has done well with the behavioral therapy.       From previous records  multiple problems  -occ dysphagia. S/P EGD by Dr M Oct 2021 at [facility] while inpt. Has problems waking up. Thereafter, had dysphagia (with solids and liquids) and odynophagia and abdo pain d/t spasms. Convinced that most of problems are related to recent EGD  -Chronic abd pain with associated nausea but no vomiting, feeling dizzy, weakness, occasional shortness of breath, fatigue, headaches, palpitations, back pain, belching, unexplained weight loss, "a lot of gas"  -Can only eat chicken, protein shake, 1 pediasure.  Tolerating small meals.   -Would have multiple normal bowel movements 3-4 times in the morning then would rarely get constipated with pellet-like stools.  It does change its color.  Protein powder has helped with constipation.  -S/p EGD 04/24/2019 showing mild gastritis.  Biopsies were negative for H. Pylori.  Small bowel biopsies were neg for celiac disease.  She took Protonix for 10 days and then started having " severe burning sensation" as she has with all the rest of the PPIs.  She has stopped taking Protonix.  She has been eating somewhat better.  Small but more frequently.  Has not lost anymore weight.  Tolerating PediaSure well.  She is under care of [name] -psychologist in [place].  She really likes her.  Situational anxiety is getting under control.  All her  symptoms started after she had a hysterectomy 04/2016.  She has been seen by GYN at [facility].  I have reviewed the notes in care everywhere.  It has been recommended to start pelvic physical therapy.       Past Medical History:  Diagnosis Date  . Anxiety   . Colitis    . IBS (irritable bowel syndrome)   . Varicose veins of bilateral lower extremities with pain          Past Surgical History:  Procedure Laterality Date  . ABDOMINAL HYSTERECTOMY  04/2016  . CESAREAN SECTION     x 4  . COLONOSCOPY  1995   in [place]         Family History  Problem Relation Age of Onset  . Pancreatic cancer Mother     Social History        Tobacco Use  . Smoking status: Never Smoker  . Smokeless tobacco: Never Used  Substance Use Topics  . Alcohol use: Yes    Comment: minimal  . Drug use: No          Current Outpatient Medications  Medication Sig Dispense Refill  . ALPRAZolam (XANAX) 0.5 MG tablet Take 0.5 mg by mouth 3 (three) times daily.     . Probiotic Product (PROBIOTIC DAILY PO) Take by mouth daily.     No current facility-administered medications for this visit.     No Known Allergies  Review of Systems:  Psychiatric/Behavioral:  Has anxiety or depression     Physical Exam:    Vitals:   06/13/20 1133  Weight: 71 lb 2 oz (32.3 kg)  Height: 4\' 8"  (1.422 m)   Blood pressure 146/80, pulse 100/min  Gen: awake, alert, NAD HEENT: anicteric, no pallor CV: RRR, no mrg Pulm: CTA b/l Abd: soft, NT/ND, +BS throughout Rectal exam: In presence of [name] CMA.  Rectal vault more or less empty.  Small amount of soft stool-brown in color.  Heme-negative. Ext: no c/c/e Neuro: nonfocal   Records from [facility] emergency room visit obtained and reviewed.     Carmell Austria, MD  Cc: Dr Delena Bali

## 2020-06-13 NOTE — Patient Instructions (Addendum)
If you are age 58 or older, your body mass index should be between 23-30. Your Body mass index is 15.95 kg/m. If this is out of the aforementioned range listed, please consider follow up with your Primary Care Provider.  If you are age 41 or younger, your body mass index should be between 19-25. Your Body mass index is 15.95 kg/m. If this is out of the aformentioned range listed, please consider follow up with your Primary Care Provider.   An X-ray has been ordered for you to do today here in the [facility] on the first floor. Please complete this prior to leaving.  Take 1 Colace tablet/Capsule daily to help with constipation.  The number to [facility] for your referral in [phone]  Follow up as needed.  Thank you for entrusting me with your care and choosing [facility].  Dr Jackquline Denmark

## 2020-06-14 ENCOUNTER — Telehealth: Payer: Self-pay | Admitting: Physician Assistant

## 2020-06-14 NOTE — Telephone Encounter (Signed)
Patient called the answering service. She has not had a bowel movement and feels some nausea but no vomiting, abdominal discomfort similar to what she has had before.  No fevers, no chills.  At recent office visit with Dr. Lyndel Safe she was advised to start taking Colace.  She has not had a bowel movement since she had large stool after her disimpaction at the [facility] ED on 1/18.  she never went to the store to pick this up and now she is at home and snowed in.  She relies on family, currently her daughter, to pick up meds for her.  I advised her that when she or someone else was able to get to the store to pick up a bottle of mag citrate and drink this slowly.  Patient also has fleets suppositories at home and I advised her to insert 1 of these and repeat the same thing in 4 hours and less she had interval bowel movement.  Advised her to use a heating pad and take hot baths to try to relieve the abdominal pain.  Azucena Freed PA-C

## 2020-06-16 ENCOUNTER — Telehealth: Payer: Self-pay | Admitting: Gastroenterology

## 2020-06-16 NOTE — Telephone Encounter (Signed)
Pls call pt, she stated that she took colace and it gave her very bad stomach cramping and was able to have a bm. However, after that her stomach still feels heavy and the sensation that she is going to have another bm but nothing really happens. Pls call her.

## 2020-06-17 NOTE — Telephone Encounter (Signed)
Pt is calling again checking on the status of the msg, she would like a call back ASAP.

## 2020-06-18 DIAGNOSIS — Z9181 History of falling: Secondary | ICD-10-CM | POA: Diagnosis not present

## 2020-06-18 DIAGNOSIS — Z0000 Encounter for general adult medical examination without abnormal findings: Secondary | ICD-10-CM | POA: Diagnosis not present

## 2020-06-18 DIAGNOSIS — E785 Hyperlipidemia, unspecified: Secondary | ICD-10-CM | POA: Diagnosis not present

## 2020-06-18 DIAGNOSIS — Z1331 Encounter for screening for depression: Secondary | ICD-10-CM | POA: Diagnosis not present

## 2020-06-18 NOTE — Telephone Encounter (Signed)
I have called and spoke to patient and she said that Colace caused her to have cramps. Patient said it did cause her to have a large bowel movement that was "mush". Patient says she feels like she still needs to have a bowel movement but she doesn't feel like she can have a bowel movement. Patient states that shes not going to take anymore Colace and wants to know what else you recommend.

## 2020-06-19 NOTE — Telephone Encounter (Signed)
Colace was supposed to do that.  She can take it twice a week.  That should be more than enough. Certainly do not want her to get impacted again.   RG

## 2020-06-19 NOTE — Telephone Encounter (Signed)
I have called and spoke to patient, patient voiced understanding and agreed with plan.

## 2020-06-20 DIAGNOSIS — Z20822 Contact with and (suspected) exposure to covid-19: Secondary | ICD-10-CM | POA: Diagnosis not present

## 2020-06-20 DIAGNOSIS — Z79899 Other long term (current) drug therapy: Secondary | ICD-10-CM | POA: Diagnosis not present

## 2020-06-20 DIAGNOSIS — Z681 Body mass index (BMI) 19 or less, adult: Secondary | ICD-10-CM | POA: Diagnosis not present

## 2020-06-20 DIAGNOSIS — E871 Hypo-osmolality and hyponatremia: Secondary | ICD-10-CM | POA: Diagnosis not present

## 2020-06-20 DIAGNOSIS — F411 Generalized anxiety disorder: Secondary | ICD-10-CM | POA: Diagnosis not present

## 2020-06-20 DIAGNOSIS — F419 Anxiety disorder, unspecified: Secondary | ICD-10-CM | POA: Diagnosis not present

## 2020-06-20 DIAGNOSIS — E785 Hyperlipidemia, unspecified: Secondary | ICD-10-CM | POA: Diagnosis not present

## 2020-06-20 DIAGNOSIS — R350 Frequency of micturition: Secondary | ICD-10-CM | POA: Diagnosis not present

## 2020-06-20 DIAGNOSIS — K582 Mixed irritable bowel syndrome: Secondary | ICD-10-CM | POA: Diagnosis not present

## 2020-06-25 DIAGNOSIS — F411 Generalized anxiety disorder: Secondary | ICD-10-CM | POA: Diagnosis not present

## 2020-07-02 DIAGNOSIS — F411 Generalized anxiety disorder: Secondary | ICD-10-CM | POA: Diagnosis not present

## 2020-07-03 DIAGNOSIS — M545 Low back pain, unspecified: Secondary | ICD-10-CM | POA: Diagnosis not present

## 2020-07-03 DIAGNOSIS — R350 Frequency of micturition: Secondary | ICD-10-CM | POA: Diagnosis not present

## 2020-07-03 DIAGNOSIS — E538 Deficiency of other specified B group vitamins: Secondary | ICD-10-CM | POA: Diagnosis not present

## 2020-07-03 DIAGNOSIS — E871 Hypo-osmolality and hyponatremia: Secondary | ICD-10-CM | POA: Diagnosis not present

## 2020-07-03 DIAGNOSIS — R531 Weakness: Secondary | ICD-10-CM | POA: Diagnosis not present

## 2020-07-03 DIAGNOSIS — Z681 Body mass index (BMI) 19 or less, adult: Secondary | ICD-10-CM | POA: Diagnosis not present

## 2020-07-03 DIAGNOSIS — E559 Vitamin D deficiency, unspecified: Secondary | ICD-10-CM | POA: Diagnosis not present

## 2020-07-03 DIAGNOSIS — M79606 Pain in leg, unspecified: Secondary | ICD-10-CM | POA: Diagnosis not present

## 2020-07-09 DIAGNOSIS — F411 Generalized anxiety disorder: Secondary | ICD-10-CM | POA: Diagnosis not present

## 2020-07-14 DIAGNOSIS — R3982 Chronic bladder pain: Secondary | ICD-10-CM | POA: Diagnosis not present

## 2020-07-14 DIAGNOSIS — R350 Frequency of micturition: Secondary | ICD-10-CM | POA: Diagnosis not present

## 2020-07-14 DIAGNOSIS — Z79899 Other long term (current) drug therapy: Secondary | ICD-10-CM | POA: Diagnosis not present

## 2020-07-16 DIAGNOSIS — F411 Generalized anxiety disorder: Secondary | ICD-10-CM | POA: Diagnosis not present

## 2020-07-21 ENCOUNTER — Ambulatory Visit: Payer: Medicare Other | Admitting: Cardiology

## 2020-07-22 DIAGNOSIS — R1084 Generalized abdominal pain: Secondary | ICD-10-CM | POA: Diagnosis not present

## 2020-07-22 DIAGNOSIS — Z681 Body mass index (BMI) 19 or less, adult: Secondary | ICD-10-CM | POA: Diagnosis not present

## 2020-07-23 DIAGNOSIS — F411 Generalized anxiety disorder: Secondary | ICD-10-CM | POA: Diagnosis not present

## 2020-07-25 DIAGNOSIS — R109 Unspecified abdominal pain: Secondary | ICD-10-CM | POA: Diagnosis not present

## 2020-07-25 DIAGNOSIS — R1084 Generalized abdominal pain: Secondary | ICD-10-CM | POA: Diagnosis not present

## 2020-08-05 DIAGNOSIS — F411 Generalized anxiety disorder: Secondary | ICD-10-CM | POA: Diagnosis not present

## 2020-08-13 DIAGNOSIS — F411 Generalized anxiety disorder: Secondary | ICD-10-CM | POA: Diagnosis not present

## 2020-08-25 DIAGNOSIS — R1084 Generalized abdominal pain: Secondary | ICD-10-CM | POA: Diagnosis not present

## 2020-08-25 DIAGNOSIS — R1031 Right lower quadrant pain: Secondary | ICD-10-CM | POA: Diagnosis not present

## 2020-08-25 DIAGNOSIS — K5900 Constipation, unspecified: Secondary | ICD-10-CM | POA: Diagnosis not present

## 2020-08-27 ENCOUNTER — Telehealth: Payer: Self-pay | Admitting: Gastroenterology

## 2020-08-27 DIAGNOSIS — F411 Generalized anxiety disorder: Secondary | ICD-10-CM | POA: Diagnosis not present

## 2020-08-27 NOTE — Telephone Encounter (Signed)
Pt states she has been having some abdominal cramping and a change in her bowel habits. Has been to the ER a few times for constipation. Offered pt an appt on 09/01/20@3pm  but pt reports she has panic attacks and cannot come to [employer] in all that traffic. Pt scheduled to see Dr. Lyndel Safe 09/23/20 at 11:20am. Discussed with her she may want to try taking Miralax daily to see if that helps with her constipation. She was told to take it from the ER also.

## 2020-08-27 NOTE — Telephone Encounter (Signed)
Inbound call from patient requesting a call back from a nurse please.  Has been experiencing abdominal cramping and changes in her bowel habits.  Please advise.

## 2020-09-03 DIAGNOSIS — F411 Generalized anxiety disorder: Secondary | ICD-10-CM | POA: Diagnosis not present

## 2020-09-10 DIAGNOSIS — F411 Generalized anxiety disorder: Secondary | ICD-10-CM | POA: Diagnosis not present

## 2020-09-12 DIAGNOSIS — R531 Weakness: Secondary | ICD-10-CM | POA: Diagnosis not present

## 2020-09-12 DIAGNOSIS — F419 Anxiety disorder, unspecified: Secondary | ICD-10-CM | POA: Diagnosis not present

## 2020-09-12 DIAGNOSIS — N8189 Other female genital prolapse: Secondary | ICD-10-CM | POA: Diagnosis not present

## 2020-09-12 DIAGNOSIS — I479 Paroxysmal tachycardia, unspecified: Secondary | ICD-10-CM | POA: Diagnosis not present

## 2020-09-12 DIAGNOSIS — Z681 Body mass index (BMI) 19 or less, adult: Secondary | ICD-10-CM | POA: Diagnosis not present

## 2020-09-12 DIAGNOSIS — R109 Unspecified abdominal pain: Secondary | ICD-10-CM | POA: Diagnosis not present

## 2020-09-12 DIAGNOSIS — K5900 Constipation, unspecified: Secondary | ICD-10-CM | POA: Diagnosis not present

## 2020-09-12 DIAGNOSIS — K589 Irritable bowel syndrome without diarrhea: Secondary | ICD-10-CM | POA: Diagnosis not present

## 2020-09-12 DIAGNOSIS — E611 Iron deficiency: Secondary | ICD-10-CM | POA: Diagnosis not present

## 2020-09-17 DIAGNOSIS — F411 Generalized anxiety disorder: Secondary | ICD-10-CM | POA: Diagnosis not present

## 2020-09-23 ENCOUNTER — Other Ambulatory Visit: Payer: Self-pay

## 2020-09-23 ENCOUNTER — Ambulatory Visit (INDEPENDENT_AMBULATORY_CARE_PROVIDER_SITE_OTHER): Payer: Medicare Other | Admitting: Gastroenterology

## 2020-09-23 ENCOUNTER — Encounter: Payer: Self-pay | Admitting: Gastroenterology

## 2020-09-23 VITALS — HR 133 | Ht <= 58 in | Wt 73.4 lb

## 2020-09-23 DIAGNOSIS — K5909 Other constipation: Secondary | ICD-10-CM

## 2020-09-23 NOTE — Progress Notes (Signed)
Chief Complaint: FU  Referring Provider:  Dr Delena Bali      ASSESSMENT AND PLAN;   #1.   Chronic constipation. H/O fecal impaction s/p manual disimpaction 05/2020.  Subsequent rectal exams neg.  Stool soft heme-neg. Neg stool studies, cologuard test 04/2017, virtual colon 11/2017. Failed bentyl, amitriptyline. Refuses colon.  #2. Chronic abdo/pelvic pain (since hysterectomy 04/2016). Dx with IC by urology, fibromyalgia/anxiety/depression/panic attacks/conversion disorder/pseudoseizures. Multiple ED visits. Multiple neg CT scans A/P- 03/28/2019, 03/25/2019, 02/14/2019, 12/2016, 07/2017, 12/2018.   #3. Wt loss (resolved) with neg multiple CTs 07/27/2017, 12/2018, 02/14/2019, 03/28/2019, 03/25/2019. Nl CBC, CMP, TSH. Neg Virtual colonoscopy 11/22/2017. I believe situational anxiety (after hystrectomy per pt) playing a significant role.  #4. GERD with neg Ba Swallow 04/2018.  Neg EGD 04/24/2019, 02/2020. Failed carafate, GI coctail, pepcid, protonix, omeprazole, nexium (caused burning) ("can't do PPIs").   #5. SIGNIFICANT ANXIETY/DEPRESSION/PANIC DISORDER/CONVERSION DISORDER/PTSD    Plan:  -ARM (anorectal manometry with balloon expulsion) at [facility] for suspicion of pelvic floor dyssynergia -Sitz marker study to r/o colonic inertia -Kegel's exercises -Small but frequent meals. -Continue current management and FU with behavioral therapy.   HPI:    [name] is a 60 y.o. female   For follow-up visit.  Convinced that her abdo problems got worse after EGD October 2021  H/O longstanding constipation-currently having BMs 1/day, somewhat hard, has to strain, stopped miralax and colace d/t diarrhea.  She does feel bloated with generalized abdominal discomfort which gets better with defecation.  Refuses colonoscopy.  Eating better  No further weight loss.  In fact she has gained 2 pounds since the last visit.  Post recent x-ray KUB did not show significant stool.  No impaction.   Wt  Readings from Last 3 Encounters:  09/23/20 73 lb 6 oz (33.3 kg)  06/13/20 71 lb 2 oz (32.3 kg)  04/21/20 71 lb (32.2 kg)   Her cardiology work-up has been neg.  She has done well with the behavioral therapy.     From previous records:  -occ dysphagia. S/P EGD by Dr M Oct 2021 at [facility] while inpt. Has problems waking up. Thereafter, had dysphagia (with solids and liquids) and odynophagia and abdo pain d/t spasms. Convinced that most of problems are related to recent EGD  -Chronic abd pain with associated nausea but no vomiting, feeling dizzy, weakness, occasional shortness of breath, fatigue, headaches, palpitations, back pain, belching, unexplained weight loss, "a lot of gas"  -Can only eat chicken, protein shake, 1 pediasure.  Tolerating small meals.   -S/p EGD 04/24/2019 showing mild gastritis.  Biopsies were negative for H. Pylori.  SB Bx- neg for celiac disease.  Intolerance to multiple PPIs.  -She is under care of [name] -psychologist in [place].  She really likes her.  Situational anxiety is getting under control.  -All her symptoms started after she had a hysterectomy 04/2016.  She has been seen by GYN at [facility].  I have reviewed the notes in care everywhere.  It has been recommended to start pelvic physical therapy.       Past Medical History:  Diagnosis Date  . Anxiety   . Colitis   . IBS (irritable bowel syndrome)   . Varicose veins of bilateral lower extremities with pain          Past Surgical History:  Procedure Laterality Date  . ABDOMINAL HYSTERECTOMY  04/2016  . CESAREAN SECTION     x 4  . COLONOSCOPY  1995   in  [place]         Family History  Problem Relation Age of Onset  . Pancreatic cancer Mother     Social History        Tobacco Use  . Smoking status: Never Smoker  . Smokeless tobacco: Never Used  Substance Use Topics  . Alcohol use: Yes    Comment: minimal  . Drug use: No          Current Outpatient  Medications  Medication Sig Dispense Refill  . ALPRAZolam (XANAX) 0.5 MG tablet Take 0.5 mg by mouth 3 (three) times daily.     . Probiotic Product (PROBIOTIC DAILY PO) Take by mouth daily.     No current facility-administered medications for this visit.     No Known Allergies  Review of Systems:  Psychiatric/Behavioral:  Has anxiety or depression     Physical Exam:    Vitals:   09/23/20 1129  Weight: 73 lb 6 oz (33.3 kg)  Height: 4\' 8"  (1.422 m)   Blood pressure 146/80, pulse 100/min  Gen: awake, alert, NAD HEENT: anicteric, no pallor CV: RRR, no mrg Pulm: CTA b/l Abd: soft, NT/ND, +BS throughout Rectal exam: In presence of [name] CMA.  Rectal vault more or less empty.  Small amount of soft stool-brown in color.  Heme-negative. Ext: no c/c/e Neuro: nonfocal     Carmell Austria, MD  Cc: Dr Delena Bali

## 2020-09-23 NOTE — Patient Instructions (Addendum)
If you are age 58 or older, your body mass index should be between 23-30. Your Body mass index is 16.45 kg/m. If this is out of the aforementioned range listed, please consider follow up with your Primary Care Provider.  If you are age 23 or younger, your body mass index should be between 19-25. Your Body mass index is 16.45 kg/m. If this is out of the aformentioned range listed, please consider follow up with your Primary Care Provider.   Please purchase the following medications over the counter and take as directed: Marland Kitchen Eat small but frequent meals. Continue with current management and Follow up with behavior therapy. Start doing kegel exercises   We will contact [facility] to schedule an anorectal manometry. You will hear from them to schedule.   I have place an order for you to have a KUB xray. You will need to swallow the pill given on day 1 then on Day 5 come back to [place] for the flat abdominal xray.   Due to recent changes in healthcare laws, you may see the results of your imaging and laboratory studies on MyChart before your provider has had a chance to review them.  We understand that in some cases there may be results that are confusing or concerning to you. Not all laboratory results come back in the same time frame and the provider may be waiting for multiple results in order to interpret others.  Please give [place] 48 hours in order for your provider to thoroughly review all the results before contacting the office for clarification of your results.   It was a pleasure to see you today!  Jackquline Denmark, M.D.

## 2020-09-25 ENCOUNTER — Telehealth: Payer: Self-pay | Admitting: Gastroenterology

## 2020-09-25 DIAGNOSIS — K5909 Other constipation: Secondary | ICD-10-CM

## 2020-09-25 NOTE — Telephone Encounter (Signed)
Inbound call from patient making sure xray order was sent to facility where she needs to go have it done.  Please advise.

## 2020-09-26 NOTE — Telephone Encounter (Signed)
Inbound call from patient. I inform her about the KUB faxed to [facility] but patient would still want to talk to you [phone]

## 2020-09-26 NOTE — Telephone Encounter (Signed)
Answered patient questions

## 2020-09-26 NOTE — Telephone Encounter (Signed)
KUB 2 view was faxed and confirmed at [facility]. LVM for patient to call back to be told this so she can walk in to the hospital to have her KUB down when she is suppose to

## 2020-10-01 DIAGNOSIS — F411 Generalized anxiety disorder: Secondary | ICD-10-CM | POA: Diagnosis not present

## 2020-10-02 ENCOUNTER — Telehealth: Payer: Self-pay | Admitting: Gastroenterology

## 2020-10-02 ENCOUNTER — Other Ambulatory Visit: Payer: Self-pay | Admitting: General Surgery

## 2020-10-02 DIAGNOSIS — M6289 Other specified disorders of muscle: Secondary | ICD-10-CM

## 2020-10-02 NOTE — Telephone Encounter (Signed)
She was told she can take it.

## 2020-10-02 NOTE — Progress Notes (Signed)
Sent order to [facility] for an anorectal manometry with balloon expulsion, for suspicion of pelvic floor dyssynergia

## 2020-10-02 NOTE — Telephone Encounter (Signed)
Inbound call from patient called in to see if taking mylanta would impact her taking sitz marker pill.

## 2020-10-06 ENCOUNTER — Encounter: Payer: Self-pay | Admitting: Gastroenterology

## 2020-10-06 DIAGNOSIS — K5909 Other constipation: Secondary | ICD-10-CM | POA: Diagnosis not present

## 2020-10-06 DIAGNOSIS — R195 Other fecal abnormalities: Secondary | ICD-10-CM | POA: Diagnosis not present

## 2020-10-08 DIAGNOSIS — F411 Generalized anxiety disorder: Secondary | ICD-10-CM | POA: Diagnosis not present

## 2020-10-09 ENCOUNTER — Telehealth: Payer: Self-pay | Admitting: Gastroenterology

## 2020-10-09 NOTE — Telephone Encounter (Signed)
Patient called to obtain capsule results

## 2020-10-10 NOTE — Telephone Encounter (Signed)
Patient verbalized understanding to call [place]

## 2020-10-10 NOTE — Telephone Encounter (Signed)
Results from KUB shows no evidence of retained metallic sitz markers. Unremarkable bowel gas pattern. Dr Lyndel Safe said nice continue current medication. Referral for her anorectal manometry was sent on the 12th I was told so she can [place] to request a day to it if she hasn't heard anything from them yet

## 2020-10-15 DIAGNOSIS — F411 Generalized anxiety disorder: Secondary | ICD-10-CM | POA: Diagnosis not present

## 2020-10-22 DIAGNOSIS — F411 Generalized anxiety disorder: Secondary | ICD-10-CM | POA: Diagnosis not present

## 2020-10-27 DIAGNOSIS — R634 Abnormal weight loss: Secondary | ICD-10-CM | POA: Diagnosis not present

## 2020-10-27 DIAGNOSIS — R109 Unspecified abdominal pain: Secondary | ICD-10-CM | POA: Diagnosis not present

## 2020-10-27 DIAGNOSIS — F419 Anxiety disorder, unspecified: Secondary | ICD-10-CM | POA: Diagnosis not present

## 2020-10-29 DIAGNOSIS — F411 Generalized anxiety disorder: Secondary | ICD-10-CM | POA: Diagnosis not present

## 2020-10-31 DIAGNOSIS — Z79899 Other long term (current) drug therapy: Secondary | ICD-10-CM | POA: Diagnosis not present

## 2020-10-31 DIAGNOSIS — R1084 Generalized abdominal pain: Secondary | ICD-10-CM | POA: Diagnosis not present

## 2020-10-31 DIAGNOSIS — Z681 Body mass index (BMI) 19 or less, adult: Secondary | ICD-10-CM | POA: Diagnosis not present

## 2020-10-31 DIAGNOSIS — R531 Weakness: Secondary | ICD-10-CM | POA: Diagnosis not present

## 2020-10-31 DIAGNOSIS — F419 Anxiety disorder, unspecified: Secondary | ICD-10-CM | POA: Diagnosis not present

## 2020-11-04 DIAGNOSIS — Z884 Allergy status to anesthetic agent status: Secondary | ICD-10-CM | POA: Diagnosis not present

## 2020-11-04 DIAGNOSIS — K5900 Constipation, unspecified: Secondary | ICD-10-CM | POA: Diagnosis not present

## 2020-11-04 DIAGNOSIS — M6289 Other specified disorders of muscle: Secondary | ICD-10-CM | POA: Diagnosis not present

## 2020-11-04 DIAGNOSIS — Z79899 Other long term (current) drug therapy: Secondary | ICD-10-CM | POA: Diagnosis not present

## 2020-11-05 DIAGNOSIS — F411 Generalized anxiety disorder: Secondary | ICD-10-CM | POA: Diagnosis not present

## 2020-11-09 DIAGNOSIS — R079 Chest pain, unspecified: Secondary | ICD-10-CM | POA: Diagnosis not present

## 2020-11-09 DIAGNOSIS — J45909 Unspecified asthma, uncomplicated: Secondary | ICD-10-CM | POA: Diagnosis present

## 2020-11-09 DIAGNOSIS — R109 Unspecified abdominal pain: Secondary | ICD-10-CM | POA: Diagnosis not present

## 2020-11-09 DIAGNOSIS — R1084 Generalized abdominal pain: Secondary | ICD-10-CM | POA: Diagnosis not present

## 2020-11-09 DIAGNOSIS — R918 Other nonspecific abnormal finding of lung field: Secondary | ICD-10-CM | POA: Diagnosis not present

## 2020-11-09 DIAGNOSIS — R112 Nausea with vomiting, unspecified: Secondary | ICD-10-CM | POA: Diagnosis not present

## 2020-11-09 DIAGNOSIS — R0602 Shortness of breath: Secondary | ICD-10-CM | POA: Diagnosis not present

## 2020-11-09 DIAGNOSIS — F419 Anxiety disorder, unspecified: Secondary | ICD-10-CM | POA: Diagnosis not present

## 2020-11-09 DIAGNOSIS — K5900 Constipation, unspecified: Secondary | ICD-10-CM | POA: Diagnosis present

## 2020-11-09 DIAGNOSIS — R0789 Other chest pain: Secondary | ICD-10-CM | POA: Diagnosis not present

## 2020-11-09 DIAGNOSIS — Z79899 Other long term (current) drug therapy: Secondary | ICD-10-CM | POA: Diagnosis not present

## 2020-11-09 DIAGNOSIS — M797 Fibromyalgia: Secondary | ICD-10-CM | POA: Diagnosis present

## 2020-11-09 DIAGNOSIS — N3289 Other specified disorders of bladder: Secondary | ICD-10-CM | POA: Diagnosis not present

## 2020-11-09 DIAGNOSIS — K219 Gastro-esophageal reflux disease without esophagitis: Secondary | ICD-10-CM | POA: Diagnosis present

## 2020-11-09 DIAGNOSIS — M069 Rheumatoid arthritis, unspecified: Secondary | ICD-10-CM | POA: Diagnosis present

## 2020-11-10 DIAGNOSIS — F419 Anxiety disorder, unspecified: Secondary | ICD-10-CM | POA: Diagnosis not present

## 2020-11-10 DIAGNOSIS — R109 Unspecified abdominal pain: Secondary | ICD-10-CM | POA: Diagnosis not present

## 2020-11-10 DIAGNOSIS — R0789 Other chest pain: Secondary | ICD-10-CM | POA: Diagnosis not present

## 2020-11-12 ENCOUNTER — Telehealth: Payer: Self-pay | Admitting: Gastroenterology

## 2020-11-12 DIAGNOSIS — F411 Generalized anxiety disorder: Secondary | ICD-10-CM | POA: Diagnosis not present

## 2020-11-12 NOTE — Telephone Encounter (Signed)
See note below and advise. 

## 2020-11-13 DIAGNOSIS — M797 Fibromyalgia: Secondary | ICD-10-CM | POA: Diagnosis not present

## 2020-11-13 DIAGNOSIS — M940 Chondrocostal junction syndrome [Tietze]: Secondary | ICD-10-CM | POA: Diagnosis not present

## 2020-11-13 DIAGNOSIS — Z79899 Other long term (current) drug therapy: Secondary | ICD-10-CM | POA: Diagnosis not present

## 2020-11-13 DIAGNOSIS — K582 Mixed irritable bowel syndrome: Secondary | ICD-10-CM | POA: Diagnosis not present

## 2020-11-13 DIAGNOSIS — E538 Deficiency of other specified B group vitamins: Secondary | ICD-10-CM | POA: Diagnosis not present

## 2020-11-13 DIAGNOSIS — J3089 Other allergic rhinitis: Secondary | ICD-10-CM | POA: Diagnosis not present

## 2020-11-13 DIAGNOSIS — R0789 Other chest pain: Secondary | ICD-10-CM | POA: Diagnosis not present

## 2020-11-13 DIAGNOSIS — F419 Anxiety disorder, unspecified: Secondary | ICD-10-CM | POA: Diagnosis not present

## 2020-11-13 NOTE — Telephone Encounter (Signed)
Can use ibuprofen 200 mg QID with meals as needed RG

## 2020-11-13 NOTE — Telephone Encounter (Signed)
Pt aware.

## 2020-11-17 ENCOUNTER — Telehealth: Payer: Self-pay | Admitting: Gastroenterology

## 2020-11-17 NOTE — Telephone Encounter (Signed)
ARM 11/04/2020 at [facility] c/w pelvic dyssynergia  Impression:            - Resting study reveals normal internal anal sphincter                        pressure.                       - Squeeze study reveals normal external anal sphincter                        pressure.                       - RAIR (Rectoanal Inhibitory Reflex) is present                        suggesting functioning myenteric plexus and absence of                        Hirschsprung's disease.                       - Normal sensation study                       - Successful evacuation of the defecation balloon.                       - Pelvic floor EMG reveals increased activity at rest                        with slight decreased activity with the squeeze                        maneuver.                       - Strain maneuver reveals paradoxical response due to                        dyssynergia.   Recommendation:        - Pelvic floor retraining (physical therapy or                        biofeedback) for correction of pelvic floor                        dyssynergia.  Linda  Please set up biofeedback at [facility] and inform patient  RG

## 2020-11-17 NOTE — Telephone Encounter (Signed)
FYI- Spoke with patient. Patient called in 6/22 with complaints of "inflammation" to chest wall. Patient was advised to take ibuprofen. Patient calling today with complaints of continued chest wall "flare up," feeling faint while trying to have a bowel movement related to straining.Patient reports decreased appetite, increased heart rate and constant stomach pain. Patient states she took one dose of children's Advil 200mg  on 6/25 and this when symptoms began. Patient states she took the Advil with a meal, however symptoms did not subside and are seemingly slightly worsened. Patient states she is not going to take Advil again and will take Tylenol.   Patient also inquiring about results of anorectal manometry procedure done at [facility]. Please Advise, thank you.

## 2020-11-20 ENCOUNTER — Other Ambulatory Visit: Payer: Self-pay

## 2020-11-20 DIAGNOSIS — M6289 Other specified disorders of muscle: Secondary | ICD-10-CM

## 2020-11-20 DIAGNOSIS — E538 Deficiency of other specified B group vitamins: Secondary | ICD-10-CM | POA: Diagnosis not present

## 2020-11-20 NOTE — Telephone Encounter (Signed)
Spoke with Donna Francis and let her know Dr. Lyndel Safe wanted to refer her to [facility]. Donna Francis states she cannot go to [facility] that is to far. She wants to be referred to [place] for the biofeedback. Referral entered. Donna Francis states she is having a terrible fibromyalgia flare, feels like she is having a heart attack, she has been in the hospital for this recently. Donna Francis states she has lost some weight. She wants to set up a zoom call with Dr. Lyndel Safe asap. Please advise.

## 2020-11-20 NOTE — Telephone Encounter (Signed)
I already put in the referral to the outpt rehab center at [address]. That is where we send [place] pts to that have pelvic floor dysfunction and need biofeedback. She is aware referral is being made there.

## 2020-11-20 NOTE — Telephone Encounter (Signed)
Donna Francis -Can you please find out if anyone in [place] does Pelvic floor retraining (physical therapy or biofeedback) for correction of pelvic floor dyssynergia?  To my knowledge, it is not available in [place]. Once we find out, then we can tell Donna Francis Please let me know  Donna Francis

## 2020-11-24 DIAGNOSIS — F411 Generalized anxiety disorder: Secondary | ICD-10-CM | POA: Diagnosis not present

## 2020-11-26 NOTE — Telephone Encounter (Signed)
Inbound call from patient requesting a follow up call regarding her referrals

## 2020-11-27 DIAGNOSIS — E538 Deficiency of other specified B group vitamins: Secondary | ICD-10-CM | POA: Diagnosis not present

## 2020-11-27 NOTE — Telephone Encounter (Signed)
Spoke with patient, patient states she will call back as she is in another MD office.

## 2020-11-27 NOTE — Telephone Encounter (Signed)
Spoke with patient. Patient reports she is still having stomach issues and it is getting worse.Pt states she is concerned about she can not gain weight. Says she can only eat low calorie foods and because everything else cause symptoms. Patient would like to know what the "second opinion" says and what else can be done to stop symptoms and gain weight. Please Advise  Telemed Call: 10/27/20- Donna Dingwall Mishra,MD  Assessment: 1. Abdominal pain: This appears multifactorial most likely related to irritable bowel syndrome and certainly adhesions and scar tissue could be at play. 2. Weight loss: The patient did denies any dysphagia or odynophagia and her weight loss has stabilized. I do think anxiety and a low caloric intake might be what has caused her to have inability to gain weight  Plan: 1. Abdominal pain: At the time I will wait on her results from her anorectal manometry at [facility] to make any further recommendations. She does not have what sounds like any pancreatic insufficiency. 2.Weight loss: Her weight loss has stabilized and she no longer has any diarrhea.

## 2020-11-27 NOTE — Telephone Encounter (Signed)
Patient returning call. Pt states she is now home, to please return call..  Thank you

## 2020-11-27 NOTE — Telephone Encounter (Signed)
Please see below.

## 2020-12-03 DIAGNOSIS — F411 Generalized anxiety disorder: Secondary | ICD-10-CM | POA: Diagnosis not present

## 2020-12-07 DIAGNOSIS — W19XXXA Unspecified fall, initial encounter: Secondary | ICD-10-CM | POA: Diagnosis not present

## 2020-12-07 DIAGNOSIS — M797 Fibromyalgia: Secondary | ICD-10-CM | POA: Diagnosis not present

## 2020-12-07 DIAGNOSIS — G4489 Other headache syndrome: Secondary | ICD-10-CM | POA: Diagnosis not present

## 2020-12-07 DIAGNOSIS — R079 Chest pain, unspecified: Secondary | ICD-10-CM | POA: Diagnosis not present

## 2020-12-07 DIAGNOSIS — R0789 Other chest pain: Secondary | ICD-10-CM | POA: Diagnosis not present

## 2020-12-07 DIAGNOSIS — R0902 Hypoxemia: Secondary | ICD-10-CM | POA: Diagnosis not present

## 2020-12-07 DIAGNOSIS — F419 Anxiety disorder, unspecified: Secondary | ICD-10-CM | POA: Diagnosis not present

## 2020-12-17 DIAGNOSIS — F411 Generalized anxiety disorder: Secondary | ICD-10-CM | POA: Diagnosis not present

## 2020-12-31 DIAGNOSIS — F411 Generalized anxiety disorder: Secondary | ICD-10-CM | POA: Diagnosis not present

## 2021-01-08 DIAGNOSIS — F411 Generalized anxiety disorder: Secondary | ICD-10-CM | POA: Diagnosis not present

## 2021-01-14 DIAGNOSIS — F411 Generalized anxiety disorder: Secondary | ICD-10-CM | POA: Diagnosis not present

## 2021-01-15 DIAGNOSIS — F419 Anxiety disorder, unspecified: Secondary | ICD-10-CM | POA: Diagnosis not present

## 2021-01-15 DIAGNOSIS — F4310 Post-traumatic stress disorder, unspecified: Secondary | ICD-10-CM | POA: Diagnosis not present

## 2021-01-15 DIAGNOSIS — F445 Conversion disorder with seizures or convulsions: Secondary | ICD-10-CM | POA: Diagnosis not present

## 2021-01-15 DIAGNOSIS — K582 Mixed irritable bowel syndrome: Secondary | ICD-10-CM | POA: Diagnosis not present

## 2021-01-15 DIAGNOSIS — R636 Underweight: Secondary | ICD-10-CM | POA: Diagnosis not present

## 2021-01-15 DIAGNOSIS — E538 Deficiency of other specified B group vitamins: Secondary | ICD-10-CM | POA: Diagnosis not present

## 2021-01-15 DIAGNOSIS — Z681 Body mass index (BMI) 19 or less, adult: Secondary | ICD-10-CM | POA: Diagnosis not present

## 2021-01-15 DIAGNOSIS — M940 Chondrocostal junction syndrome [Tietze]: Secondary | ICD-10-CM | POA: Diagnosis not present

## 2021-01-15 DIAGNOSIS — M797 Fibromyalgia: Secondary | ICD-10-CM | POA: Diagnosis not present

## 2021-01-15 DIAGNOSIS — F321 Major depressive disorder, single episode, moderate: Secondary | ICD-10-CM | POA: Diagnosis not present

## 2021-01-19 ENCOUNTER — Ambulatory Visit: Payer: Medicare Other | Admitting: Physical Therapy

## 2021-01-21 DIAGNOSIS — F411 Generalized anxiety disorder: Secondary | ICD-10-CM | POA: Diagnosis not present

## 2021-01-23 DIAGNOSIS — E538 Deficiency of other specified B group vitamins: Secondary | ICD-10-CM | POA: Diagnosis not present

## 2021-01-28 DIAGNOSIS — F411 Generalized anxiety disorder: Secondary | ICD-10-CM | POA: Diagnosis not present

## 2021-02-06 DIAGNOSIS — E538 Deficiency of other specified B group vitamins: Secondary | ICD-10-CM | POA: Diagnosis not present

## 2021-02-11 DIAGNOSIS — F411 Generalized anxiety disorder: Secondary | ICD-10-CM | POA: Diagnosis not present

## 2021-02-18 DIAGNOSIS — F411 Generalized anxiety disorder: Secondary | ICD-10-CM | POA: Diagnosis not present

## 2021-02-20 DIAGNOSIS — R1013 Epigastric pain: Secondary | ICD-10-CM | POA: Diagnosis not present

## 2021-02-20 DIAGNOSIS — Z681 Body mass index (BMI) 19 or less, adult: Secondary | ICD-10-CM | POA: Diagnosis not present

## 2021-02-22 DIAGNOSIS — R531 Weakness: Secondary | ICD-10-CM | POA: Diagnosis not present

## 2021-02-22 DIAGNOSIS — R634 Abnormal weight loss: Secondary | ICD-10-CM | POA: Diagnosis not present

## 2021-02-22 DIAGNOSIS — K2950 Unspecified chronic gastritis without bleeding: Secondary | ICD-10-CM | POA: Diagnosis not present

## 2021-02-22 DIAGNOSIS — M797 Fibromyalgia: Secondary | ICD-10-CM | POA: Diagnosis not present

## 2021-02-22 DIAGNOSIS — R5381 Other malaise: Secondary | ICD-10-CM | POA: Diagnosis not present

## 2021-02-22 DIAGNOSIS — R110 Nausea: Secondary | ICD-10-CM | POA: Diagnosis not present

## 2021-02-22 DIAGNOSIS — R0789 Other chest pain: Secondary | ICD-10-CM | POA: Diagnosis not present

## 2021-02-22 DIAGNOSIS — Z90710 Acquired absence of both cervix and uterus: Secondary | ICD-10-CM | POA: Diagnosis not present

## 2021-02-22 DIAGNOSIS — R079 Chest pain, unspecified: Secondary | ICD-10-CM | POA: Diagnosis not present

## 2021-02-22 DIAGNOSIS — K7689 Other specified diseases of liver: Secondary | ICD-10-CM | POA: Diagnosis not present

## 2021-02-22 DIAGNOSIS — R000 Tachycardia, unspecified: Secondary | ICD-10-CM | POA: Diagnosis not present

## 2021-02-22 DIAGNOSIS — F419 Anxiety disorder, unspecified: Secondary | ICD-10-CM | POA: Diagnosis not present

## 2021-02-26 DIAGNOSIS — F459 Somatoform disorder, unspecified: Secondary | ICD-10-CM | POA: Diagnosis not present

## 2021-02-26 DIAGNOSIS — R457 State of emotional shock and stress, unspecified: Secondary | ICD-10-CM | POA: Diagnosis not present

## 2021-02-26 DIAGNOSIS — F410 Panic disorder [episodic paroxysmal anxiety] without agoraphobia: Secondary | ICD-10-CM | POA: Diagnosis not present

## 2021-03-04 DIAGNOSIS — F411 Generalized anxiety disorder: Secondary | ICD-10-CM | POA: Diagnosis not present

## 2021-03-11 DIAGNOSIS — F411 Generalized anxiety disorder: Secondary | ICD-10-CM | POA: Diagnosis not present

## 2021-03-12 ENCOUNTER — Ambulatory Visit: Payer: Medicare Other | Admitting: Physical Therapy

## 2021-03-18 ENCOUNTER — Telehealth: Payer: Self-pay | Admitting: Gastroenterology

## 2021-03-18 NOTE — Telephone Encounter (Signed)
Pt states that's she is currently having severe GERD and chest pain that has been ongoing for months. Pt was scheduled for an OV with Tye Savoy NP on 03/25/2021 Virtual due to the fact that pt does not have transportation. Pt was encouraged multiple times that if she feels that she needs to go to the ED then DO NOT Hesitate. Pt verbalized understanding with all questions answered.

## 2021-03-18 NOTE — Telephone Encounter (Signed)
Inbound call from patient. States Donna Francis was having bad side effects from steroid shot 8/25. It cause her bad stomach pains, dizziness, and GERD. States her primary advised her to take Protonix and Donna Francis took it for about 4 days and then the acid began getting worse. Donna Francis believes it is her gastritis. Donna Francis began taking Pepcid and states the acid reflux got worse. Donna Francis would like a call back to discuss options because Donna Francis is very weak, stomaching burning on and off/

## 2021-03-20 NOTE — Telephone Encounter (Signed)
Pt was called and reminded of the Virtual Office Visit with Tye Savoy on 03/25/2021 @ 1:30 . Pt was instructed to check her mychart for  instructions for the Virtual Visit. Pt verbalized understanding.  Pt stated that she still had the abdominal pain along with the severe reflux. Pt states that she feels the chest pain is related to the reflux and that she recently went to the ED thinking it was cardiac related  but they revealed that it was not. Pt was encouraged to go to the ED if she has Severe Chest pain and dont' hesitate. Pt verbalized understanding with all questions answered.

## 2021-03-22 ENCOUNTER — Other Ambulatory Visit: Payer: Self-pay

## 2021-03-22 ENCOUNTER — Emergency Department (HOSPITAL_COMMUNITY)
Admission: EM | Admit: 2021-03-22 | Discharge: 2021-03-22 | Disposition: A | Discharge status: Home / Self Care | Payer: Medicare Other | Attending: Emergency Medicine | Admitting: Emergency Medicine

## 2021-03-22 ENCOUNTER — Emergency Department (HOSPITAL_COMMUNITY): Payer: Medicare Other

## 2021-03-22 ENCOUNTER — Encounter (HOSPITAL_COMMUNITY): Payer: Self-pay

## 2021-03-22 DIAGNOSIS — R630 Anorexia: Secondary | ICD-10-CM | POA: Insufficient documentation

## 2021-03-22 DIAGNOSIS — K219 Gastro-esophageal reflux disease without esophagitis: Secondary | ICD-10-CM | POA: Insufficient documentation

## 2021-03-22 DIAGNOSIS — R109 Unspecified abdominal pain: Secondary | ICD-10-CM | POA: Diagnosis not present

## 2021-03-22 DIAGNOSIS — R1013 Epigastric pain: Secondary | ICD-10-CM | POA: Diagnosis not present

## 2021-03-22 DIAGNOSIS — R634 Abnormal weight loss: Secondary | ICD-10-CM | POA: Diagnosis not present

## 2021-03-22 DIAGNOSIS — J45909 Unspecified asthma, uncomplicated: Secondary | ICD-10-CM | POA: Diagnosis not present

## 2021-03-22 DIAGNOSIS — N9489 Other specified conditions associated with female genital organs and menstrual cycle: Secondary | ICD-10-CM | POA: Insufficient documentation

## 2021-03-22 DIAGNOSIS — R1084 Generalized abdominal pain: Secondary | ICD-10-CM

## 2021-03-22 DIAGNOSIS — R9431 Abnormal electrocardiogram [ECG] [EKG]: Secondary | ICD-10-CM | POA: Diagnosis not present

## 2021-03-22 LAB — CBC WITH DIFFERENTIAL/PLATELET
Abs Immature Granulocytes: 0.01 10*3/uL (ref 0.00–0.07)
Basophils Absolute: 0 10*3/uL (ref 0.0–0.1)
Basophils Relative: 1 %
Eosinophils Absolute: 0.1 10*3/uL (ref 0.0–0.5)
Eosinophils Relative: 2 %
HCT: 40.4 % (ref 36.0–46.0)
Hemoglobin: 13.4 g/dL (ref 12.0–15.0)
Immature Granulocytes: 0 %
Lymphocytes Relative: 33 %
Lymphs Abs: 1.4 10*3/uL (ref 0.7–4.0)
MCH: 31.2 pg (ref 26.0–34.0)
MCHC: 33.2 g/dL (ref 30.0–36.0)
MCV: 94.2 fL (ref 80.0–100.0)
Monocytes Absolute: 0.8 10*3/uL (ref 0.1–1.0)
Monocytes Relative: 17 %
Neutro Abs: 2.1 10*3/uL (ref 1.7–7.7)
Neutrophils Relative %: 47 %
Platelets: 302 10*3/uL (ref 150–400)
RBC: 4.29 MIL/uL (ref 3.87–5.11)
RDW: 11.5 % (ref 11.5–15.5)
WBC: 4.3 10*3/uL (ref 4.0–10.5)
nRBC: 0 % (ref 0.0–0.2)

## 2021-03-22 LAB — I-STAT BETA HCG BLOOD, ED (MC, WL, AP ONLY): I-stat hCG, quantitative: <5 m[IU]/mL (ref <5)

## 2021-03-22 LAB — URINALYSIS, ROUTINE W REFLEX MICROSCOPIC
Bacteria, UA: NONE SEEN
Bilirubin Urine: NEGATIVE
Glucose, UA: NEGATIVE mg/dL
Hgb urine dipstick: NEGATIVE
Ketones, ur: 20 mg/dL — AB
Nitrite: NEGATIVE
Protein, ur: NEGATIVE mg/dL
Specific Gravity, Urine: 1.019 (ref 1.005–1.030)
pH: 6 (ref 5.0–8.0)

## 2021-03-22 LAB — COMPREHENSIVE METABOLIC PANEL
ALT: 21 U/L (ref 0–44)
AST: 23 U/L (ref 15–41)
Albumin: 4.5 g/dL (ref 3.5–5.0)
Alkaline Phosphatase: 61 U/L (ref 38–126)
Anion gap: 10 (ref 5–15)
BUN: 8 mg/dL (ref 6–20)
CO2: 29 mmol/L (ref 22–32)
Calcium: 10 mg/dL (ref 8.9–10.3)
Chloride: 93 mmol/L — ABNORMAL LOW (ref 98–111)
Creatinine, Ser: 0.44 mg/dL (ref 0.44–1.00)
GFR, Estimated: >60 mL/min (ref >60)
Glucose, Bld: 92 mg/dL (ref 70–99)
Potassium: 4.3 mmol/L (ref 3.5–5.1)
Sodium: 132 mmol/L — ABNORMAL LOW (ref 135–145)
Total Bilirubin: 0.6 mg/dL (ref 0.3–1.2)
Total Protein: 8.4 g/dL — ABNORMAL HIGH (ref 6.5–8.1)

## 2021-03-22 LAB — LIPASE, BLOOD: Lipase: 32 U/L (ref 11–51)

## 2021-03-22 LAB — TROPONIN I (HIGH SENSITIVITY): Troponin I (High Sensitivity): <2 ng/L (ref <18)

## 2021-03-22 MED ORDER — LACTATED RINGERS IV BOLUS
500.0000 mL | Freq: Once | INTRAVENOUS | Status: AC
  Administered 2021-03-22: 500 mL via INTRAVENOUS

## 2021-03-22 MED ORDER — LORAZEPAM 2 MG/ML IJ SOLN
1.0000 mg | Freq: Once | INTRAMUSCULAR | Status: DC
  Filled 2021-03-22: qty 1

## 2021-03-22 MED ORDER — IOHEXOL 350 MG/ML SOLN
50.0000 mL | Freq: Once | INTRAVENOUS | Status: AC | PRN
  Administered 2021-03-22: 50 mL via INTRAVENOUS

## 2021-03-22 MED ORDER — LIDOCAINE VISCOUS HCL 2 % MT SOLN
15.0000 mL | Freq: Once | OROMUCOSAL | Status: AC
  Administered 2021-03-22: 15 mL via ORAL
  Filled 2021-03-22: qty 15

## 2021-03-22 MED ORDER — ALUM & MAG HYDROXIDE-SIMETH 200-200-20 MG/5ML PO SUSP
30.0000 mL | Freq: Once | ORAL | Status: AC
  Administered 2021-03-22: 30 mL via ORAL
  Filled 2021-03-22: qty 30

## 2021-03-22 NOTE — ED Notes (Signed)
Able to ambulate

## 2021-03-22 NOTE — ED Notes (Signed)
D/c paperwork reviewed with pt, including f/u care. Pt verbalized understanding, no questions at this time. Pt reports her daughter is coming from [place] to get her.

## 2021-03-22 NOTE — Discharge Instructions (Addendum)
You are seen in the ER today for abdominal pain.  Your physical exam, vital signs, blood work and CT scan were very reassuring.  You may take the Bentyl at home for your abdominal cramping as needed and should discuss your heartburn with Dr. Steve Rattler team, as you are unable to tolerate proton pump inhibitors.  Return to the ER with any new severe symptoms.

## 2021-03-22 NOTE — ED Provider Notes (Signed)
[facility] DEPT Provider Note   CSN: [id number] Arrival date & time: 03/22/21  0156     History Chief Complaint  Patient presents with   Abdominal Pain    Donna Francis is a 60 y.o. female with extensive history of abdominal pain who presents today with epigastric pain and heartburn sensation for the last month.  Patient was seen by her gastroenterologist Dr. Lyndel Safe who recommended she takes Protonix.  Patient states that the Protonix makes her heartburn worse and that she cannot take omeprazole because "I took it 1 time and immediately after swallowing it I passed out".  She denies any vomiting or diarrhea but does endorse very poor p.o. intake due to her heartburn and stating that all of her food intake makes her pain worse.  She states she is primarily been eating fruits and has been drinking very little.  Patient endorses significant weight loss over the last several years, now only weighing 33 kg.  Patient is extremely anxious.  I have personally reviewed this patient's medical records.  She has history of fibromyalgia, IBS, pseudoseizures, rheumatoid arthritis, and GERD.  Additionally has history of generalized anxiety disorder for which she is on 1 mg of Xanax 4 times per day.  HPI     Past Medical History:  Diagnosis Date   Allergy    Anemia    Anxiety    Asthma    pt states diagnosed by Pulmonologist   Colitis    Fibromyalgia    GERD (gastroesophageal reflux disease)    pt reports buringing in abdomen that has recently started moving up higher into her chest   IBS (irritable bowel syndrome)    Interstitial cystitis    Panic attacks    Rapid heartbeat    Rheumatoid arthritis ([place])    Seizures (HCC)    UTI (urinary tract infection)    Varicose veins of bilateral lower extremities with pain     Patient Active Problem List   Diagnosis Date Noted   Precordial pain 04/16/2020   Shortness of breath 04/16/2020   Mixed hyperlipidemia  04/16/2020   Abdominal pain 04/14/2020   Atypical chest pain 04/14/2020   Weight loss 04/14/2020   Allergy    Asthma    Colitis    Fibromyalgia    GERD (gastroesophageal reflux disease)    IBS (irritable bowel syndrome)    Interstitial cystitis    Panic attacks    Rapid heartbeat    Rheumatoid arthritis ([place])    Seizures ([place])    UTI (urinary tract infection)    Varicose veins of bilateral lower extremities with pain    Malnutrition ([place]) 12/17/2019   Seizure-like activity ([place]) 10/18/2017   Anxiety 03/08/2016   Anemia, unspecified 12/25/2015   Palpitations 12/25/2015   Episodic mood disorder ([place]) 11/17/2010   Generalized anxiety disorder 11/17/2010    Past Surgical History:  Procedure Laterality Date   CESAREAN SECTION     x 4   COLONOSCOPY  1995   in [place]  03/07/2020   [facility] Dr Lyda Jester. Normal upper endoscopy   LAPAROSCOPIC HYSTERECTOMY  04/26/2016   TUBAL LIGATION       OB History   No obstetric history on file.     Family History  Problem Relation Age of Onset   Pancreatic cancer Mother    Diabetes Mother    Colon cancer Neg Hx    Esophageal cancer Neg Hx    Rectal cancer Neg  Hx    Stomach cancer Neg Hx     Social History   Tobacco Use   Smoking status: Never   Smokeless tobacco: Never  Vaping Use   Vaping Use: Never used  Substance Use Topics   Alcohol use: Not Currently    Comment: minimal   Drug use: No    Home Medications Prior to Admission medications   Medication Sig Start Date End Date Taking? Authorizing Provider  acetaminophen (TYLENOL) 500 MG tablet Take 500 mg by mouth 3 (three) times daily.   Yes [provider]  ALPRAZolam Duanne Moron) 1 MG tablet Take 1 mg by mouth 4 (four) times daily.   Yes [provider]  Alum & Mag Hydroxide-Simeth (MYLANTA PO) Take 1 tablet by mouth as needed.   Yes [provider]  lidocaine (XYLOCAINE) 2 % solution Use as directed  15 mLs in the mouth or throat as needed for mouth pain.   Yes [provider]  loratadine (CLARITIN) 5 MG chewable tablet Chew 10 mg by mouth daily.   Yes [provider]  montelukast (SINGULAIR) 5 MG chewable tablet Chew 5 mg by mouth daily. 11/13/20  Yes [provider]  ondansetron (ZOFRAN ODT) 4 MG disintegrating tablet Take 1 tablet (4 mg total) by mouth every 6 (six) hours as needed for nausea or vomiting. Patient not taking: Reported on 03/22/2021 06/02/20   Jackquline Denmark, MD    Allergies    Propofol and Proton pump inhibitors  Review of Systems   Review of Systems  Constitutional:  Positive for appetite change and fatigue. Negative for activity change, chills, diaphoresis and fever.  HENT: Negative.    Respiratory: Negative.    Cardiovascular:  Positive for chest pain. Negative for palpitations and leg swelling.  Gastrointestinal:  Positive for abdominal pain and nausea. Negative for anal bleeding, blood in stool, constipation, diarrhea and vomiting.  Genitourinary: Negative.   Musculoskeletal: Negative.   Skin: Negative.   Neurological: Negative.   Hematological: Negative.   Psychiatric/Behavioral:  The patient is nervous/anxious.    Physical Exam Updated Vital Signs BP (!) 145/93   Pulse (!) 105   Temp 98.2 F (36.8 C) (Oral)   Resp 20   Ht 4' 8"  (1.422 m)   Wt 33.1 kg   SpO2 98%   BMI 16.37 kg/m   Physical Exam Vitals and nursing note reviewed.  Constitutional:      Appearance: She is cachectic. She is not ill-appearing or toxic-appearing.  HENT:     Head: Normocephalic and atraumatic.     Nose: Nose normal.     Mouth/Throat:     Mouth: Mucous membranes are moist.     Pharynx: Oropharynx is clear. Uvula midline. No oropharyngeal exudate or posterior oropharyngeal erythema.     Tonsils: No tonsillar exudate.  Eyes:     General: Lids are normal. Vision grossly intact.        Right eye: No discharge.        Left eye: No discharge.      Extraocular Movements: Extraocular movements intact.     Conjunctiva/sclera: Conjunctivae normal.     Pupils: Pupils are equal, round, and reactive to light.  Neck:     Trachea: Trachea and phonation normal.  Cardiovascular:     Rate and Rhythm: Normal rate and regular rhythm.     Pulses: Normal pulses.     Heart sounds: Normal heart sounds. No murmur heard. Pulmonary:     Effort: Pulmonary effort is  normal. No tachypnea, bradypnea, accessory muscle usage, prolonged expiration or respiratory distress.     Breath sounds: Normal breath sounds. No wheezing or rales.  Chest:     Chest wall: No mass, lacerations, deformity, swelling, tenderness, crepitus or edema.  Abdominal:     General: Abdomen is scaphoid. Bowel sounds are normal. There is no distension.     Palpations: Abdomen is soft.     Tenderness: There is generalized abdominal tenderness and tenderness in the epigastric area, periumbilical area and left upper quadrant. There is no right CVA tenderness, left CVA tenderness, guarding or rebound.  Musculoskeletal:        General: No deformity.     Cervical back: Normal range of motion and neck supple. No edema, rigidity or crepitus. No pain with movement, spinous process tenderness or muscular tenderness.     Right lower leg: No edema.     Left lower leg: No edema.  Lymphadenopathy:     Cervical: No cervical adenopathy.  Skin:    General: Skin is warm and dry.  Neurological:     Mental Status: She is alert. Mental status is at baseline.     GCS: GCS eye subscore is 4. GCS verbal subscore is 5. GCS motor subscore is 6.     Sensory: Sensation is intact.     Motor: Motor function is intact.     Gait: Gait is intact.  Psychiatric:        Attention and Perception: Attention normal.        Mood and Affect: Mood is anxious. Affect is tearful.        Speech: Speech is rapid and pressured.        Thought Content: Thought content does not include homicidal or suicidal ideation.    ED  Results / Procedures / Treatments   Labs (all labs ordered are listed, but only abnormal results are displayed) Labs Reviewed  COMPREHENSIVE METABOLIC PANEL - Abnormal; Notable for the following components:      Result Value   Sodium 132 (*)    Chloride 93 (*)    Total Protein 8.4 (*)    All other components within normal limits  URINALYSIS, ROUTINE W REFLEX MICROSCOPIC - Abnormal; Notable for the following components:   Color, Urine STRAW (*)    Ketones, ur 20 (*)    Leukocytes,Ua LARGE (*)    All other components within normal limits  CBC WITH DIFFERENTIAL/PLATELET  LIPASE, BLOOD  I-STAT BETA HCG BLOOD, ED ([facility], [facility], [facility] ONLY)  TROPONIN I (HIGH SENSITIVITY)    EKG None  Radiology CT Abdomen Pelvis W Contrast  Result Date: 03/22/2021 CLINICAL DATA:  Upper abdominal pain. EXAM: CT ABDOMEN AND PELVIS WITH CONTRAST TECHNIQUE: Multidetector CT imaging of the abdomen and pelvis was performed using the standard protocol following bolus administration of intravenous contrast. CONTRAST:  74m OMNIPAQUE IOHEXOL 350 MG/ML SOLN COMPARISON:  None. FINDINGS: Lower chest: Lung bases are clear. Hepatobiliary: No focal hepatic lesion. No biliary duct dilatation. Common bile duct is normal. Pancreas: Pancreas is normal. No ductal dilatation. No pancreatic inflammation. Spleen: Normal spleen Adrenals/urinary tract: Adrenal glands and kidneys are normal. The ureters and bladder normal. Stomach/Bowel: Stomach, small bowel, appendix, and cecum are normal. The colon and rectosigmoid colon are normal. Vascular/Lymphatic: Abdominal aorta is normal caliber. No periportal or retroperitoneal adenopathy. No pelvic adenopathy. Reproductive: Post hysterectomy.  Adnexa unremarkable Other: No free fluid. Musculoskeletal: No aggressive osseous lesion. IMPRESSION: No acute findings in the abdomen pelvis. Electronically Signed  By: Suzy Bouchard M.D.   On: 03/22/2021 10:55    Procedures Procedures   Medications  Ordered in ED Medications  lactated ringers bolus 500 mL (0 mLs Intravenous Stopped 03/22/21 1043)  iohexol (OMNIPAQUE) 350 MG/ML injection 50 mL (50 mLs Intravenous Contrast Given 03/22/21 1002)  alum & mag hydroxide-simeth (MAALOX/MYLANTA) 200-200-20 MG/5ML suspension 30 mL (30 mLs Oral Given 03/22/21 1113)    And  lidocaine (XYLOCAINE) 2 % viscous mouth solution 15 mL (15 mLs Oral Given 03/22/21 1113)    ED Course  I have reviewed the triage vital signs and the nursing notes.  Pertinent labs & imaging results that were available during my care of the patient were reviewed by me and considered in my medical decision making (see chart for details).  Clinical Course as of 03/22/21 1343  Sun Mar 22, 2021  1306 ALT: 21 [RS]    Clinical Course User Index [RS] Kely Dohn, Gypsy Balsam, PA-C   MDM Rules/Calculators/A&P                         60 year old female with history of chronic abdominal pain and severe anxiety who presents with concern for epigastric pain x1 month.  Not taking any PPI due to myriad of reported side effects.  Differential diagnosis is broad and includes but is not limited to, gastritis, pancreatitis, bowel obstruction, ACS, anxiety.  Hypertensive and tachycardic on intake, extremely anxious, tearful.  Cardiopulmonary exam is normal, abdominal exam with generalized tenderness to palpation worse in the epigastrium and left upper quadrant.  CBC unremarkable, CMP with mild hyponatremia of 132 otherwise unremarkable.  UA unremarkable, troponin normal and lipase is normal.  CT of the abdomen pelvis without acute abnormality.  Patient reevaluated and is much [place] and more comfortable after taking one of her home Xanax as well in the emergency department.  Patient had initially refused dose of Ativan that had been offered.  States that her pain improved with GI cocktail.  Patient has prescription for Bentyl at home.  She may try this for her abdominal cramping as needed.  No  further work-up warranted in the ER at this time given reassuring physical exam, vital signs, labs, and imaging.  Suspect patient's symptoms are contributed to by her severe anxiety.  Recommend she continue seeing her counselor and follow-up with Dr. Lyndel Safe, her gastroenterologist as scheduled for Wednesday of this week.  Cyan voiced understanding for medical evaluation and treatment plan.  Each of her questions was answered to her expressed satisfaction.  Return precautions were given.  Patient is stable and appropriate for discharge at this time.  This chart was dictated using voice recognition software, Dragon. Despite the best efforts of this provider to proofread and correct errors, errors may still occur which can change documentation meaning.  Final Clinical Impression(s) / ED Diagnoses Final diagnoses:  Generalized abdominal pain    Rx / DC Orders ED Discharge Orders     None        Aura Dials 03/22/21 1343    Valarie Merino, MD 03/22/21 1458

## 2021-03-22 NOTE — ED Notes (Signed)
Pt aware urine sample needed 

## 2021-03-22 NOTE — ED Triage Notes (Signed)
Pt BIB EMS. Pt complains of abdominal pain x 1 month. The pain is located in both upper quadrants.

## 2021-03-22 NOTE — ED Notes (Signed)
Labeled specimen cup provided to pt for urine collection per MD order. ENMiles 

## 2021-03-23 ENCOUNTER — Telehealth: Payer: Self-pay | Admitting: Gastroenterology

## 2021-03-23 NOTE — Telephone Encounter (Signed)
Pt stated that she took a GI cocktail and that her symptoms have started to resolve some. Pt was encouraged to take some imodium to help with the diarrhea. Pt verbalized understanding with all questions answered. Pt given the My Chart Help number as pt stated that she got locked out of her my chart.

## 2021-03-23 NOTE — Telephone Encounter (Signed)
Inbound call from patient . States she was recently in the ED for abd pain and believes it is gastritis and is feeling the pain in her chest and have watery diarrhea. States she [place] unsure what can stop it. Would like a call back to discuss options. States she can not wait till her appt 11/2

## 2021-03-25 ENCOUNTER — Telehealth (INDEPENDENT_AMBULATORY_CARE_PROVIDER_SITE_OTHER): Payer: Medicare Other | Admitting: Nurse Practitioner

## 2021-03-25 ENCOUNTER — Ambulatory Visit: Payer: Medicare Other | Admitting: Nurse Practitioner

## 2021-03-25 DIAGNOSIS — R1013 Epigastric pain: Secondary | ICD-10-CM

## 2021-03-25 DIAGNOSIS — G8929 Other chronic pain: Secondary | ICD-10-CM | POA: Diagnosis not present

## 2021-03-25 DIAGNOSIS — F411 Generalized anxiety disorder: Secondary | ICD-10-CM | POA: Diagnosis not present

## 2021-03-25 DIAGNOSIS — K219 Gastro-esophageal reflux disease without esophagitis: Secondary | ICD-10-CM | POA: Diagnosis not present

## 2021-03-25 NOTE — Progress Notes (Signed)
Telephonic Visit in setting of Donna Francis  Patient has given consent for a telephonic visit today and understands that is the same as is required for any face-to-face patient encounter except that the service was provided via telephone.   At the time of this telephonic visit the patient was located at home and I, the Provider, at the office of [facility].   No one other than the patient and I participated in this virtual visit today.    Total time of telephonic visit :  20 minutes. We lost video / camera connection 10 minutes into the visit but were finally able to reconnect video for another 10 minutes.    ASSESSMENT AND PLAN    # 60 yo female with chronic multiple GI issues not limited to chronic abdominal pain / GERD / chronic constipation.  Multiple unrevealing CT scans in the past. Only gastritis on EGD in December 2021. Currently having epigastric pain / chest pain. ED visit on 10/30 was unrevealing with workup including labs and repeat CTAP. Getting some relief with Mylanta + lidocaine, Bowel anti-spasmodics nor TCAs helpful in past  Her symptoms have been felt to be significantly exacerbated by her anxiety.  --PPIs have either not been beneficial or have caused side effects. However she inquires about trying Prevacid OTC. I told her that Prevacid is likely to have similar results as other PPI's but she can try it 30 minutes before breakfast.  --Continue Lidocaine + Mylanta TID as needed --She reports ongoing weight loss  Albumin was > 4 in ED so that is encouraging  She used to take Ensure but it was too costly --Will ask Dr. Steve Rattler nurse to call patient next week and see how things are going. If still having problems then she will need to be seen in person    HISTORY OF PRESENT ILLNESS    Chief Complaint : upper abdominal pain, chest pain  Donna Francis is a 60 y.o. female known to Dr. Lyndel Safe  with a past medical history significant for fibromyalgia, IC, anxiety,  depression, panic disorder, conversion disorder and pseudoseizures, rheumatoid arthritis, chronic constipation with pelvic dyssynergia, GERD . See PMH below for any additional medical problems.     Patient last seen by Dr. Lyndel Safe May 2022 for chronic abdominal pain, chronic constipation, weight loss, GERD. She had a sitz marker study which showed no retained markers. She had an anal manometry through [facility] which suggested dyssynergia.  She has a history of chronic abdominal pain with multiple negative CT scans. She didn't respond to bentyl nor amitriptyline.   Cory called the office with complaints of upper abdominal pain, dizziness and severe GERD symptoms.  Her symptoms started late August following what sounds like a steroid injection. She read about steroids and feels it caused gastritis. PCP recommended Pantoprazole but after 4 days her symptoms were worse so she stopped it. PPIs previously havn't worked for her or have caused side effects. Her chest still burns, throat is aching. Seen in ED for evaluation of symptoms  ED on 10/30  - Troponin negative, CMP ok,  CBC ok. CTAP w/ contrast without acute findings.  She sleeps propped up, goes to bed on an empty stomach and avoid caffeine. .She couldn't come in for appointment to see Dr. Lyndel Safe due to transportation problems. Over the last few weeks she has been  taking Mylanta and lidocaine (mixing it herself) and this helps. In the past she hasn't responded to, or had side effects  to carafate, pepcid, omeprazole, and nexium. A pharmacist told her about Prevacid OTC and she is interested in trying it. Marland Kitchen     PREVIOUS ENDOSCOPIC EVALUATIONS / PERTINENT STUDIES:   Past Medical History:  Diagnosis Date   Allergy    Anemia    Anxiety    Asthma    pt states diagnosed by Pulmonologist   Colitis    Fibromyalgia    GERD (gastroesophageal reflux disease)    pt reports buringing in abdomen that has recently started moving up higher into her chest   IBS  (irritable bowel syndrome)    Interstitial cystitis    Panic attacks    Rapid heartbeat    Rheumatoid arthritis ([place])    Seizures (HCC)    UTI (urinary tract infection)    Varicose veins of bilateral lower extremities with pain     Current Medications, Allergies, Past Surgical History, Family History and Social History were reviewed in [employer] record.   Current Outpatient Medications  Medication Sig Dispense Refill   acetaminophen (TYLENOL) 500 MG tablet Take 500 mg by mouth 3 (three) times daily.     ALPRAZolam (XANAX) 1 MG tablet Take 1 mg by mouth 4 (four) times daily.     Alum & Mag Hydroxide-Simeth (MYLANTA PO) Take 1 tablet by mouth as needed.     lidocaine (XYLOCAINE) 2 % solution Use as directed 15 mLs in the mouth or throat as needed for mouth pain.     loratadine (CLARITIN) 5 MG chewable tablet Chew 10 mg by mouth daily.     montelukast (SINGULAIR) 5 MG chewable tablet Chew 5 mg by mouth daily.     ondansetron (ZOFRAN ODT) 4 MG disintegrating tablet Take 1 tablet (4 mg total) by mouth every 6 (six) hours as needed for nausea or vomiting. (Patient not taking: Reported on 03/22/2021) 20 tablet 1   No current facility-administered medications for this visit.    Review of Systems:  No shortness of breath. No urinary complaints.   PHYSICAL EXAM :    Wt Readings from Last 3 Encounters:  03/22/21 73 lb (33.1 kg)  09/23/20 73 lb 6 oz (33.3 kg)  06/13/20 71 lb 2 oz (32.3 kg)    There were no vitals taken for this visit. Virtual visit  Constitutional:  Thin female in NAD Psychiatric: Pleasant. Talkative.  Neurological: Alert and oriented to person place and time.  Tye Savoy, NP  03/25/2021, 1:48 PM

## 2021-03-29 ENCOUNTER — Encounter: Payer: Self-pay | Admitting: Nurse Practitioner

## 2021-04-01 DIAGNOSIS — F411 Generalized anxiety disorder: Secondary | ICD-10-CM | POA: Diagnosis not present

## 2021-04-08 ENCOUNTER — Telehealth: Payer: Self-pay | Admitting: Nurse Practitioner

## 2021-04-08 DIAGNOSIS — F411 Generalized anxiety disorder: Secondary | ICD-10-CM | POA: Diagnosis not present

## 2021-04-08 NOTE — Telephone Encounter (Signed)
Patient is calling to follow up with the office said on her last video visit she was told a nurse would be follow up with her within a week and she has not heard anything. Requested a call back so she know what to do next.

## 2021-04-08 NOTE — Telephone Encounter (Signed)
Returned the patient's call. No answer. Left her a message to please reach out to [place] through My Chart or by calling us again.

## 2021-04-08 NOTE — Telephone Encounter (Signed)
Donna Francis Multiple complaints from the patient. I have helped her schedule an appointment with Dr Lyndel Safe which seems to be what she wants. She reports she stopped the Mylanta+Lidocaine 7 days ago and turned it over to God. She is frustrated with our phone system and told me about this for more than half of the phone call. She also tells me her nurse "must not be getting the messages to Dr Lyndel Safe right, because he doesn't call me back."  She will see Dr Lyndel Safe on 04/27/21 at 11:20.

## 2021-04-13 DIAGNOSIS — Z1231 Encounter for screening mammogram for malignant neoplasm of breast: Secondary | ICD-10-CM | POA: Diagnosis not present

## 2021-04-20 ENCOUNTER — Ambulatory Visit: Payer: Medicare Other | Admitting: Physical Therapy

## 2021-04-22 DIAGNOSIS — F411 Generalized anxiety disorder: Secondary | ICD-10-CM | POA: Diagnosis not present

## 2021-04-27 ENCOUNTER — Other Ambulatory Visit: Payer: Self-pay

## 2021-04-27 ENCOUNTER — Ambulatory Visit (INDEPENDENT_AMBULATORY_CARE_PROVIDER_SITE_OTHER): Payer: Medicare Other | Admitting: Gastroenterology

## 2021-04-27 ENCOUNTER — Encounter: Payer: Self-pay | Admitting: Gastroenterology

## 2021-04-27 VITALS — HR 86 | Wt <= 1120 oz

## 2021-04-27 DIAGNOSIS — K5909 Other constipation: Secondary | ICD-10-CM

## 2021-04-27 DIAGNOSIS — G8929 Other chronic pain: Secondary | ICD-10-CM | POA: Diagnosis not present

## 2021-04-27 DIAGNOSIS — R109 Unspecified abdominal pain: Secondary | ICD-10-CM

## 2021-04-27 DIAGNOSIS — K219 Gastro-esophageal reflux disease without esophagitis: Secondary | ICD-10-CM | POA: Diagnosis not present

## 2021-04-27 NOTE — Patient Instructions (Signed)
If you are age 58 or older, your body mass index should be between 23-30. Your Body mass index is 15.55 kg/m. If this is out of the aforementioned range listed, please consider follow up with your Primary Care Provider.  If you are age 55 or younger, your body mass index should be between 19-25. Your Body mass index is 15.55 kg/m. If this is out of the aformentioned range listed, please consider follow up with your Primary Care Provider.   ________________________________________________________  The  GI providers would like to encourage you to use [facility] to communicate with providers for non-urgent requests or questions.  Due to long hold times on the telephone, sending your provider a message by [facility] may be a faster and more efficient way to get a response.  Please allow 48 business hours for a response.  Please remember that this is for non-urgent requests.  _______________________________________________________  Increase water intake  Do Boost 1 a day for 2 weeks  Please call with any questions or concerns.  Thank you,  Dr. Jackquline Denmark

## 2021-04-27 NOTE — Progress Notes (Signed)
Chief Complaint: FU   Referring Provider:  Dr Delena Bali        ASSESSMENT AND PLAN;    #1.   Chronic constipation. H/O fecal impaction s/p manual disimpaction 05/2020.  Subsequent rectal exams neg.  Stool soft heme-neg. Neg stool studies, cologuard test 04/2017, virtual colon 11/2017. Failed bentyl, amitriptyline. Refuses colon.  #2. Chronic abdo/pelvic pain (since hysterectomy 04/2016). Dx with IC by urology, fibromyalgia/anxiety/depression/panic attacks/conversion disorder/pseudoseizures. Multiple ED visits. Multiple neg CT scans A/P- 03/28/2019, 03/25/2019, 02/14/2019, 12/2016, 07/2017, 12/2018.    #3. Wt loss with neg multiple CTs 07/27/2017, 12/2018, 02/14/2019, 03/28/2019, 03/25/2019. Nl CBC, CMP, TSH. Neg Virtual colonoscopy 11/22/2017. I believe situational anxiety (after hystrectomy per pt) playing a significant role.   #4. GERD with neg Ba Swallow 04/2018.  Neg EGD 04/24/2019, 02/2020. Failed carafate, GI coctail, pepcid, protonix, omeprazole, nexium (caused burning) ("can't do PPIs").   #5. SIGNIFICANT ANXIETY/DEPRESSION/PANIC DISORDER/CONVERSION DISORDER/PTSD    Plan:   -Increase water intake. -Boost 1 can/day x 2 weeks -Reassured pt -is still with problems, would consider repeat CT Abdo/pelvis.   HPI:     Particia Francis is a 60 y.o. female  With fibromyalgia, anxiety/depression, costochondritis  For follow-up visit.  Had kenolog shot and then started having more GI problems -more reflux, postprandial abdominal pain.  Getting better with Mylanta plus lidocaine.  Currently not taking any medications.  Did have decreased appetite and had lost weight as below.  Constipation is better.  At times would have crampy nonspecific abdominal pain attributed to spasms.  These do get better with defecation.   Wt Readings from Last 3 Encounters:  04/27/21 69 lb 6 oz (31.5 kg)  03/22/21 73 lb (33.1 kg)  09/23/20 73 lb 6 oz (33.3 kg)   Her cardiology work-up has been neg.  She has  done well with the behavioral therapy.     From previous records:  -occ dysphagia. S/P EGD by Dr M Oct 2021 at [facility] while inpt. Has problems waking up. Thereafter, had dysphagia (with solids and liquids) and odynophagia and abdo pain d/t spasms. Convinced that most of problems are related to recent EGD  -Chronic abd pain with associated nausea but no vomiting, feeling dizzy, weakness, occasional shortness of breath, fatigue, headaches, palpitations, back pain, belching, unexplained weight loss, "a lot of gas"  -Can only eat chicken, protein shake, 1 pediasure.  Tolerating small meals.   -S/p EGD 04/24/2019 showing mild gastritis.  Biopsies were negative for H. Pylori.  SB Bx- neg for celiac disease.  Intolerance to multiple PPIs.  -She is under care of [name] -psychologist in [place].  She really likes her.  Situational anxiety is getting under control.  -All her symptoms started after she had a hysterectomy 04/2016.  She has been seen by GYN at [facility].  I have reviewed the notes in care everywhere.  It has been recommended to start pelvic physical therapy.   -Refuses colonoscopy.      Past Medical History:  Diagnosis Date   Anxiety     Colitis     IBS (irritable bowel syndrome)     Varicose veins of bilateral lower extremities with pain             Past Surgical History:  Procedure Laterality Date   ABDOMINAL HYSTERECTOMY   04/2016   CESAREAN SECTION        x 4   COLONOSCOPY   1995    in [place]  Family History  Problem Relation Age of Onset   Pancreatic cancer Mother        Social History         Tobacco Use   Smoking status: Never Smoker   Smokeless tobacco: Never Used  Substance Use Topics   Alcohol use: Yes      Comment: minimal   Drug use: No            Current Outpatient Medications  Medication Sig Dispense Refill   ALPRAZolam (XANAX) 0.5 MG tablet Take 0.5 mg by mouth 3 (three) times daily.        Probiotic Product (PROBIOTIC  DAILY PO) Take by mouth daily.        No current facility-administered medications for this visit.       No Known Allergies   Review of Systems:  Psychiatric/Behavioral:  Has anxiety or depression       Physical Exam:    Vitals:   04/27/21 1052  Weight: 69 lb 6 oz (31.5 kg)   Blood pressure 146/80, pulse 100/min  Gen: awake, alert, NAD HEENT: anicteric, no pallor CV: RRR, no mrg Pulm: CTA b/l Abd: soft, NT/ND, +BS throughout Rectal exam: In presence of [name] CMA.  Rectal vault more or less empty.  Small amount of soft stool-brown in color.  Heme-negative. Ext: no c/c/e Neuro: nonfocal  CBC Latest Ref Rng & Units 03/22/2021 04/07/2019 03/30/2019  WBC 4.0 - 10.5 K/uL 4.3 4.2 3.9(L)  Hemoglobin 12.0 - 15.0 g/dL 13.4 12.2 12.6  Hematocrit 36.0 - 46.0 % 40.4 37.7 38.5  Platelets 150 - 400 K/uL 302 326 268   CMP Latest Ref Rng & Units 03/22/2021 04/07/2019 03/30/2019  Glucose 70 - 99 mg/dL 92 95 96  BUN 6 - 20 mg/dL 8 <5(L) 5(L)  Creatinine 0.44 - 1.00 mg/dL 0.44 0.48 0.62  Sodium 135 - 145 mmol/L 132(L) 136 136  Potassium 3.5 - 5.1 mmol/L 4.3 3.4(L) 3.7  Chloride 98 - 111 mmol/L 93(L) 99 100  CO2 22 - 32 mmol/L 29 26 24   Calcium 8.9 - 10.3 mg/dL 10.0 9.5 9.9  Total Protein 6.5 - 8.1 g/dL 8.4(H) 7.8 7.9  Total Bilirubin 0.3 - 1.2 mg/dL 0.6 0.6 0.4  Alkaline Phos 38 - 126 U/L 61 51 52  AST 15 - 41 U/L 23 22 23   ALT 0 - 44 U/L 21 20 16        Carmell Austria, MD   Cc: Dr Delena Bali

## 2021-04-29 DIAGNOSIS — F411 Generalized anxiety disorder: Secondary | ICD-10-CM | POA: Diagnosis not present

## 2021-05-06 DIAGNOSIS — F411 Generalized anxiety disorder: Secondary | ICD-10-CM | POA: Diagnosis not present

## 2021-05-13 DIAGNOSIS — F411 Generalized anxiety disorder: Secondary | ICD-10-CM | POA: Diagnosis not present

## 2021-05-17 DIAGNOSIS — R079 Chest pain, unspecified: Secondary | ICD-10-CM | POA: Diagnosis not present

## 2021-05-17 DIAGNOSIS — G4489 Other headache syndrome: Secondary | ICD-10-CM | POA: Diagnosis not present

## 2021-05-17 DIAGNOSIS — Z743 Need for continuous supervision: Secondary | ICD-10-CM | POA: Diagnosis not present

## 2021-05-17 DIAGNOSIS — R000 Tachycardia, unspecified: Secondary | ICD-10-CM | POA: Diagnosis not present

## 2021-05-17 DIAGNOSIS — R0789 Other chest pain: Secondary | ICD-10-CM | POA: Diagnosis not present

## 2021-05-18 DIAGNOSIS — U071 COVID-19: Secondary | ICD-10-CM | POA: Diagnosis not present

## 2021-05-18 DIAGNOSIS — F411 Generalized anxiety disorder: Secondary | ICD-10-CM | POA: Diagnosis not present

## 2021-05-20 DIAGNOSIS — F411 Generalized anxiety disorder: Secondary | ICD-10-CM | POA: Diagnosis not present

## 2021-05-27 DIAGNOSIS — R69 Illness, unspecified: Secondary | ICD-10-CM | POA: Diagnosis not present

## 2021-05-28 DIAGNOSIS — R636 Underweight: Secondary | ICD-10-CM | POA: Diagnosis not present

## 2021-05-28 DIAGNOSIS — R531 Weakness: Secondary | ICD-10-CM | POA: Diagnosis not present

## 2021-05-28 DIAGNOSIS — U071 COVID-19: Secondary | ICD-10-CM | POA: Diagnosis not present

## 2021-05-28 DIAGNOSIS — E538 Deficiency of other specified B group vitamins: Secondary | ICD-10-CM | POA: Diagnosis not present

## 2021-05-28 DIAGNOSIS — M797 Fibromyalgia: Secondary | ICD-10-CM | POA: Diagnosis not present

## 2021-05-28 DIAGNOSIS — R69 Illness, unspecified: Secondary | ICD-10-CM | POA: Diagnosis not present

## 2021-05-28 DIAGNOSIS — Z681 Body mass index (BMI) 19 or less, adult: Secondary | ICD-10-CM | POA: Diagnosis not present

## 2021-06-03 DIAGNOSIS — R69 Illness, unspecified: Secondary | ICD-10-CM | POA: Diagnosis not present

## 2021-06-04 DIAGNOSIS — R531 Weakness: Secondary | ICD-10-CM | POA: Diagnosis not present

## 2021-06-04 DIAGNOSIS — D519 Vitamin B12 deficiency anemia, unspecified: Secondary | ICD-10-CM | POA: Diagnosis not present

## 2021-06-11 DIAGNOSIS — D519 Vitamin B12 deficiency anemia, unspecified: Secondary | ICD-10-CM | POA: Diagnosis not present

## 2021-06-18 DIAGNOSIS — D519 Vitamin B12 deficiency anemia, unspecified: Secondary | ICD-10-CM | POA: Diagnosis not present

## 2021-06-24 DIAGNOSIS — R69 Illness, unspecified: Secondary | ICD-10-CM | POA: Diagnosis not present

## 2021-07-01 DIAGNOSIS — R69 Illness, unspecified: Secondary | ICD-10-CM | POA: Diagnosis not present

## 2021-07-08 DIAGNOSIS — R69 Illness, unspecified: Secondary | ICD-10-CM | POA: Diagnosis not present

## 2021-07-15 DIAGNOSIS — R69 Illness, unspecified: Secondary | ICD-10-CM | POA: Diagnosis not present

## 2021-07-20 DIAGNOSIS — R531 Weakness: Secondary | ICD-10-CM | POA: Diagnosis not present

## 2021-07-20 DIAGNOSIS — R109 Unspecified abdominal pain: Secondary | ICD-10-CM | POA: Diagnosis not present

## 2021-07-20 DIAGNOSIS — R69 Illness, unspecified: Secondary | ICD-10-CM | POA: Diagnosis not present

## 2021-07-20 DIAGNOSIS — E559 Vitamin D deficiency, unspecified: Secondary | ICD-10-CM | POA: Diagnosis not present

## 2021-07-20 DIAGNOSIS — M797 Fibromyalgia: Secondary | ICD-10-CM | POA: Diagnosis not present

## 2021-07-20 DIAGNOSIS — E538 Deficiency of other specified B group vitamins: Secondary | ICD-10-CM | POA: Diagnosis not present

## 2021-07-20 DIAGNOSIS — R634 Abnormal weight loss: Secondary | ICD-10-CM | POA: Diagnosis not present

## 2021-07-22 ENCOUNTER — Telehealth: Payer: Self-pay | Admitting: Gastroenterology

## 2021-07-22 DIAGNOSIS — R69 Illness, unspecified: Secondary | ICD-10-CM | POA: Diagnosis not present

## 2021-07-22 NOTE — Telephone Encounter (Signed)
Patient called and wanted to inform  Dr. Lyndel Safe that she had started gaining weight finally and then on December 25th she got COVID. Stated that after she had COVID she lost more weight and is now having stomach cramps and smelling a poop smell all the time. Seeking advice if that is normal to smell that. Please advise.  ?

## 2021-07-23 DIAGNOSIS — R109 Unspecified abdominal pain: Secondary | ICD-10-CM | POA: Diagnosis not present

## 2021-07-23 NOTE — Telephone Encounter (Signed)
Pt stated that she recently had an office visit with Dr. Lyndel Safe as where she was feeling better, started eating more, gaining weight and then on Dec 25 pt stated that she got Covid, Lost appetite, lost some weight and recently started SMELLING POOP; pt states that she can just be sitting there and not passing gas and she smells poop. Pt states that this comes and goes and is not everyday but more days that not: Pt states that her BM's have a rotten egg smell and the smell of Suffer: ?Please advise: ? ?

## 2021-07-23 NOTE — Telephone Encounter (Signed)
Remo Lipps, ?Sometimes COVID does cause " slowing of colon" ?Lets get x-ray KUB 2V to make sure that she is not overtly constipated ?Increase water intake ?Must take boost  1-2/day.  Preferably 2/day ?RG ?

## 2021-07-24 ENCOUNTER — Other Ambulatory Visit: Payer: Self-pay

## 2021-07-24 DIAGNOSIS — K5909 Other constipation: Secondary | ICD-10-CM

## 2021-07-24 DIAGNOSIS — R109 Unspecified abdominal pain: Secondary | ICD-10-CM

## 2021-07-24 NOTE — Telephone Encounter (Signed)
Pt was notified of Dr. Lyndel Safe recommendations: Orders for xray placed in Epic: Pt made aware: Pt stated that she had a CT of the abdomen done  yesterday through her PCP at [facility]:  ?Pt verbalized understanding with all questions answered.  ? ?

## 2021-07-27 NOTE — Telephone Encounter (Signed)
I tried logging onto [facility] but cannot ?Can you please get the copy of the CT ?RG ?

## 2021-07-28 NOTE — Telephone Encounter (Signed)
Left a detailed message for the Radiology Department at [facility] to fax CT results to [phone]:  ?

## 2021-07-29 DIAGNOSIS — R69 Illness, unspecified: Secondary | ICD-10-CM | POA: Diagnosis not present

## 2021-07-29 NOTE — Telephone Encounter (Signed)
Fax NOT received from [facility]:  ?Spoke to [facility] Radiology Department: ?They stated that a request must be faxed to [phone] ?Request  faxed to [phone] requesting most recent CT scan  ?

## 2021-07-30 ENCOUNTER — Telehealth: Payer: Self-pay | Admitting: Gastroenterology

## 2021-07-30 NOTE — Telephone Encounter (Signed)
Inbound call from patient states she is still just smelling poop even without passing gas. States she did pick up kids probiotic, prebiotic and VIT C. Patient would like to know if this is okay to take  ?

## 2021-07-31 NOTE — Telephone Encounter (Signed)
Pt was notified that it is ok to proceed to take the probiotic, prebiotic and VIT C: ?Pt verbalized understanding with all questions answered.  ? ?

## 2021-07-31 NOTE — Telephone Encounter (Signed)
Fax received from [facility]. Fax sent to Dr. Lyndel Safe in [facility]: [phone] ?

## 2021-07-31 NOTE — Telephone Encounter (Signed)
Patient following up from yesterdays note, please advise.  ?

## 2021-08-06 ENCOUNTER — Other Ambulatory Visit: Payer: Self-pay | Admitting: Gastroenterology

## 2021-08-07 NOTE — Telephone Encounter (Signed)
Patient called requesting to speak to nurse has additional questions. ?

## 2021-08-10 NOTE — Telephone Encounter (Signed)
Pt states that she is still smelling poop and having issues with Constipation: Pt stated that she received a message via my chart stating that Dr. Lyndel Safe is moving from the [facility] Office: Pt requesting an office visit before Dr Christie [facility] as she states that she can not drive in [place]: ?Pt scheduled for an office visit on 09/04/2021 at 11:00 with Dr. Lyndel Safe: Pt made aware: Pt verbalized understanding with all questions answered.   ?

## 2021-08-17 DIAGNOSIS — D519 Vitamin B12 deficiency anemia, unspecified: Secondary | ICD-10-CM | POA: Diagnosis not present

## 2021-09-04 ENCOUNTER — Encounter: Payer: Self-pay | Admitting: Gastroenterology

## 2021-09-04 ENCOUNTER — Telehealth: Payer: Self-pay | Admitting: Gastroenterology

## 2021-09-04 ENCOUNTER — Ambulatory Visit (INDEPENDENT_AMBULATORY_CARE_PROVIDER_SITE_OTHER): Payer: Medicare Other | Admitting: Gastroenterology

## 2021-09-04 VITALS — BP 148/88 | HR 126 | Ht <= 58 in | Wt <= 1120 oz

## 2021-09-04 DIAGNOSIS — R109 Unspecified abdominal pain: Secondary | ICD-10-CM

## 2021-09-04 DIAGNOSIS — K5909 Other constipation: Secondary | ICD-10-CM | POA: Diagnosis not present

## 2021-09-04 DIAGNOSIS — K219 Gastro-esophageal reflux disease without esophagitis: Secondary | ICD-10-CM

## 2021-09-04 DIAGNOSIS — G8929 Other chronic pain: Secondary | ICD-10-CM

## 2021-09-04 DIAGNOSIS — Z1211 Encounter for screening for malignant neoplasm of colon: Secondary | ICD-10-CM

## 2021-09-04 NOTE — Telephone Encounter (Signed)
Please advise if you want me to order a cologuard for this patient or not? ?

## 2021-09-04 NOTE — Patient Instructions (Signed)
If you are age 59 or older, your body mass index should be between 23-30. Your Body mass index is 15.69 kg/m?Marland Kitchen If this is out of the aforementioned range listed, please consider follow up with your Primary Care Provider. ? ?If you are age 78 or younger, your body mass index should be between 19-25. Your Body mass index is 15.69 kg/m?Marland Kitchen If this is out of the aformentioned range listed, please consider follow up with your Primary Care Provider.  ? ?________________________________________________________ ? ?The [facility] providers would like to encourage you to use [facility] to communicate with providers for non-urgent requests or questions.  Due to long hold times on the telephone, sending your provider a message by [facility] may be a faster and more efficient way to get a response.  Please allow 48 business hours for a response.  Please remember that this is for non-urgent requests.  ?_______________________________________________________ ? ?Please increase water intake ? ?Small but frequent meals ? ?Please call with any questions or concerns. ? ?Thank you, ? ?Dr. Jackquline Denmark ? ? ? ? ? ?We want to thank you for trusting [facility] with your care. All of our staff and providers value the relationships we have built with our patients, and it is an honor to care for you.  ? ?We are writing to let you know that [facility] [place] will close on Oct 05, 2021, and we invite you to continue to see Dr. Carmell Austria and Gerrit Heck at the [facility] [place] office location. We are consolidating our serices at these [facility] practices to better provide care. Our office staff will work with you to ensure a seamless transition.  ? ?Gerrit Heck, DO -Dr. Bryan Lemma will be movig to [facility] at [address] [place], [place] [address], effective Oct 05, 2021.  Contact [phone] to schedule an appointment with him.  ? ?Carmell Austria, MD- Dr. Lyndel Safe will be  movig to [facility] at [address] [place], [place] [address], effective Oct 05, 2021.  Contact [phone] to schedule an appointment with him.  ? ?Requesting Medical Records ?If you need to request your medical records, please follow the instructions below. Your medical records are confidential, and a copy can be transferred to another provider or released to you or another person you designate only with your permission. ? ?There are several ways to request your medical records: ?Requests for medical records can be submitted through our practice.   ?You can also request your records electronically, in your MyChart account by selecting the ?Request Health Records? tab.  ?If you need additional information on how to request records, please go to [email or web address]/, choose Patient Information, then select Request Medical Records. ?To make an appointment or if you have any questions about your health care needs, please contact our office at [phone] and one of our staff members will be glad to assist you. ?[facility] is committed to providing exceptional care for you and our community. Thank you for allowing [place] to serve your health care needs. ?Sincerely, ? ?Windy Canny, Director [facility] ? also offers convenient virtual care options. Sore throat? Sinus problems? Cold or flu symptoms? Get care from the comfort of home with [facility] Video Visits and e-Visits. Learn more about the non-emergency conditions treated and start your virtual visit at [email or web address] ? ?

## 2021-09-04 NOTE — Telephone Encounter (Signed)
Inbound call from patient stating that she would like a colourguard test ordered for her. Please advise. ?

## 2021-09-04 NOTE — Progress Notes (Signed)
?  ?  ?Chief Complaint: FU ?  ?Referring Provider:  Dr Delena Bali    ?  ?  ?ASSESSMENT AND PLAN;  ?  ?#1. IBS-C. H/O fecal impaction s/p manual disimpaction 05/2020.  Subsequent rectal exams neg.  Stool soft heme-neg. Neg stool studies, cologuard test 04/2017, virtual colon 11/2017. Failed bentyl, amitriptyline. Refuses colon. ? ?#2. Chronic abdo/pelvic pain (since hysterectomy 04/2016). Dx with IC by urology, fibromyalgia/anxiety/depression/panic attacks/conversion disorder/pseudoseizures. Multiple ED visits. Multiple neg CT scans A/P- 07/2021, 03/28/2019, 03/25/2019, 02/14/2019, 12/2016, 07/2017, 12/2018.  ?  ?#3. GERD with neg Ba Swallow 04/2018.  Neg EGD 04/24/2019, 02/2020. Failed carafate, GI coctail, pepcid, protonix, omeprazole, nexium (caused burning) ("can't do PPIs").  ? ?#4. SIGNIFICANT ANXIETY/DEPRESSION/PANIC DISORDER/CONVERSION DISORDER/PTSD ? ?#5.  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 ? ? ? ?Plan:  ? ?-Increase water intake. ?-Small but more frequent meals.  ?-Reassured pt ?-No GI intervention planned at this time. ?-I have gone over recent CT Abdo/pelvis with contrast 07/2021 with the patient in detail.  I also given her copy of the CT. ? ? ?HPI:   ?  ?Donna Francis is a 61 y.o. female  ?With fibromyalgia, anxiety/depression, costochondritis ? ?For follow-up visit. ? ?Can only eat chicken, seafood, greens, rice, fruits like berries. Chobani yogburt. Couldnot handle boost/ensure. ? ?Eating 5-6 meals a day. ? ?Covid 04/2021, then "long covid" ? ?CT AP with contrast 07/2021 neg  ? ?Bms - now 1/day, still constipated, but has to push. After hystrectomy and EGD ? ?Had kenolog shot and then started having more GI problems -more reflux, postprandial abdominal pain.  Getting better with Mylanta plus lidocaine.  Currently not taking any medications.  Did have decreased appetite and had lost weight as below. ? ?Constipation is better. ? ?At times would have crampy nonspecific abdominal pain  attributed to spasms.  These do get better with defecation. ? ? ?Wt Readings from Last 3 Encounters:  ?09/04/21 70 lb (31.8 kg)  ?04/27/21 69 lb 6 oz (31.5 kg)  ?03/22/21 73 lb (33.1 kg)  ? ?Her cardiology work-up has been neg. ? ?She has done well with the behavioral therapy. ? ? ?Daughter with ETOH pancreatitis in [facility]. ?Sister has IBS at [place] ? ?From previous records: ? ?-occ dysphagia. S/P EGD by Dr M Oct 2021 at [facility] while inpt. Has problems waking up. Thereafter, had dysphagia (with solids and liquids) and odynophagia and abdo pain d/t spasms. Convinced that most of problems are related to recent EGD ? ?-Chronic abd pain with associated nausea but no vomiting, feeling dizzy, weakness, occasional shortness of breath, fatigue, headaches, palpitations, back pain, belching, unexplained weight loss, "a lot of gas" ? ?-Can only eat chicken, protein shake, 1 pediasure.  Tolerating small meals.  ? ?-S/p EGD 04/24/2019 showing mild gastritis.  Biopsies were negative for H. Pylori.  SB Bx- neg for celiac disease.  Intolerance to multiple PPIs. ? ?-She is under care of [name] -psychologist in [place].  She really likes her.  Situational anxiety is getting under control. ? ?-All her symptoms started after she had a hysterectomy 04/2016.  She has been seen by GYN at [facility].  I have reviewed the notes in care everywhere.  It has been recommended to start pelvic physical therapy.  ? ?-Refuses colonoscopy. ? ?    ?Past Medical History:  ?Diagnosis Date  ? Anxiety    ? Colitis    ? IBS (irritable bowel syndrome)    ? Varicose veins of bilateral lower extremities  with pain    ?  ?  ?     ?Past Surgical History:  ?Procedure Laterality Date  ? ABDOMINAL HYSTERECTOMY   04/2016  ? CESAREAN SECTION      ?  x 4  ? COLONOSCOPY   1995  ?  in [place]  ?  ?  ?     ?Family History  ?Problem Relation Age of Onset  ? Pancreatic cancer Mother    ?  ?  ?Social History  ?  ?     ?Tobacco Use  ? Smoking status: Never Smoker  ? Smokeless  tobacco: Never Used  ?Substance Use Topics  ? Alcohol use: Yes  ?    Comment: minimal  ? Drug use: No  ?  ?  ?      ?Current Outpatient Medications  ?Medication Sig Dispense Refill  ? ALPRAZolam (XANAX) 0.5 MG tablet Take 1 mg by mouth 4 times       ? Probiotic Product (PROBIOTIC DAILY PO) Take by mouth daily.      ?  ?No current facility-administered medications for this visit.   ?  ?  ?No Known Allergies ?  ?Review of Systems:  ?Psychiatric/Behavioral:  Has anxiety or depression ?  ?  ?  ?Physical Exam:   ? ?Vitals:  ? 09/04/21 1105  ?Weight: 70 lb (31.8 kg)  ?Height: '4\' 8"'$  (1.422 m)  ? ?Blood pressure 146/80, pulse 100/min ? ?Gen: awake, alert, NAD ?HEENT: anicteric, no pallor ?CV: RRR, no mrg ?Pulm: CTA b/l ?Abd: soft, NT/ND, +BS throughout ?Rectal exam: In presence of [name] CMA.  Rectal vault more or less empty.  Small amount of soft stool-brown in color.  Heme-negative. ?Ext: no c/c/e ?Neuro: nonfocal ? ? ?  Latest Ref Rng & Units 03/22/2021  ?  2:26 AM 04/07/2019  ?  6:07 PM 03/30/2019  ?  3:17 PM  ?CBC  ?WBC 4.0 - 10.5 K/uL 4.3   4.2   3.9    ?Hemoglobin 12.0 - 15.0 g/dL 13.4   12.2   12.6    ?Hematocrit 36.0 - 46.0 % 40.4   37.7   38.5    ?Platelets 150 - 400 K/uL 302   326   268    ? ? ?  Latest Ref Rng & Units 03/22/2021  ?  2:26 AM 04/07/2019  ?  6:07 PM 03/30/2019  ?  3:17 PM  ?CMP  ?Glucose 70 - 99 mg/dL 92   95   96    ?BUN 6 - 20 mg/dL 8   <5   5    ?Creatinine 0.44 - 1.00 mg/dL 0.44   0.48   0.62    ?Sodium 135 - 145 mmol/L 132   136   136    ?Potassium 3.5 - 5.1 mmol/L 4.3   3.4   3.7    ?Chloride 98 - 111 mmol/L 93   99   100    ?CO2 22 - 32 mmol/L '29   26   24    '$ ?Calcium 8.9 - 10.3 mg/dL 10.0   9.5   9.9    ?Total Protein 6.5 - 8.1 g/dL 8.4   7.8   7.9    ?Total Bilirubin 0.3 - 1.2 mg/dL 0.6   0.6   0.4    ?Alkaline Phos 38 - 126 U/L 61   51   52    ?AST 15 - 41 U/L '23   22   23    '$ ?  ALT 0 - 44 U/L '21   20   16    '$ ? ? ? ?  ?Carmell Austria, MD ?  ?Cc: Dr Delena Bali ? ?

## 2021-09-08 NOTE — Telephone Encounter (Signed)
Patient stated that she still have the poop smell. It is continuous but when she farts it isn't as bad. Please advise. Order placed. Patient said if she is positive she will not do colonoscopy  ?

## 2021-09-08 NOTE — Telephone Encounter (Signed)
Go ahead and order Cologuard ?Please let her know, if Cologuard is positive, she would need colonoscopy ?RG ?

## 2021-09-25 LAB — COLOGUARD: COLOGUARD: NEGATIVE

## 2021-10-05 ENCOUNTER — Telehealth: Payer: Self-pay | Admitting: Gastroenterology

## 2021-10-05 NOTE — Telephone Encounter (Signed)
We received a call from patient. Per patient, was seen yesterday in the ED at [facility] for gastritis. Patient states that [facility] faxed over records to "[name]'s fax." Patient wants to know if we suggest carafate or protonix for gastritis? Please advise.  ?

## 2021-10-06 ENCOUNTER — Other Ambulatory Visit: Payer: Self-pay

## 2021-10-06 NOTE — Telephone Encounter (Signed)
Pt stated that she was recently in the ED  and they told her she had gastritis. Pt stated that the prescribed her Carafate. Pt questioned if she should take the medication. Pt was notified that she should take the medication that the providers in the ED prescribed to her after there assessment and evaluation. Pt stated that she started to have back pain and is currently in the Doctors office and they are thinking that she may have an UTI. ?Pt verbalized understanding with all questions answered. ' ?

## 2021-10-07 ENCOUNTER — Telehealth: Payer: Self-pay | Admitting: Gastroenterology

## 2021-10-07 NOTE — Telephone Encounter (Signed)
Noted  

## 2021-10-07 NOTE — Telephone Encounter (Signed)
Phone note ? ?- Seen in ED 10/04/2021 at [facility] with abdo pain, back pain, N/V, dizziness ?-Exam was unremarkable. ?-Labs showed hemoglobin 11.6, MCV 90.  AST 48, ALT 41 (previously 23/13 March 2021, and also recently were normal.) ?-Seen by Laverna Peace, may have UTI.  UA/culture is pending ?-Any plans to repeat LFTs in 2 weeks.  If still elevated, will perform further work-up. ? ?I have reviewed previous CT- Nl CT ? ?Can take carafate on as needed basis. ? ?RG ? ?

## 2021-10-29 ENCOUNTER — Telehealth: Payer: Medicare Other | Admitting: Gastroenterology

## 2021-10-29 ENCOUNTER — Other Ambulatory Visit: Payer: Self-pay

## 2021-10-29 ENCOUNTER — Encounter (HOSPITAL_COMMUNITY): Payer: Self-pay | Admitting: Oncology

## 2021-10-29 ENCOUNTER — Emergency Department (HOSPITAL_COMMUNITY)
Admission: EM | Admit: 2021-10-29 | Discharge: 2021-10-29 | Disposition: A | Discharge status: Home / Self Care | Payer: Medicare Other | Attending: Emergency Medicine | Admitting: Emergency Medicine

## 2021-10-29 ENCOUNTER — Emergency Department (HOSPITAL_COMMUNITY): Payer: Medicare Other

## 2021-10-29 ENCOUNTER — Telehealth: Payer: Self-pay | Admitting: Gastroenterology

## 2021-10-29 DIAGNOSIS — K5900 Constipation, unspecified: Secondary | ICD-10-CM | POA: Insufficient documentation

## 2021-10-29 DIAGNOSIS — R1084 Generalized abdominal pain: Secondary | ICD-10-CM | POA: Diagnosis not present

## 2021-10-29 DIAGNOSIS — F419 Anxiety disorder, unspecified: Secondary | ICD-10-CM | POA: Insufficient documentation

## 2021-10-29 DIAGNOSIS — R072 Precordial pain: Secondary | ICD-10-CM | POA: Insufficient documentation

## 2021-10-29 DIAGNOSIS — R000 Tachycardia, unspecified: Secondary | ICD-10-CM | POA: Insufficient documentation

## 2021-10-29 DIAGNOSIS — R1013 Epigastric pain: Secondary | ICD-10-CM | POA: Insufficient documentation

## 2021-10-29 DIAGNOSIS — R110 Nausea: Secondary | ICD-10-CM | POA: Insufficient documentation

## 2021-10-29 LAB — CBC WITH DIFFERENTIAL/PLATELET
Abs Immature Granulocytes: 0.01 10*3/uL (ref 0.00–0.07)
Basophils Absolute: 0 10*3/uL (ref 0.0–0.1)
Basophils Relative: 1 %
Eosinophils Absolute: 0 10*3/uL (ref 0.0–0.5)
Eosinophils Relative: 1 %
HCT: 36.5 % (ref 36.0–46.0)
Hemoglobin: 11.8 g/dL — ABNORMAL LOW (ref 12.0–15.0)
Immature Granulocytes: 0 %
Lymphocytes Relative: 26 %
Lymphs Abs: 1.1 10*3/uL (ref 0.7–4.0)
MCH: 30.4 pg (ref 26.0–34.0)
MCHC: 32.3 g/dL (ref 30.0–36.0)
MCV: 94.1 fL (ref 80.0–100.0)
Monocytes Absolute: 0.4 10*3/uL (ref 0.1–1.0)
Monocytes Relative: 10 %
Neutro Abs: 2.8 10*3/uL (ref 1.7–7.7)
Neutrophils Relative %: 62 %
Platelets: 263 10*3/uL (ref 150–400)
RBC: 3.88 MIL/uL (ref 3.87–5.11)
RDW: 11.6 % (ref 11.5–15.5)
WBC: 4.4 10*3/uL (ref 4.0–10.5)
nRBC: 0 % (ref 0.0–0.2)

## 2021-10-29 LAB — COMPREHENSIVE METABOLIC PANEL
ALT: 25 U/L (ref 0–44)
AST: 23 U/L (ref 15–41)
Albumin: 3.7 g/dL (ref 3.5–5.0)
Alkaline Phosphatase: 57 U/L (ref 38–126)
Anion gap: 7 (ref 5–15)
BUN: 8 mg/dL (ref 6–20)
CO2: 25 mmol/L (ref 22–32)
Calcium: 8.8 mg/dL — ABNORMAL LOW (ref 8.9–10.3)
Chloride: 107 mmol/L (ref 98–111)
Creatinine, Ser: 0.48 mg/dL (ref 0.44–1.00)
GFR, Estimated: >60 mL/min (ref >60)
Glucose, Bld: 77 mg/dL (ref 70–99)
Potassium: 3.7 mmol/L (ref 3.5–5.1)
Sodium: 139 mmol/L (ref 135–145)
Total Bilirubin: 0.6 mg/dL (ref 0.3–1.2)
Total Protein: 7.2 g/dL (ref 6.5–8.1)

## 2021-10-29 LAB — TROPONIN I (HIGH SENSITIVITY): Troponin I (High Sensitivity): 2 ng/L (ref <18)

## 2021-10-29 LAB — LIPASE, BLOOD: Lipase: 34 U/L (ref 11–51)

## 2021-10-29 MED ORDER — ONDANSETRON HCL 4 MG/2ML IJ SOLN
4.0000 mg | Freq: Once | INTRAMUSCULAR | Status: DC
  Filled 2021-10-29: qty 2

## 2021-10-29 MED ORDER — PANTOPRAZOLE SODIUM 40 MG IV SOLR: 40.0000 mg | Freq: Once | INTRAVENOUS | Status: DC

## 2021-10-29 MED ORDER — LIDOCAINE VISCOUS HCL 2 % MT SOLN
15.0000 mL | Freq: Once | OROMUCOSAL | Status: AC
  Administered 2021-10-29: 15 mL via ORAL
  Filled 2021-10-29: qty 15

## 2021-10-29 MED ORDER — SODIUM CHLORIDE (PF) 0.9 % IJ SOLN
INTRAMUSCULAR | Status: AC
  Filled 2021-10-29: qty 50

## 2021-10-29 MED ORDER — ALUM & MAG HYDROXIDE-SIMETH 200-200-20 MG/5ML PO SUSP
30.0000 mL | Freq: Once | ORAL | Status: AC
  Administered 2021-10-29: 30 mL via ORAL
  Filled 2021-10-29: qty 30

## 2021-10-29 MED ORDER — IOHEXOL 300 MG/ML  SOLN
60.0000 mL | Freq: Once | INTRAMUSCULAR | Status: AC | PRN
  Administered 2021-10-29: 60 mL via INTRAVENOUS

## 2021-10-29 MED ORDER — SODIUM CHLORIDE 0.9 % IV BOLUS: 500.0000 mL | Freq: Once | INTRAVENOUS | Status: DC

## 2021-10-29 NOTE — Telephone Encounter (Signed)
Patient called in distress.  "Something wrong is going on with me."  Patient is experiencing the following symptoms:  extreme pain in her back, stomach, and chest; raspy voice; fingernails and toenails are purple; pain in left leg and hips; dizzy; fog brain; nausea; SOB; feels weird; has taken homemade GI cocktail but it makes her feel worse; and is cold and shivering.  She has gone to her PCP and he suggested for her to see Dr. Lyndel Safe.  She would like to know what she needs to do and whether or not she should go to the ER.  Please call patient and advise.  Thank you.

## 2021-10-29 NOTE — ED Triage Notes (Signed)
Pt w/ c/o chest pain and abdominal pain.  Hx of gastroparesis.

## 2021-10-29 NOTE — Discharge Instructions (Addendum)
Please follow-up with your doctor for further evaluation and management of your condition.  Fortunately today your labs, chest x-ray, and CT scan of the abdomen pelvis did not show any acute concerning pathology that would require hospital admission or surgery.

## 2021-10-29 NOTE — Telephone Encounter (Signed)
ERROR - INFORMATION IN WRONG CHART.

## 2021-10-29 NOTE — ED Triage Notes (Signed)
Pt. BIB EMS from home. Pt. C/o anxiety and abdominal pain.

## 2021-10-29 NOTE — ED Provider Notes (Addendum)
[facility] DEPT Provider Note   CSN: [id number] Arrival date & time: 10/29/21  1248     History  No chief complaint on file.   Donna Francis is a 61 y.o. female.  Patient with history of hysterectomy, gastritis, anxiety, IBS with constipation, fibromyalgia -- presents to the emergency department for a plethora of different complaints.  She states that over the past 2 days her symptoms have been worse, however these have apparently been going on for a long time.  Her most pressing concern is abdominal pain which comes and goes in waves.  She relates this to what her previous gastritis felt like, however currently symptoms are more severe with radiation to the back and burning pain into her chest with associated nausea.  She denies fever or URI symptoms.  She denies shortness of breath or difficulty breathing.  Sometimes she has constipation and sometimes diarrhea, but it does not sound as though she has had any acute changes.  No irritative UTI symptoms voiced.  Pain is central abdomen.  She states that she called her GI physician today in their office recommend that she come to the emergency department (their note is in [place]).  Patient states that she just feels very strange, to the point where she called family yesterday in [place] in case this was the end.       Home Medications Prior to Admission medications   Medication Sig Start Date End Date Taking? Authorizing Provider  acetaminophen (TYLENOL) 500 MG tablet Take 500 mg by mouth 3 (three) times daily.    [provider]  ALPRAZolam Duanne Moron) 1 MG tablet Take 1 mg by mouth 4 (four) times daily.    [provider]  Alum & Mag Hydroxide-Simeth (MYLANTA PO) Take 1 tablet by mouth as needed.    [provider]  lidocaine (XYLOCAINE) 2 % solution Use as directed 15 mLs in the mouth or throat as needed for mouth pain.    [provider]  loratadine (CLARITIN) 5 MG  chewable tablet Chew 10 mg by mouth daily.    [provider]  montelukast (SINGULAIR) 5 MG chewable tablet Chew 5 mg by mouth daily. 11/13/20   [provider]  ondansetron (ZOFRAN-ODT) 4 MG disintegrating tablet DISSOLVE 1 TABLET IN MOUTH EVERY 6 HOURS AS NEEDED FOR NAUSEA FOR VOMITING 08/07/21   Jackquline Denmark, MD      Allergies    Propofol and Proton pump inhibitors    Review of Systems   Review of Systems  Physical Exam Updated Vital Signs There were no vitals taken for this visit. Physical Exam Vitals and nursing note reviewed.  Constitutional:      General: She is not in acute distress.    Appearance: She is well-developed.     Comments: Patient is very thin  HENT:     Head: Normocephalic and atraumatic.     Right Ear: External ear normal.     Left Ear: External ear normal.     Nose: Nose normal.  Eyes:     Conjunctiva/sclera: Conjunctivae normal.  Cardiovascular:     Rate and Rhythm: Regular rhythm. Tachycardia present.     Heart sounds: No murmur heard. Pulmonary:     Effort: No respiratory distress.     Breath sounds: No wheezing, rhonchi or rales.  Abdominal:     Palpations: Abdomen is soft.     Tenderness: There is abdominal tenderness. There is no guarding or rebound.  Comments: Mild generalized abdominal tenderness.  No rebound or guarding.  Musculoskeletal:     Cervical back: Normal range of motion and neck supple.     Right lower leg: No edema.     Left lower leg: No edema.  Skin:    General: Skin is warm and dry.     Findings: No rash.  Neurological:     General: No focal deficit present.     Mental Status: She is alert. Mental status is at baseline.     Motor: No weakness.  Psychiatric:        Mood and Affect: Mood is anxious.        Speech: Speech is tangential.        Behavior: Behavior normal.     ED Results / Procedures / Treatments   Labs (all labs ordered are listed, but only abnormal results are displayed) Labs  Reviewed - No data to display  EKG None  Radiology No results found.  Procedures Procedures    Medications Ordered in ED Medications - No data to display  ED Course/ Medical Decision Making/ A&P    Patient seen and examined. History obtained directly from patient.  I also reviewed previous PCP notes.  Patient has multiple different problems and is very difficult to determine what is acute versus chronic.  She gives a very meandering history regarding her symptoms and work-ups, hospitalizations.  It sounds like she has had an endoscopy in the past 2 years.  Given abdominal pain with radiation to the chest, will evaluate for potential cardiac and abdominal causes today.  Will obtain CT imaging of the abdomen pelvis.  We will give IV fluid bolus, Zofran and Protonix given gastritis history.  Labs/EKG: Ordered CBC, CMP, lipase, troponin, UA.  Imaging: Ordered CT abdomen pelvis.  Medications/Fluids: Ordered: Fluid bolus, Protonix, Zofran  Most recent vital signs reviewed and are as follows: BP (!) 157/96 (BP Location: Right Arm)   Pulse (!) 118   Temp 99 F (37.2 C) (Oral)   Resp (!) 21   LMP 12/23/2018 (Approximate)   SpO2 99%   Initial impression: Acute on chronic abdominal pain, chest burning  2:19 PM Of note, patient was initially registered incorrectly. Charting re-done and labs/imaging reordered.   3:13 PM chest x-ray personally reviewed and interpreted.  Agree no acute findings.  At time of shift change, patient discussed with Haywood Pao.  Current plan is to follow-up on labs and imaging of the abdomen and pelvis.  Dispo pending results.                           Medical Decision Making Amount and/or Complexity of Data Reviewed Labs: ordered. Radiology: ordered.   For this patient's complaint of abdominal pain, the following conditions were considered on the differential diagnosis: gastritis/PUD, enteritis/duodenitis, appendicitis, cholelithiasis/cholecystitis,  cholangitis, pancreatitis, ruptured viscus, colitis, diverticulitis, small/large bowel obstruction, proctitis, cystitis, pyelonephritis, ureteral colic, aortic dissection, aortic aneurysm. In women, ectopic pregnancy, pelvic inflammatory disease, ovarian cysts, and tubo-ovarian abscess were also considered. Atypical chest etiologies were also considered including ACS, PE, and pneumonia.          Final Clinical Impression(s) / ED Diagnoses Final diagnoses:  Epigastric pain  Precordial pain    Rx / DC Orders ED Discharge Orders     None         Carlisle Cater, PA-C 10/29/21 [place], DO 10/30/21 [phone]

## 2021-10-29 NOTE — Telephone Encounter (Signed)
Pt was notified to go to to the ED for treatment and evaluation off of the symptoms that pt stated over the phone with the scheduler: Pt verbalized understanding with all questions answered.

## 2021-10-29 NOTE — ED Notes (Signed)
Initial chart created under wrong pt. Transferring information into correct chart at this time. Pt arrived to ED at 1249. Given 500 ml NS bolus at 1414, given 4 mg zofran at 1413.

## 2021-10-29 NOTE — ED Notes (Signed)
Chart created on wrong pt. Please disregard all information in this encounter.

## 2021-10-29 NOTE — ED Provider Notes (Signed)
Received signout from previous provider, please see his note for complete H&P.  This is a 61 year old female significant history of anxiety, and fibromyalgia who presents with multiple complaints.  Her primary complaint is abdominal pain similar to her prior gastritis but more severe.  During her ER visit patient was given IV fluid, Protonix, and Zofran.  Labs and imaging was obtained independently viewed interpreted by me and I agree with radiologist interpretation.  Fortunately her labs are reassuring, normal WBC, normal H&H, electrolyte panels are within normal limit, normal lipase, normal troponin, an abdominal pelvis CT scan obtained and show no acute abnormalities.  X-ray of the chest obtained and unremarkable.  I discussed this finding with patient.  It appears her symptom has been ongoing for more than a month.  I felt patient certainly can follow-up with her primary care doctor or with a GI specialist for further managements of her condition.  Return precaution given.  Patient otherwise stable for discharge.  BP 137/83   Pulse 90   Temp 99 F (37.2 C) (Oral)   Resp 13   SpO2 93%   Results for orders placed or performed during the hospital encounter of 10/29/21  CBC with Differential  Result Value Ref Range   WBC 4.4 4.0 - 10.5 K/uL   RBC 3.88 3.87 - 5.11 MIL/uL   Hemoglobin 11.8 (L) 12.0 - 15.0 g/dL   HCT 36.5 36.0 - 46.0 %   MCV 94.1 80.0 - 100.0 fL   MCH 30.4 26.0 - 34.0 pg   MCHC 32.3 30.0 - 36.0 g/dL   RDW 11.6 11.5 - 15.5 %   Platelets 263 150 - 400 K/uL   nRBC 0.0 0.0 - 0.2 %   Neutrophils Relative % 62 %   Neutro Abs 2.8 1.7 - 7.7 K/uL   Lymphocytes Relative 26 %   Lymphs Abs 1.1 0.7 - 4.0 K/uL   Monocytes Relative 10 %   Monocytes Absolute 0.4 0.1 - 1.0 K/uL   Eosinophils Relative 1 %   Eosinophils Absolute 0.0 0.0 - 0.5 K/uL   Basophils Relative 1 %   Basophils Absolute 0.0 0.0 - 0.1 K/uL   Immature Granulocytes 0 %   Abs Immature Granulocytes 0.01 0.00 - 0.07  K/uL  Comprehensive metabolic panel  Result Value Ref Range   Sodium 139 135 - 145 mmol/L   Potassium 3.7 3.5 - 5.1 mmol/L   Chloride 107 98 - 111 mmol/L   CO2 25 22 - 32 mmol/L   Glucose, Bld 77 70 - 99 mg/dL   BUN 8 6 - 20 mg/dL   Creatinine, Ser 0.48 0.44 - 1.00 mg/dL   Calcium 8.8 (L) 8.9 - 10.3 mg/dL   Total Protein 7.2 6.5 - 8.1 g/dL   Albumin 3.7 3.5 - 5.0 g/dL   AST 23 15 - 41 U/L   ALT 25 0 - 44 U/L   Alkaline Phosphatase 57 38 - 126 U/L   Total Bilirubin 0.6 0.3 - 1.2 mg/dL   GFR, Estimated >60 >60 mL/min   Anion gap 7 5 - 15  Lipase, blood  Result Value Ref Range   Lipase 34 11 - 51 U/L  Troponin I (High Sensitivity)  Result Value Ref Range   Troponin I (High Sensitivity) 2 <18 ng/L   CT ABDOMEN PELVIS W CONTRAST  Result Date: 10/29/2021 CLINICAL DATA:  Intermittent upper abdominal pain. EXAM: CT ABDOMEN AND PELVIS WITH CONTRAST TECHNIQUE: Multidetector CT imaging of the abdomen and pelvis was performed using  the standard protocol following bolus administration of intravenous contrast. RADIATION DOSE REDUCTION: This exam was performed according to the departmental dose-optimization program which includes automated exposure control, adjustment of the mA and/or kV according to patient size and/or use of iterative reconstruction technique. CONTRAST:  75m OMNIPAQUE IOHEXOL 300 MG/ML  SOLN COMPARISON:  Multiple priors including most recent CT July 23, 2021 FINDINGS: Lower chest: No acute abnormality. Hepatobiliary: No suspicious hepatic lesion. Gallbladder is unremarkable. No biliary ductal dilation. Pancreas: No pancreatic ductal dilation or evidence of acute inflammation. Spleen: No splenomegaly or focal splenic lesion. Adrenals/Urinary Tract: Bilateral adrenal glands appear normal. No hydronephrosis. Kidneys demonstrate symmetric enhancement and excretion of contrast material. Benign 8 mm right interpolar renal sinus cyst requires no imaging follow-up. Urinary bladder is  unremarkable for degree of distension. Stomach/Bowel: No radiopaque enteric contrast material was identified. Stomach is unremarkable for degree of distension. Pathologic dilation of small or large bowel. No evidence of acute bowel inflammation. Appendix is not confidently identified however there is no pericecal inflammation. Colonic diverticulosis without findings of acute diverticulitis. Vascular/Lymphatic: Scattered aortic atherosclerosis without abdominal aortic aneurysm. No pathologically enlarged abdominal or pelvic lymph nodes. Reproductive: Status post hysterectomy. No adnexal masses. Other: No significant abdominopelvic free fluid. Musculoskeletal: No acute osseous abnormality. IMPRESSION: No acute abnormality identified in the abdomen or pelvis. Electronically Signed   By: JDahlia BailiffM.D.   On: 10/29/2021 16:28   DG Chest 2 View  Result Date: 10/29/2021 CLINICAL DATA:  Chest pain and abdominal pain. EXAM: CHEST - 2 VIEW COMPARISON:  02/22/2021 FINDINGS: Artifact overlies the chest. Heart size is normal. Mediastinal shadows are normal. The lungs are clear. No effusions. The vascularity is normal. No abnormal bone finding. IMPRESSION: No active cardiopulmonary disease. Electronically Signed   By: MNelson ChimesM.D.   On: 10/29/2021 14:55       TDomenic Moras PA-C 10/29/21 1745    BMalvin Johns MD 10/29/21 [phone]

## 2021-10-30 NOTE — Telephone Encounter (Signed)
Patient called states she was sent to the ED yesterday and she went bt nothing was done. She said they did a CT scan and an xray but they do not know what is causing her symptoms and chronic pain. She is wondering why she was advised to go to the ED and she is seeking further advise. She is in desperate need of a treatment plan does not know what to do.

## 2021-11-01 IMAGING — DX DG ABDOMEN 1V
1 series · 1 of 1 positions shown · non-contrast
Comparison: Ultrasound abdomen 03/08/2018

CLINICAL DATA: Constipation, history of impaction.

EXAM:
ABDOMEN - 1 VIEW

[abdomen kub]
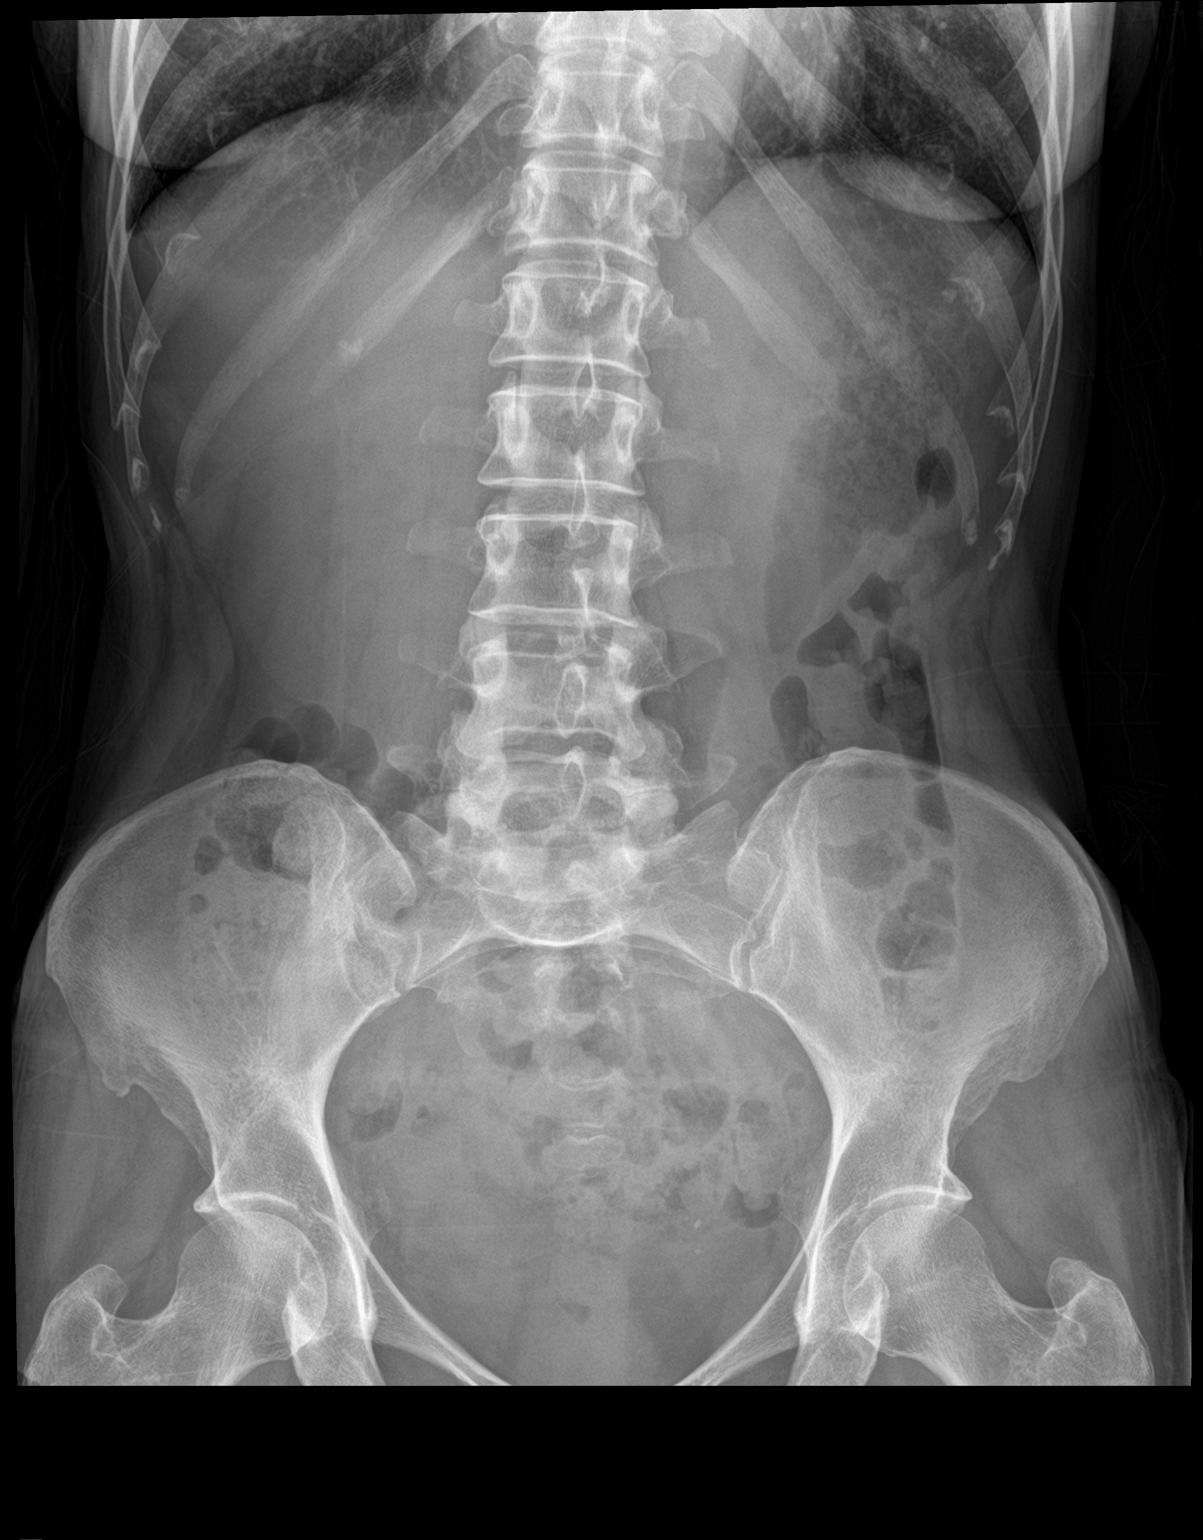

[1 of 1 positions shown; findings below may reference images not displayed]

FINDINGS: The bowel gas pattern is normal. No radio-opaque calculi or other
significant radiographic abnormality are seen.

Five non-rib-bearing lumbar vertebral bodies are noted. Osseous
structures are grossly unremarkable.
IMPRESSION: Negative.

## 2021-11-02 NOTE — Telephone Encounter (Signed)
Pt stated that she went to the ED as was instructed and they did nothing for her. Pt stated that the is still having pain in her stomach, chest, back. Pt was scheduled an office visit with Dr. Lyndel Safe on July 10 at 11:20 AM . Address provided. Pt verbalized understanding with all questions answered.

## 2021-11-30 ENCOUNTER — Ambulatory Visit (INDEPENDENT_AMBULATORY_CARE_PROVIDER_SITE_OTHER): Payer: Medicare Other | Admitting: Gastroenterology

## 2021-11-30 VITALS — BP 128/88 | HR 120 | Ht <= 58 in | Wt 72.1 lb

## 2021-11-30 DIAGNOSIS — K581 Irritable bowel syndrome with constipation: Secondary | ICD-10-CM | POA: Diagnosis not present

## 2021-11-30 NOTE — Patient Instructions (Signed)
If you are age 59 or older, your body mass index should be between 23-30. Your Body mass index is 16.17 kg/m. If this is out of the aforementioned range listed, please consider follow up with your Primary Care Provider.  If you are age 60 or younger, your body mass index should be between 19-25. Your Body mass index is 16.17 kg/m. If this is out of the aformentioned range listed, please consider follow up with your Primary Care Provider.   ________________________________________________________  The [facility] providers would like to encourage you to use [facility] to communicate with providers for non-urgent requests or questions.  Due to long hold times on the telephone, sending your provider a message by [facility] may be a faster and more efficient way to get a response.  Please allow 48 business hours for a response.  Please remember that this is for non-urgent requests.  _______________________________________________________  Follow up as needed.  Thank you,  Dr. Jackquline Denmark

## 2021-11-30 NOTE — Progress Notes (Signed)
Chief Complaint: FU   Referring Provider:  Dr Delena Bali        ASSESSMENT AND PLAN;    #1. IBS-C. H/O fecal impaction s/p manual disimpaction 05/2020.  Subsequent rectal exams neg.  Stool soft heme-neg. Neg stool studies, virtual colon 11/2017. Failed bentyl, amitriptyline. Refuses colon. Neg cologuard 08/2021  #2. Chronic abdo/pelvic pain (since hysterectomy 04/2016). Dx with IC by urology, fibromyalgia/anxiety/depression/panic attacks/conversion disorder/pseudoseizures. Multiple ED visits. Multiple neg CT scans A/P- 10/2021, 07/2021, 03/28/2019, 03/25/2019, 02/14/2019, 12/2016, 07/2017, 12/2018.    #3. GERD with neg Ba Swallow 04/2018.  Neg EGD 04/24/2019, 02/2020. Failed carafate, GI coctail, protonix, omeprazole, nexium (caused burning) ("can't do PPIs").   #4. SIGNIFICANT ANXIETY/DEPRESSION/PANIC DISORDER/CONVERSION DISORDER/PTSD  #5.  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    Plan:   -Increase water intake. -Small but more frequent meals.  -Reassured pt -Continue pepcid PRN -No GI intervention planned at this time. -I have gone over recent CT Abdo/pelvis with contrast 10/2021 with the patient in detail.  If any further upper GI problems, would consider solid-phase gastric emptying scan.   HPI:     Donna Francis is a 61 y.o. female  With fibromyalgia, anxiety/depression, costochondritis  For follow-up visit.  Can only eat chicken, seafood, greens, rice, fruits like berries. Chobani yogburt. Couldnot handle boost/ensure. Better with pepcid  Eating 5-6 meals a day.  Gen abdo pain with neg recent CT AP with contrast 10/2021 Nl LFTs.  Bms - now 1/day, still constipated, but has to push at times. After hystrectomy and EGD.  Occasional pellet-like stools.  Brings in several pictures-some with mushy bowel movements and some pellet-like stools.  Better with increased water intake.  At times would have crampy nonspecific abdominal pain attributed to spasms.   These do get better with defecation.   Wt Readings from Last 3 Encounters:  11/30/21 72 lb 2 oz (32.7 kg)  09/04/21 70 lb (31.8 kg)  04/27/21 69 lb 6 oz (31.5 kg)   Her cardiology work-up has been neg.  She has done well with the behavioral therapy.   Daughter with ETOH pancreatitis in [facility] Sister has IBS at [place].  Now diagnosed with epigastric hernia.  Awaiting solid-phase gastric emptying scan.  From previous records:  -occ dysphagia. S/P EGD by Dr M Oct 2021 at [facility] while inpt. Has problems waking up. Thereafter, had dysphagia (with solids and liquids) and odynophagia and abdo pain d/t spasms. Convinced that most of problems are related to recent EGD  -Chronic abd pain with associated nausea but no vomiting, feeling dizzy, weakness, occasional shortness of breath, fatigue, headaches, palpitations, back pain, belching, unexplained weight loss, "a lot of gas"  -Can only eat chicken, sea food, berries, apple sauce. Pediasure didn't work.  Tolerating small meals.   -S/p EGD 04/24/2019 showing mild gastritis.  Biopsies were negative for H. Pylori.  SB Bx- neg for celiac disease.  Intolerance to multiple PPIs.  -She is under care of [name] -psychologist in [place].  She really likes her.  Situational anxiety is getting under control.  -All her symptoms started after she had a hysterectomy 04/2016.  She has been seen by GYN at [facility].  I have reviewed the notes in care everywhere.  It has been recommended to start pelvic physical therapy.   -Refuses colonoscopy.  Wt Readings from Last 3 Encounters:  11/30/21 72 lb 2 oz (32.7 kg)  09/04/21 70 lb (31.8 kg)  04/27/21 69 lb 6 oz (31.5  kg)   CT AP with contrast 10/2021 IMPRESSION: No acute abnormality identified in the abdomen or pelvis.      Past Medical History:  Diagnosis Date   Anxiety     Colitis     IBS (irritable bowel syndrome)     Varicose veins of bilateral lower extremities with pain             Past  Surgical History:  Procedure Laterality Date   ABDOMINAL HYSTERECTOMY   04/2016   CESAREAN SECTION        x 4   COLONOSCOPY   1995    in [place]           Family History  Problem Relation Age of Onset   Pancreatic cancer Mother        Social History         Tobacco Use   Smoking status: Never Smoker   Smokeless tobacco: Never Used  Substance Use Topics   Alcohol use: Yes      Comment: minimal   Drug use: No            Current Outpatient Medications  Medication Sig Dispense Refill   ALPRAZolam (XANAX) 0.5 MG tablet Take 1 mg by mouth 4 times        Probiotic Product (PROBIOTIC DAILY PO) Take by mouth daily.        No current facility-administered medications for this visit.       No Known Allergies   Review of Systems:  Psychiatric/Behavioral:  Has anxiety or depression       Physical Exam:    Vitals:   11/30/21 1111  Weight: 72 lb 2 oz (32.7 kg)  Height: '4\' 8"'$  (1.422 m)   Blood pressure 146/80, pulse 100/min  Gen: awake, alert, NAD HEENT: anicteric, no pallor CV: RRR, no mrg Pulm: CTA b/l Abd: soft, NT/ND, +BS throughout Ext: no c/c/e Neuro: nonfocal     Latest Ref Rng & Units 10/29/2021    3:01 PM 03/22/2021    2:26 AM 04/07/2019    6:07 PM  CBC  WBC 4.0 - 10.5 K/uL 4.4  4.3  4.2   Hemoglobin 12.0 - 15.0 g/dL 11.8  13.4  12.2   Hematocrit 36.0 - 46.0 % 36.5  40.4  37.7   Platelets 150 - 400 K/uL 263  302  326       Latest Ref Rng & Units 10/29/2021    3:01 PM 03/22/2021    2:26 AM 04/07/2019    6:07 PM  CMP  Glucose 70 - 99 mg/dL 77  92  95   BUN 6 - 20 mg/dL 8  8  <5   Creatinine 0.44 - 1.00 mg/dL 0.48  0.44  0.48   Sodium 135 - 145 mmol/L 139  132  136   Potassium 3.5 - 5.1 mmol/L 3.7  4.3  3.4   Chloride 98 - 111 mmol/L 107  93  99   CO2 22 - 32 mmol/L '25  29  26   '$ Calcium 8.9 - 10.3 mg/dL 8.8  10.0  9.5   Total Protein 6.5 - 8.1 g/dL 7.2  8.4  7.8   Total Bilirubin 0.3 - 1.2 mg/dL 0.6  0.6  0.6   Alkaline Phos 38 - 126 U/L 57   61  51   AST 15 - 41 U/L '23  23  22   '$ ALT 0 - 44 U/L 25  21  20  Carmell Austria, MD   Cc: Dr Delena Bali

## 2022-02-24 ENCOUNTER — Encounter: Payer: Self-pay | Admitting: Neurology

## 2022-05-26 DIAGNOSIS — F411 Generalized anxiety disorder: Secondary | ICD-10-CM | POA: Diagnosis not present

## 2022-05-26 DIAGNOSIS — R69 Illness, unspecified: Secondary | ICD-10-CM | POA: Diagnosis not present

## 2022-05-26 DIAGNOSIS — J4520 Mild intermittent asthma, uncomplicated: Secondary | ICD-10-CM | POA: Diagnosis not present

## 2022-05-31 DIAGNOSIS — H9201 Otalgia, right ear: Secondary | ICD-10-CM | POA: Diagnosis not present

## 2022-05-31 DIAGNOSIS — M2669 Other specified disorders of temporomandibular joint: Secondary | ICD-10-CM | POA: Diagnosis not present

## 2022-06-02 DIAGNOSIS — F411 Generalized anxiety disorder: Secondary | ICD-10-CM | POA: Diagnosis not present

## 2022-06-02 DIAGNOSIS — R69 Illness, unspecified: Secondary | ICD-10-CM | POA: Diagnosis not present

## 2022-06-09 DIAGNOSIS — F411 Generalized anxiety disorder: Secondary | ICD-10-CM | POA: Diagnosis not present

## 2022-06-09 DIAGNOSIS — R69 Illness, unspecified: Secondary | ICD-10-CM | POA: Diagnosis not present

## 2022-06-11 ENCOUNTER — Ambulatory Visit (INDEPENDENT_AMBULATORY_CARE_PROVIDER_SITE_OTHER): Payer: Medicare HMO | Admitting: Gastroenterology

## 2022-06-11 ENCOUNTER — Ambulatory Visit (INDEPENDENT_AMBULATORY_CARE_PROVIDER_SITE_OTHER)
Admission: RE | Admit: 2022-06-11 | Discharge: 2022-06-11 | Disposition: A | Discharge status: Home / Self Care | Payer: Medicare HMO | Source: Ambulatory Visit | Attending: Gastroenterology | Admitting: Gastroenterology

## 2022-06-11 ENCOUNTER — Encounter: Payer: Self-pay | Admitting: Gastroenterology

## 2022-06-11 VITALS — BP 118/68 | HR 55 | Ht <= 58 in | Wt 73.0 lb

## 2022-06-11 DIAGNOSIS — R102 Pelvic and perineal pain: Secondary | ICD-10-CM

## 2022-06-11 DIAGNOSIS — G8929 Other chronic pain: Secondary | ICD-10-CM

## 2022-06-11 DIAGNOSIS — K581 Irritable bowel syndrome with constipation: Secondary | ICD-10-CM

## 2022-06-11 DIAGNOSIS — R109 Unspecified abdominal pain: Secondary | ICD-10-CM

## 2022-06-11 DIAGNOSIS — K219 Gastro-esophageal reflux disease without esophagitis: Secondary | ICD-10-CM

## 2022-06-11 MED ORDER — HYOSCYAMINE SULFATE 0.125 MG SL SUBL: 0.1250 mg | SUBLINGUAL_TABLET | Freq: Four times a day (QID) | SUBLINGUAL | 1 refills | Status: DC | PRN

## 2022-06-11 NOTE — Patient Instructions (Addendum)
_______________________________________________________  If your blood pressure at your visit was 140/90 or greater, please contact your primary care physician to follow up on this.  _______________________________________________________  If you are age 60 or older, your body mass index should be between 23-30. Your Body mass index is 16.08 kg/m. If this is out of the aforementioned range listed, please consider follow up with your Primary Care Provider.  If you are age 36 or younger, your body mass index should be between 19-25. Your Body mass index is 16.08 kg/m. If this is out of the aformentioned range listed, please consider follow up with your Primary Care Provider.   ________________________________________________________  The [place] [facility] providers would like to encourage you to use [facility] to communicate with providers for non-urgent requests or questions.  Due to long hold times on the telephone, sending your provider a message by [facility] may be a faster and more efficient way to get a response.  Please allow 48 business hours for a response.  Please remember that this is for non-urgent requests.  _______________________________________________________  Your provider has requested that you have an abdominal x ray before leaving today. Please go to the basement floor to our Radiology department for the test.  Increase water intake  Eat small but frequent meals  We have sent the following medications to your pharmacy for you to pick up at your convenience: Levsin every 6 hours as needed  Continue pepcid as needed  Thank you,  Dr. Jackquline Denmark

## 2022-06-11 NOTE — Progress Notes (Signed)
Chief Complaint: FU   Referring Provider:  Dr Delena Bali        ASSESSMENT AND PLAN;    #1. IBS-C. H/O fecal impaction s/p manual disimpaction 05/2020.  Subsequent rectal exams neg.  Stool soft heme-neg. Neg stool studies, virtual colon 11/2017. Failed bentyl, amitriptyline. Refuses colon. Neg cologuard 08/2021  #2. Chronic abdo/pelvic pain (since hysterectomy 04/2016). Dx with IC by urology, fibromyalgia/anxiety/depression/panic attacks/conversion disorder/pseudoseizures. Multiple ED visits. Multiple neg CT scans A/P- 10/2021, 07/2021, 03/28/2019, 03/25/2019, 02/14/2019, 12/2016, 07/2017, 12/2018.    #3. GERD with neg Ba Swallow 04/2018.  Neg EGD 04/24/2019, 02/2020. Failed carafate, GI coctail, protonix, omeprazole, nexium (caused burning) ("can't do PPIs").   #4. SIGNIFICANT ANXIETY/DEPRESSION/PANIC DISORDER/CONVERSION DISORDER/PTSD  #5.  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    Plan:  - X ray KUB 2 view today -Increase water intake. -Small but more frequent meals.  -Reassured pt -Levsin 0.'125mg'$  S/L Q6hrs prn #60 -Continue pepcid PRN -No GI intervention planned at this time. -If any further upper GI problems, would consider solid-phase gastric emptying scan.   HPI:     Donna Francis is a 62 y.o. female  With fibromyalgia, anxiety/depression, costochondritis  For follow-up visit. Cramping, sharp gen pain Episode- abdo pain with diarrhea since christmas 2023, But does get  Has TMJ. No fever  Can only eat chicken, seafood, greens, rice, fruits like berries. Chobani yogburt. Couldnot handle boost/ensure. Better with pepcid  Eating 5-6 meals a day.  Gen abdo pain with neg recent CT AP with contrast 10/2021 Nl LFTs.  Bms - now 1/day, still constipated, but has to push at times. After hystrectomy and EGD.  Occasional pellet-like stools.  Brings in several pictures-some with mushy bowel movements and some pellet-like stools.  Better with increased water  intake.  At times would have crampy nonspecific abdominal pain attributed to spasms.  These do get better with defecation.   Wt Readings from Last 3 Encounters:  06/11/22 73 lb (33.1 kg)  11/30/21 72 lb 2 oz (32.7 kg)  09/04/21 70 lb (31.8 kg)   Her cardiology work-up has been neg.  She has done well with the behavioral therapy.   Daughter with ETOH pancreatitis Sister has IBS at [place].  Now diagnosed with epigastric hernia.   From previous records:  -occ dysphagia. S/P EGD by Dr M Oct 2021 at [facility] while inpt. Has problems waking up. Thereafter, had dysphagia (with solids and liquids) and odynophagia and abdo pain d/t spasms. Convinced that most of problems are related to recent EGD  -Chronic abd pain with associated nausea but no vomiting, feeling dizzy, weakness, occasional shortness of breath, fatigue, headaches, palpitations, back pain, belching, unexplained weight loss, "a lot of gas"  -Can only eat chicken, sea food, berries, apple sauce. Pediasure didn't work.  Tolerating small meals.   -S/p EGD 04/24/2019 showing mild gastritis.  Biopsies were negative for H. Pylori.  SB Bx- neg for celiac disease.  Intolerance to multiple PPIs.  -She is under care of [name] -psychologist in [place].  She really likes her.  Situational anxiety is getting under control.  -All her symptoms started after she had a hysterectomy 04/2016.  She has been seen by GYN at [facility].  I have reviewed the notes in care everywhere.  It has been recommended to start pelvic physical therapy.   -Refuses colonoscopy. Neg cologuard 08/2021: neg  Wt Readings from Last 3 Encounters:  06/11/22 73 lb (33.1 kg)  11/30/21 72 lb  2 oz (32.7 kg)  09/04/21 70 lb (31.8 kg)   CT AP with contrast 10/2021 IMPRESSION: No acute abnormality identified in the abdomen or pelvis.      Past Medical History:  Diagnosis Date   Anxiety     Colitis     IBS (irritable bowel syndrome)     Varicose veins of bilateral  lower extremities with pain             Past Surgical History:  Procedure Laterality Date   ABDOMINAL HYSTERECTOMY   04/2016   CESAREAN SECTION        x 4   COLONOSCOPY   1995    in [place]           Family History  Problem Relation Age of Onset   Pancreatic cancer Mother        Social History         Tobacco Use   Smoking status: Never Smoker   Smokeless tobacco: Never Used  Substance Use Topics   Alcohol use: Yes      Comment: minimal   Drug use: No            Current Outpatient Medications  Medication Sig Dispense Refill   ALPRAZolam (XANAX) 0.5 MG tablet Take 1 mg by mouth 4 times        Probiotic Product (PROBIOTIC DAILY PO) Take by mouth daily.        No current facility-administered medications for this visit.       No Known Allergies   Review of Systems:  Psychiatric/Behavioral:  Has anxiety or depression       Physical Exam:    Vitals:   06/11/22 1455  Weight: 73 lb (33.1 kg)  Height: 4' 8.5" (1.435 m)   Blood pressure 146/80, pulse 100/min  Gen: awake, alert, NAD HEENT: anicteric, no pallor CV: RRR, no mrg Pulm: CTA b/l Abd: soft, NT/ND, +BS throughout Ext: no c/c/e Neuro: nonfocal     Latest Ref Rng & Units 10/29/2021    3:01 PM 03/22/2021    2:26 AM 04/07/2019    6:07 PM  CBC  WBC 4.0 - 10.5 K/uL 4.4  4.3  4.2   Hemoglobin 12.0 - 15.0 g/dL 11.8  13.4  12.2   Hematocrit 36.0 - 46.0 % 36.5  40.4  37.7   Platelets 150 - 400 K/uL 263  302  326       Latest Ref Rng & Units 10/29/2021    3:01 PM 03/22/2021    2:26 AM 04/07/2019    6:07 PM  CMP  Glucose 70 - 99 mg/dL 77  92  95   BUN 6 - 20 mg/dL 8  8  <5   Creatinine 0.44 - 1.00 mg/dL 0.48  0.44  0.48   Sodium 135 - 145 mmol/L 139  132  136   Potassium 3.5 - 5.1 mmol/L 3.7  4.3  3.4   Chloride 98 - 111 mmol/L 107  93  99   CO2 22 - 32 mmol/L '25  29  26   '$ Calcium 8.9 - 10.3 mg/dL 8.8  10.0  9.5   Total Protein 6.5 - 8.1 g/dL 7.2  8.4  7.8   Total Bilirubin 0.3 - 1.2 mg/dL 0.6   0.6  0.6   Alkaline Phos 38 - 126 U/L 57  61  51   AST 15 - 41 U/L '23  23  22   '$ ALT 0 - 44 U/L 25  [place], MD   Cc: Dr Delena Bali

## 2022-06-16 ENCOUNTER — Telehealth: Payer: Self-pay | Admitting: Gastroenterology

## 2022-06-16 DIAGNOSIS — F411 Generalized anxiety disorder: Secondary | ICD-10-CM | POA: Diagnosis not present

## 2022-06-16 DIAGNOSIS — R69 Illness, unspecified: Secondary | ICD-10-CM | POA: Diagnosis not present

## 2022-06-16 NOTE — Telephone Encounter (Signed)
Inbound call from patient, states she has not started her medication Hyoscyamine prescribed by Dr. Lyndel Safe because of Insurance. Patient adds she feels there should be more done for her. States a CT or [place] should be done, states her pain and spasms are continuing. Please advise.

## 2022-06-17 ENCOUNTER — Other Ambulatory Visit (HOSPITAL_COMMUNITY): Payer: Self-pay

## 2022-06-17 ENCOUNTER — Telehealth: Payer: Self-pay | Admitting: Pharmacy Technician

## 2022-06-17 NOTE — Telephone Encounter (Signed)
Patient Advocate Encounter  Received notification from [place] that prior authorization for HYOSCYAMINE 0.'125MG'$  is required.   PA submitted on 1.25.24 Key [id number] Status is pending

## 2022-06-17 NOTE — Telephone Encounter (Signed)
Spoke with Pt. Documented under phone note:

## 2022-06-17 NOTE — Telephone Encounter (Signed)
Pt stated that she was recently prescribed a medication hyoscyamine. Pt stated that it was going to be $ 90 dollars and $ 30 dollars with [facility]. Pt stated that she still cannot afford the medications: Please advise is a PA is needed or could reduce the price any for pt:

## 2022-06-17 NOTE — Telephone Encounter (Signed)
PA has been submitted, and telephone encounter has been created.  Test billing results did return as  Product/Service Not Covered. However, I did submit a PA as a Non-Formulary Drug. Will update other encounter was received.

## 2022-06-18 ENCOUNTER — Other Ambulatory Visit: Payer: Self-pay

## 2022-06-18 DIAGNOSIS — R109 Unspecified abdominal pain: Secondary | ICD-10-CM

## 2022-06-18 MED ORDER — DICYCLOMINE HCL 10 MG PO CAPS
10.0000 mg | ORAL_CAPSULE | Freq: Three times a day (TID) | ORAL | 2 refills | Status: DC | PRN
Start: 2022-06-18 — End: 2023-12-13

## 2022-06-18 NOTE — Telephone Encounter (Signed)
Can hold off. Can use Bentyl 10 mg p.o. TID PRN (90, 2RF) RG

## 2022-06-18 NOTE — Telephone Encounter (Signed)
Please see notes below and other phone note sent on pt. Please review and advise

## 2022-06-18 NOTE — Telephone Encounter (Signed)
Received a fax regarding Prior Authorization from [place] for [place] 0.'125MG'$ . Authorization has been DENIED because this medication is classified as being part of the FDA's Drug Efficacy Study Implementation program.  Phone# [phone]

## 2022-06-18 NOTE — Telephone Encounter (Signed)
Pt made aware of Dr. Gupta recommendations: Prescription sent to pharmacy. Pt made aware. Pt verbalized understanding with all questions answered.   

## 2022-06-24 DIAGNOSIS — R69 Illness, unspecified: Secondary | ICD-10-CM | POA: Diagnosis not present

## 2022-06-24 DIAGNOSIS — F411 Generalized anxiety disorder: Secondary | ICD-10-CM | POA: Diagnosis not present

## 2022-06-25 DIAGNOSIS — H6012 Cellulitis of left external ear: Secondary | ICD-10-CM | POA: Diagnosis not present

## 2022-06-25 DIAGNOSIS — M797 Fibromyalgia: Secondary | ICD-10-CM | POA: Diagnosis not present

## 2022-06-25 DIAGNOSIS — K219 Gastro-esophageal reflux disease without esophagitis: Secondary | ICD-10-CM | POA: Diagnosis not present

## 2022-06-25 DIAGNOSIS — Z202 Contact with and (suspected) exposure to infections with a predominantly sexual mode of transmission: Secondary | ICD-10-CM | POA: Diagnosis not present

## 2022-06-25 DIAGNOSIS — R69 Illness, unspecified: Secondary | ICD-10-CM | POA: Diagnosis not present

## 2022-06-25 DIAGNOSIS — R636 Underweight: Secondary | ICD-10-CM | POA: Diagnosis not present

## 2022-06-25 DIAGNOSIS — K582 Mixed irritable bowel syndrome: Secondary | ICD-10-CM | POA: Diagnosis not present

## 2022-06-26 DIAGNOSIS — J4520 Mild intermittent asthma, uncomplicated: Secondary | ICD-10-CM | POA: Diagnosis not present

## 2022-06-30 DIAGNOSIS — R69 Illness, unspecified: Secondary | ICD-10-CM | POA: Diagnosis not present

## 2022-06-30 DIAGNOSIS — F411 Generalized anxiety disorder: Secondary | ICD-10-CM | POA: Diagnosis not present

## 2022-07-07 DIAGNOSIS — R69 Illness, unspecified: Secondary | ICD-10-CM | POA: Diagnosis not present

## 2022-07-07 DIAGNOSIS — F411 Generalized anxiety disorder: Secondary | ICD-10-CM | POA: Diagnosis not present

## 2022-07-13 NOTE — Progress Notes (Unsigned)
NEUROLOGY CONSULTATION NOTE  Real David MRN: [id number] DOB: March 17, 1961  Referring provider: Laverna Peace, NP Primary care provider: Laverna Peace, NP  Reason for consult:  headaches  Assessment/Plan:   Chronic tension type headache Dizziness  Patient is not interested in starting a preventative medication as she reports adverse reaction to antidepressants, which would be the primary treatment, and declines antiepileptic medications such as gabapentin.  In light of prior normal brain MRI, unremarkable neurologic exam, and resolution of new daily persistent headache, I do not suspect a more serious secondary intracranial etiology.  Follow up as needed.   Subjective:  Donna Francis is a 62 year old female with rheumatoid arthritis, IBS, fibromyalgia, osteopenia, anxiety with panic attacks and history of psychogenic non-epileptic seizures who presents for headaches.  History supplemented by referring provider's note.  Patient has had headaches becoming more frequent since last year.  She describes an aching and throbbing pain typically above the left eye.  No associated nausea, vomiting, photophobia, phonophobia, visual disturbance.  Typically lasts 1 to 2 hours with Tylenol and occurs daily. 02/09/2022 MRI brain without contrast on 02/09/2022 showed rare nonspecific punctate T2 hyperintense foci within the cerebral white matter within normal limits.   In January 2024, they became persistent, now a diffuse pressure, sometimes sharp.  She has history of vertigo but her dizziness became more frequent during this time.  Symptoms lasted a month and then they subsided.  They have since returned to her typical headaches.  She does have severe anxiety and is prone to panic attacks.    02/04/2022 LABS:  Sed rate 25; CBC with WBC 4.9, HGB 12.8, HCT 40.4, PLT 281; CMP with Na 139, K 4.6, Cl 97, CO2 29, Ca 10.1, BUN 9, Cr 0.54, glucose 80, t bili 0.3, AKP 82, AST 27, ALT 35; Mg 2.4; TSH  1.240   PAST MEDICAL HISTORY: Past Medical History:  Diagnosis Date   Allergy    Anemia    Anxiety    Asthma    pt states diagnosed by Pulmonologist   Colitis    Fibromyalgia    GERD (gastroesophageal reflux disease)    pt reports buringing in abdomen that has recently started moving up higher into her chest   Heart rate fast    IBS (irritable bowel syndrome)    Interstitial cystitis    Osteopenia after menopause    Panic attacks    Rapid heartbeat    Rheumatoid arthritis ([place])    Seizures (H[facility])    UTI (urinary tract infection)    Varicose veins of bilateral lower extremities with pain     PAST SURGICAL HISTORY: Past Surgical History:  Procedure Laterality Date   CESAREAN SECTION     x 4   COLONOSCOPY  1995   in [place]   ESOPHAGOGASTRODUODENOSCOPY  03/07/2020   [facility] Dr Lyda Jester. Normal upper endoscopy   LAPAROSCOPIC HYSTERECTOMY  04/26/2016   TUBAL LIGATION      MEDICATIONS: Current Outpatient Medications on File Prior to Visit  Medication Sig Dispense Refill   acetaminophen (TYLENOL) 500 MG tablet Take 500 mg by mouth 3 (three) times daily.     ALPRAZolam (XANAX) 1 MG tablet Take 1 mg by mouth 4 (four) times daily.     Alum & Mag Hydroxide-Simeth (MYLANTA PO) Take 1 tablet by mouth as needed.     dicyclomine (BENTYL) 10 MG capsule Take 1 capsule (10 mg total) by mouth 3 (three) times daily as needed for spasms.  90 capsule 2   lidocaine (XYLOCAINE) 2 % solution Use as directed 15 mLs in the mouth or throat as needed for mouth pain.     loratadine (CLARITIN) 5 MG chewable tablet Chew 10 mg by mouth daily.     montelukast (SINGULAIR) 5 MG chewable tablet Chew 5 mg by mouth daily.     ondansetron (ZOFRAN-ODT) 4 MG disintegrating tablet DISSOLVE 1 TABLET IN MOUTH EVERY 6 HOURS AS NEEDED FOR NAUSEA FOR VOMITING 20 tablet 2   No current facility-administered medications on file prior to visit.    ALLERGIES: Allergies  Allergen Reactions   Fentanyl     Haldol [Haloperidol]    Kenalog [Triamcinolone] Other (See Comments)    Stomach cramps   Propofol Other (See Comments)    Hallucinations and anxiety. Only low dose Hallucinations and anxiety Hallucinations and anxiety. Only low dose   Proton Pump Inhibitors Other (See Comments)    Pt reports multiple side effects Pt reports multiple side effects Pt reports multiple side effects    FAMILY HISTORY: Family History  Problem Relation Age of Onset   Pancreatic cancer Mother    Diabetes Mother    Colon cancer Neg Hx    Esophageal cancer Neg Hx    Rectal cancer Neg Hx    Stomach cancer Neg Hx     Objective:  Blood pressure 106/77, pulse (!) 128, height 4' 8"$  (1.422 m), weight 73 lb 14.4 oz (33.5 kg), SpO2 93 %. General: No acute distress.  Patient appears well-groomed.   Head:  Normocephalic/atraumatic Eyes:  fundi examined but not visualized Neck: supple, paraspinal tenderness, full range of motion Heart: regular rate and rhythm Neurological Exam: Mental status: alert and oriented to person, place, and time, speech fluent and not dysarthric, language intact. Cranial nerves: CN I: not tested CN II: pupils equal, round and reactive to light, visual fields intact CN III, IV, VI:  full range of motion, no nystagmus, no ptosis CN V: facial sensation intact. CN VII: upper and lower face symmetric CN VIII: hearing intact CN IX, X: gag intact, uvula midline CN XI: sternocleidomastoid and trapezius muscles intact CN XII: tongue midline Bulk & Tone: normal, no fasciculations. Motor:  muscle strength with give way weakness related to pain but overall intact. Sensation:  Temperature and vibratory sensation intact. Deep Tendon Reflexes:  3+ in knees, otherwise 2+ throughout,  toes downgoing.   Finger to nose testing:  Without dysmetria.   Heel to shin:  Without dysmetria.   Gait:  Normal station and stride.  Romberg negative.    Thank you for allowing me to take part in the care  of this patient.  Metta Clines, DO  CC: Laverna Peace, NP

## 2022-07-14 ENCOUNTER — Ambulatory Visit (INDEPENDENT_AMBULATORY_CARE_PROVIDER_SITE_OTHER): Payer: Medicare HMO | Admitting: Neurology

## 2022-07-14 ENCOUNTER — Encounter: Payer: Self-pay | Admitting: Neurology

## 2022-07-14 VITALS — BP 106/77 | HR 128 | Ht <= 58 in | Wt 73.9 lb

## 2022-07-14 DIAGNOSIS — G44229 Chronic tension-type headache, not intractable: Secondary | ICD-10-CM

## 2022-07-14 DIAGNOSIS — R42 Dizziness and giddiness: Secondary | ICD-10-CM | POA: Diagnosis not present

## 2022-07-14 DIAGNOSIS — R69 Illness, unspecified: Secondary | ICD-10-CM | POA: Diagnosis not present

## 2022-07-14 DIAGNOSIS — F411 Generalized anxiety disorder: Secondary | ICD-10-CM | POA: Diagnosis not present

## 2022-07-14 NOTE — Patient Instructions (Signed)
I think you just had a flare up of headaches.  I do not suspect tumor or anything else.  MRI of brain looked okay

## 2022-07-15 ENCOUNTER — Telehealth: Payer: Self-pay

## 2022-07-15 NOTE — Telephone Encounter (Signed)
Patient is calling in stating that after she left she realized she provided the wrong information to [facility]. Wanted to inform us that she usually had headaches but after getting covid last summer that she noticed the headache worsening, unable to sleep, pain relievers were not helping the pain and then noticed that she would get the bad brain fog, having balance issues, and dizzy spells. When she spoke to PCP that is when they did the MRI. Kristian says that the headaches were pretty persistent but over time they would get less painful. Currently still gets them daily but its not as painful. She got confused on timeline of things, January of this year was when her fibromyalgia started worsening. Apologizes said she didn't realize she was in a brain fog until she got home, and that her typical routine was thrown off all day by having so many appointments.

## 2022-07-21 DIAGNOSIS — F411 Generalized anxiety disorder: Secondary | ICD-10-CM | POA: Diagnosis not present

## 2022-07-21 DIAGNOSIS — R69 Illness, unspecified: Secondary | ICD-10-CM | POA: Diagnosis not present

## 2022-07-25 DIAGNOSIS — J4520 Mild intermittent asthma, uncomplicated: Secondary | ICD-10-CM | POA: Diagnosis not present

## 2022-07-26 ENCOUNTER — Encounter: Payer: Self-pay | Admitting: Gastroenterology

## 2022-07-28 DIAGNOSIS — R69 Illness, unspecified: Secondary | ICD-10-CM | POA: Diagnosis not present

## 2022-07-28 DIAGNOSIS — F411 Generalized anxiety disorder: Secondary | ICD-10-CM | POA: Diagnosis not present

## 2022-07-29 DIAGNOSIS — J4520 Mild intermittent asthma, uncomplicated: Secondary | ICD-10-CM | POA: Diagnosis not present

## 2022-08-04 DIAGNOSIS — R69 Illness, unspecified: Secondary | ICD-10-CM | POA: Diagnosis not present

## 2022-08-04 DIAGNOSIS — F411 Generalized anxiety disorder: Secondary | ICD-10-CM | POA: Diagnosis not present

## 2022-08-10 IMAGING — CT CT ABD-PELV W/ CM
2 of 5 series · 16 of 46 positions shown, 18 images · IV contrast (omnipaque)
Comparison: None.

CLINICAL DATA: Upper abdominal pain.

EXAM:
CT ABDOMEN AND PELVIS WITH CONTRAST
TECHNIQUE: Multidetector CT imaging of the abdomen and pelvis was performed
using the standard protocol following bolus administration of
intravenous contrast.
CONTRAST:  50mL OMNIPAQUE IOHEXOL 350 MG/ML SOLN

[Series 2: axial st · axial · 0.62mm/px · z∈[+1142,+1432]mm · 13 of 68 slices shown, 15 images]
[im 5/68  soft-tissue]
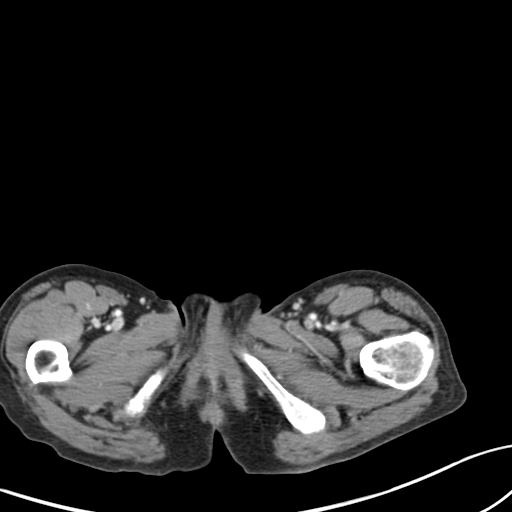
[im 5/68  bone]
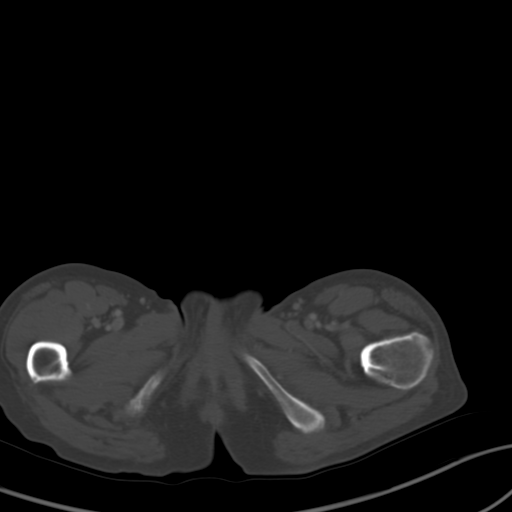
[im 10/68  soft-tissue]
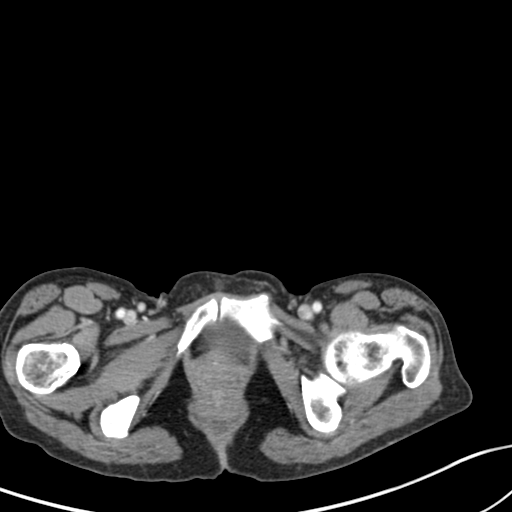
[im 15/68  soft-tissue]
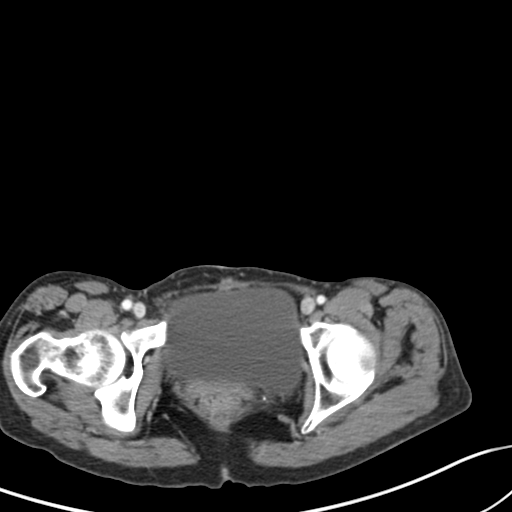
[im 20/68  soft-tissue]
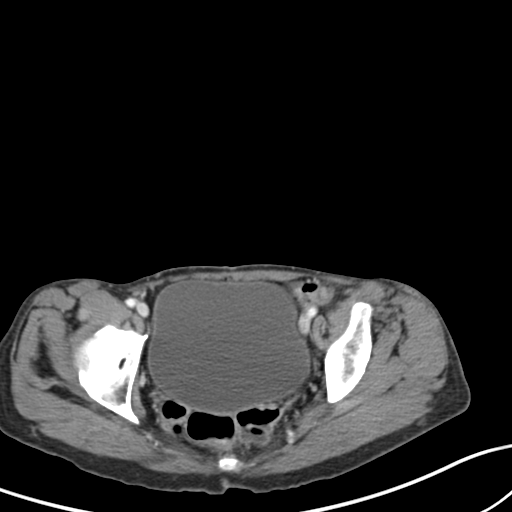
[im 24/68  soft-tissue]
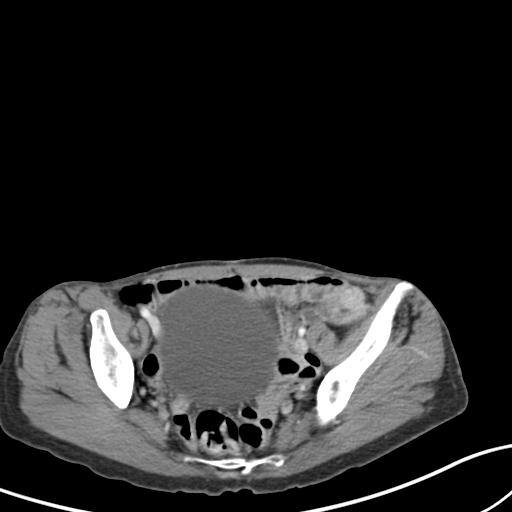
[im 29/68  soft-tissue]
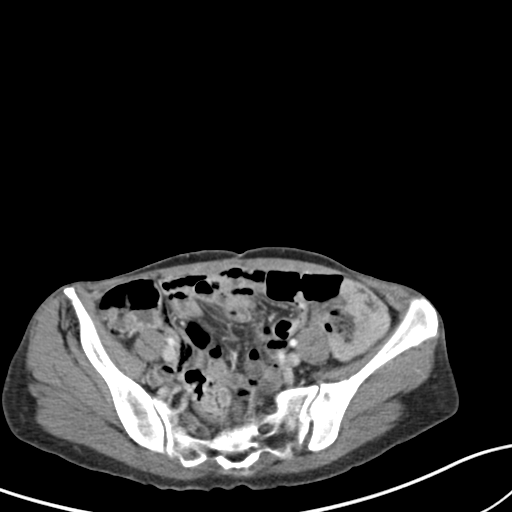
[im 34/68  soft-tissue]
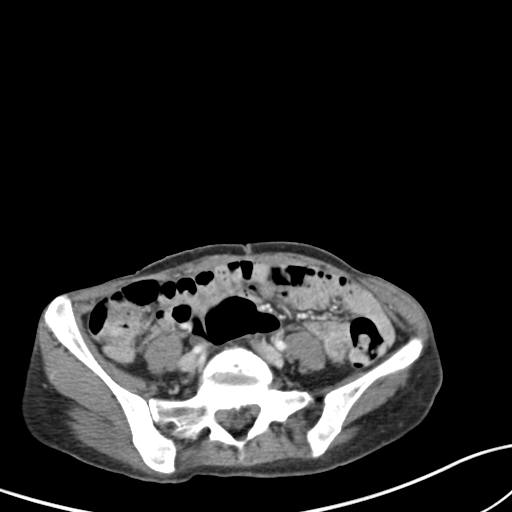
[im 39/68  soft-tissue]
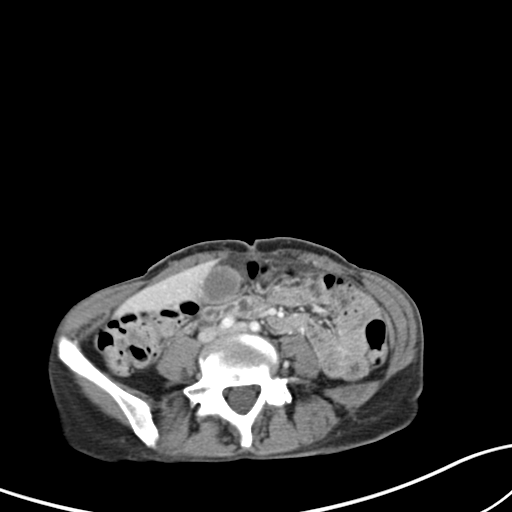
[im 44/68  soft-tissue]
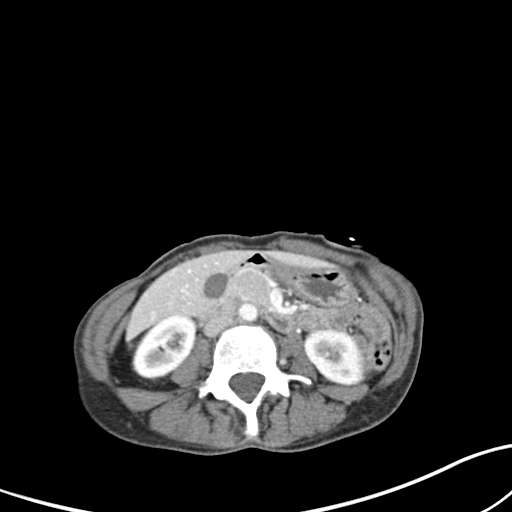
[im 44/68  bone]
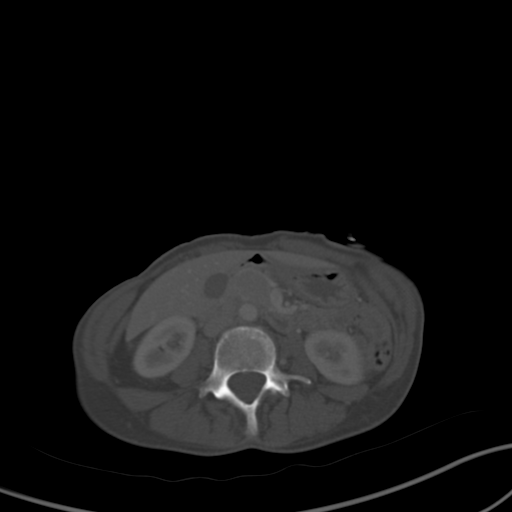
[im 48/68  soft-tissue]
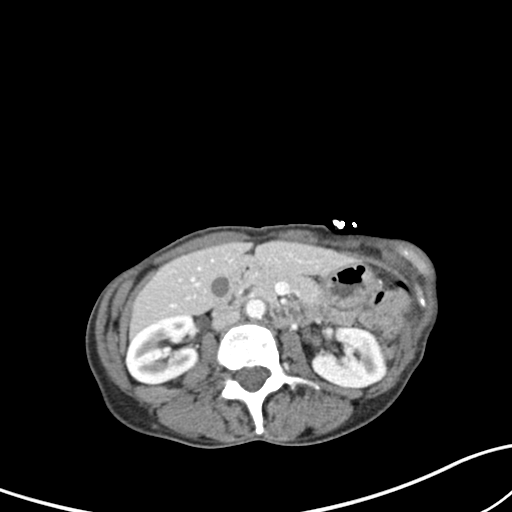
[im 53/68  soft-tissue]
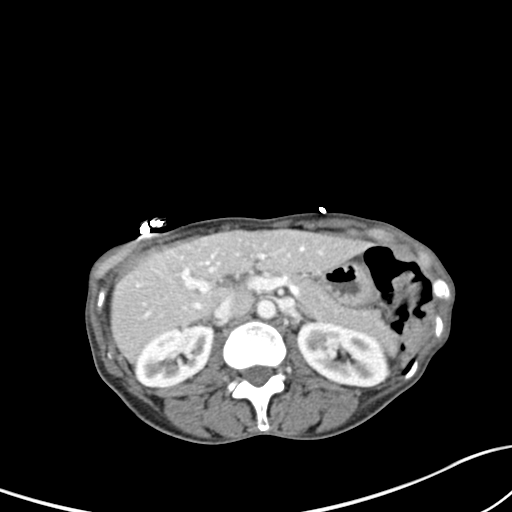
[im 58/68  soft-tissue]
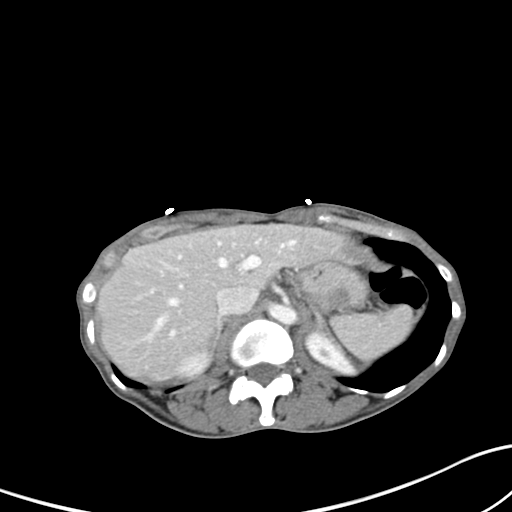
[im 63/68  soft-tissue]
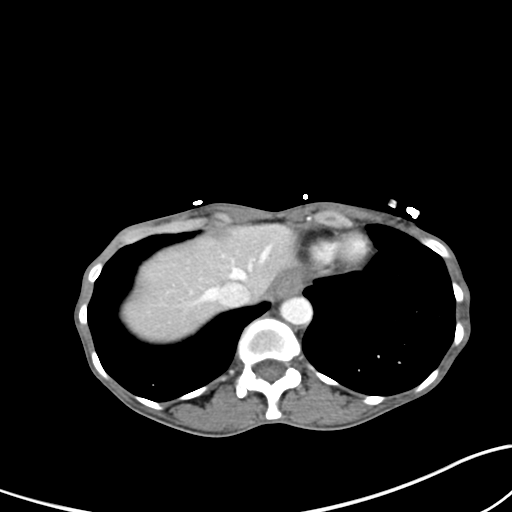

[Series 5: coronal st · coronal · 0.68mm/px · 3 of 95 slices shown]
[im 32/95  soft-tissue]
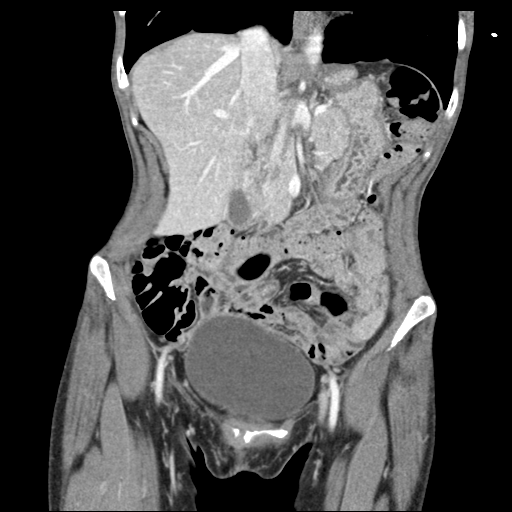
[im 42/95  soft-tissue]
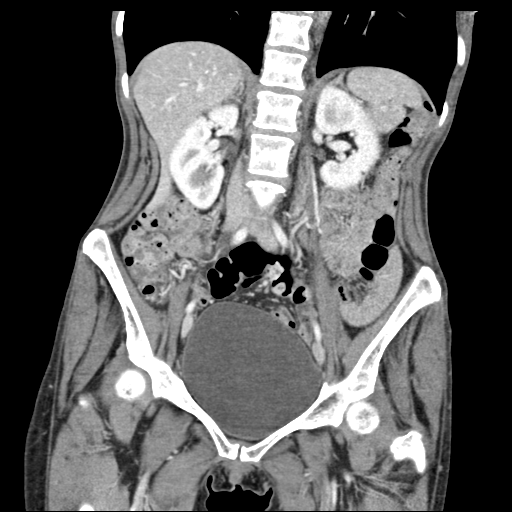
[im 53/95  soft-tissue]
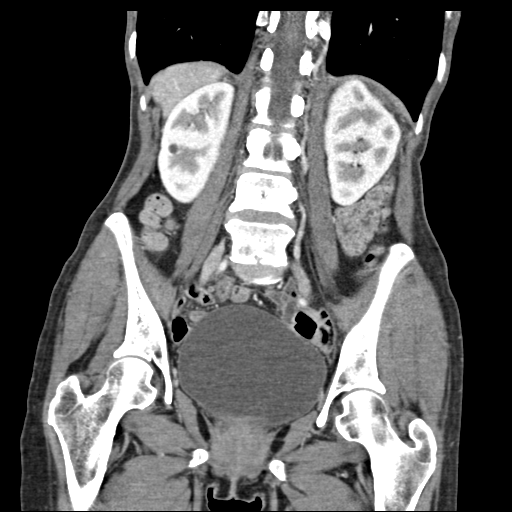

[16 of 46 positions shown; findings below may reference images not displayed]

FINDINGS: Lower chest: Lung bases are clear.

Hepatobiliary: No focal hepatic lesion. No biliary duct dilatation.
Common bile duct is normal.

Pancreas: Pancreas is normal. No ductal dilatation. No pancreatic
inflammation.

Spleen: Normal spleen

Adrenals/urinary tract: Adrenal glands and kidneys are normal. The
ureters and bladder normal.

Stomach/Bowel: Stomach, small bowel, appendix, and cecum are normal.
The colon and rectosigmoid colon are normal.

Vascular/Lymphatic: Abdominal aorta is normal caliber. No periportal
or retroperitoneal adenopathy. No pelvic adenopathy.

Reproductive: Post hysterectomy.  Adnexa unremarkable

Other: No free fluid.

Musculoskeletal: No aggressive osseous lesion.
IMPRESSION: No acute findings in the abdomen pelvis.

## 2022-08-11 DIAGNOSIS — R69 Illness, unspecified: Secondary | ICD-10-CM | POA: Diagnosis not present

## 2022-08-11 DIAGNOSIS — F411 Generalized anxiety disorder: Secondary | ICD-10-CM | POA: Diagnosis not present

## 2022-08-16 DIAGNOSIS — R636 Underweight: Secondary | ICD-10-CM | POA: Diagnosis not present

## 2022-08-16 DIAGNOSIS — F419 Anxiety disorder, unspecified: Secondary | ICD-10-CM | POA: Diagnosis not present

## 2022-08-16 DIAGNOSIS — R109 Unspecified abdominal pain: Secondary | ICD-10-CM | POA: Diagnosis not present

## 2022-08-16 DIAGNOSIS — R748 Abnormal levels of other serum enzymes: Secondary | ICD-10-CM | POA: Diagnosis not present

## 2022-08-16 DIAGNOSIS — R69 Illness, unspecified: Secondary | ICD-10-CM | POA: Diagnosis not present

## 2022-08-16 DIAGNOSIS — K219 Gastro-esophageal reflux disease without esophagitis: Secondary | ICD-10-CM | POA: Diagnosis not present

## 2022-08-16 DIAGNOSIS — M797 Fibromyalgia: Secondary | ICD-10-CM | POA: Diagnosis not present

## 2022-08-16 DIAGNOSIS — R531 Weakness: Secondary | ICD-10-CM | POA: Diagnosis not present

## 2022-08-16 LAB — CMP 10231: EGFR: 104

## 2022-08-17 ENCOUNTER — Telehealth: Payer: Self-pay | Admitting: Gastroenterology

## 2022-08-17 NOTE — Telephone Encounter (Signed)
Patient is calling states her liver enzymes are up even higher than before but she has been taking less Tylenol than before so she is not sure where this is coming from and is looking for advice. Does not want to speak with Remo Lipps. Please advise

## 2022-08-17 NOTE — Telephone Encounter (Signed)
Please see note below. 

## 2022-08-17 NOTE — Telephone Encounter (Addendum)
Spoke with Hildred Alamin, RN from PCP and her lab work came back slightly elevated and they told her to reduce her tylenol but the patient started have a fit and to go on about how much she needed her tylenol. I asked them to send the lab work to [place]. Please advise   CMP from 3-25 her AST was 62 and her ALT was 83

## 2022-08-18 DIAGNOSIS — R69 Illness, unspecified: Secondary | ICD-10-CM | POA: Diagnosis not present

## 2022-08-18 DIAGNOSIS — F411 Generalized anxiety disorder: Secondary | ICD-10-CM | POA: Diagnosis not present

## 2022-08-18 NOTE — Telephone Encounter (Signed)
Inbound call from patient f/u on previous message. Advised patient that the nuyrse will f/u with her at her earliest conveninece Patient advised understanding.

## 2022-08-18 NOTE — Telephone Encounter (Signed)
Patient stated that she is having a rash legs weakness cramps on the right side abdominally and going into a fog. I told her that if she is having real problems she needs to go the ED. But she then proceeded to tell that she was told by her PCP that she can come on after April 1st to get her lab work. I told her that she can do that. And to call her back with any concerns. And let us know the results.........Marland Kitchen

## 2022-08-18 NOTE — Telephone Encounter (Signed)
I have reviewed her previous Cts Liver looked normal. I agree to stop Tylenol Repeat LFTs in 4 weeks  RG

## 2022-08-23 DIAGNOSIS — R748 Abnormal levels of other serum enzymes: Secondary | ICD-10-CM | POA: Diagnosis not present

## 2022-08-25 DIAGNOSIS — J4520 Mild intermittent asthma, uncomplicated: Secondary | ICD-10-CM | POA: Diagnosis not present

## 2022-08-26 DIAGNOSIS — R109 Unspecified abdominal pain: Secondary | ICD-10-CM | POA: Diagnosis not present

## 2022-08-26 DIAGNOSIS — R1031 Right lower quadrant pain: Secondary | ICD-10-CM | POA: Diagnosis not present

## 2022-08-30 DIAGNOSIS — R350 Frequency of micturition: Secondary | ICD-10-CM | POA: Diagnosis not present

## 2022-08-30 DIAGNOSIS — R531 Weakness: Secondary | ICD-10-CM | POA: Diagnosis not present

## 2022-08-30 DIAGNOSIS — R945 Abnormal results of liver function studies: Secondary | ICD-10-CM | POA: Diagnosis not present

## 2022-08-30 DIAGNOSIS — I774 Celiac artery compression syndrome: Secondary | ICD-10-CM | POA: Diagnosis not present

## 2022-08-30 LAB — LAB REPORT - SCANNED: EGFR: 103

## 2022-08-30 LAB — HM HEPATITIS C SCREENING LAB: HM Hepatitis Screen: NEGATIVE

## 2022-09-01 DIAGNOSIS — J4520 Mild intermittent asthma, uncomplicated: Secondary | ICD-10-CM | POA: Diagnosis not present

## 2022-09-01 DIAGNOSIS — R0789 Other chest pain: Secondary | ICD-10-CM | POA: Diagnosis not present

## 2022-09-01 DIAGNOSIS — Z79899 Other long term (current) drug therapy: Secondary | ICD-10-CM | POA: Diagnosis not present

## 2022-09-01 DIAGNOSIS — Z743 Need for continuous supervision: Secondary | ICD-10-CM | POA: Diagnosis not present

## 2022-09-01 DIAGNOSIS — R1084 Generalized abdominal pain: Secondary | ICD-10-CM | POA: Diagnosis not present

## 2022-09-01 DIAGNOSIS — R079 Chest pain, unspecified: Secondary | ICD-10-CM | POA: Diagnosis not present

## 2022-09-01 DIAGNOSIS — I771 Stricture of artery: Secondary | ICD-10-CM | POA: Diagnosis not present

## 2022-09-01 DIAGNOSIS — R531 Weakness: Secondary | ICD-10-CM | POA: Diagnosis not present

## 2022-09-01 DIAGNOSIS — R9431 Abnormal electrocardiogram [ECG] [EKG]: Secondary | ICD-10-CM | POA: Diagnosis not present

## 2022-09-01 DIAGNOSIS — I774 Celiac artery compression syndrome: Secondary | ICD-10-CM | POA: Diagnosis not present

## 2022-09-03 ENCOUNTER — Telehealth: Payer: Self-pay | Admitting: Gastroenterology

## 2022-09-03 DIAGNOSIS — R109 Unspecified abdominal pain: Secondary | ICD-10-CM

## 2022-09-03 DIAGNOSIS — I774 Celiac artery compression syndrome: Secondary | ICD-10-CM | POA: Diagnosis not present

## 2022-09-03 DIAGNOSIS — K581 Irritable bowel syndrome with constipation: Secondary | ICD-10-CM

## 2022-09-03 DIAGNOSIS — G8929 Other chronic pain: Secondary | ICD-10-CM

## 2022-09-03 DIAGNOSIS — I771 Stricture of artery: Secondary | ICD-10-CM | POA: Diagnosis not present

## 2022-09-03 NOTE — Telephone Encounter (Signed)
[facility] called regarding patient's CT scan results. Would like to discuss these results. Please call Gaye Alken [phone] to discuss. Thank you.

## 2022-09-06 ENCOUNTER — Telehealth: Payer: Self-pay

## 2022-09-06 NOTE — Telephone Encounter (Signed)
Amy Waynetta Sandy would like Dr Chales Abrahams to call to discuss this pt.  Please call [phone]. CT scan was faxed last week for review.  This is not urgent and can wait for Dr Chales Abrahams to return to the office

## 2022-09-06 NOTE — Telephone Encounter (Signed)
        Patient  visited [facility] on 09/01/2022  for treatment.   Telephone encounter attempt :  1st  A HIPAA compliant voice message was left requesting a return call.  Instructed patient to call back at [phone].   [name] [name]  [facility] Population Health Community Resource Care Guide   [name]  ?? [phone]   Website: [email or web address]  [email or web address]    

## 2022-09-07 ENCOUNTER — Telehealth: Payer: Self-pay

## 2022-09-07 NOTE — Telephone Encounter (Signed)
        Patient  visited [facility] on 09/01/2022  for treatment.   Telephone encounter attempt :  2nd  A HIPAA compliant voice message was left requesting a return call.  Instructed patient to call back at [phone].   Naava Janeway [facility]  [facility] Population Health Community Resource Care Guide   [email or web address]  ?? [phone]   Website: [email or web address]  [email or web address]

## 2022-09-07 NOTE — Telephone Encounter (Signed)
CT AP with contrast 08/26/2022 at [facility] -No acute abn -Appears to >50% narrowing Px celiac. SMA, IMA patent. No atherosclerosis  LFTs  07/2022: AST 62, ALT 83 08/24/2022: AST 74, ALT 102 08/30/2022: AST 26, ALT 48. Nl CBC 09/01/2022: AST 46, ALT 56  Neg acute hep panel (4/8)   Plan: -Mesenteric doppler. (For ?celiac artery compression syndrome)- ATTN: Dr Fredia Sorrow (need inspiratory/expiratory)- in [facility], if possible. -She has ref to vascular Sx (Amy Waynetta Sandy has made appt) -Check autoimmune hepatitis panel (AMA, ASMA), ANA, iron studies,serum ceruloplasmin, A1AT, celiac screen and GGT. -Check anti-HAV total Ab and HBsAb.  If negative, would recommend vaccination for hepatitis A and B.  Please arrange for all above tests at [facility] d/t transportation issues  RG   RG

## 2022-09-08 ENCOUNTER — Other Ambulatory Visit: Payer: Self-pay

## 2022-09-08 DIAGNOSIS — G8929 Other chronic pain: Secondary | ICD-10-CM

## 2022-09-08 DIAGNOSIS — R109 Unspecified abdominal pain: Secondary | ICD-10-CM

## 2022-09-08 NOTE — Telephone Encounter (Signed)
The pt has been advised that the order has been faxed to [facility] for labs and [place].  The pt will also call to set up at her convenience.I have asked that all results get faxed to Dr Urban Gibson attention.

## 2022-09-09 NOTE — Telephone Encounter (Signed)
Dr Chales Abrahams pt will route to [facility]

## 2022-09-09 NOTE — Telephone Encounter (Addendum)
Patient called requesting to speak with a nurse regarding the MyChart message said she was only suppose to get an [place] and she is seeing two orders US\MRI.

## 2022-09-09 NOTE — Telephone Encounter (Signed)
See My Chart message sent to the pt 4/18

## 2022-09-09 NOTE — Telephone Encounter (Signed)
Patient called states she spoke with her PCP office this morning regarding the orders but they advise her they do not have anything as of yet . Also wants to discuss some stomach issues she is having.

## 2022-09-10 NOTE — Telephone Encounter (Signed)
Pt aware that only mesenteric doppler and labs have been ordered

## 2022-09-14 NOTE — Telephone Encounter (Signed)
Inbound call from patient,  states [facility] has not received any orders provided fax.  [phone].

## 2022-09-15 DIAGNOSIS — R109 Unspecified abdominal pain: Secondary | ICD-10-CM | POA: Diagnosis not present

## 2022-09-15 DIAGNOSIS — R748 Abnormal levels of other serum enzymes: Secondary | ICD-10-CM | POA: Diagnosis not present

## 2022-09-15 DIAGNOSIS — G8929 Other chronic pain: Secondary | ICD-10-CM | POA: Diagnosis not present

## 2022-09-16 ENCOUNTER — Telehealth: Payer: Self-pay | Admitting: Gastroenterology

## 2022-09-16 NOTE — Telephone Encounter (Signed)
Ultrasound & lab orders faxed to number provided by patient. Patient notified via mychart of update.

## 2022-09-16 NOTE — Telephone Encounter (Signed)
Spoke with patient & advised her that orders have been faxed to the fax number she provided both electronically and a hard copy. She already had labs completed at her PCP office & they are aware to send results to Dr. Chales Abrahams. She plans to call [facility] tomorrow to ensure they received orders & to scheduler her appointment. Advised her to call back with any further questions/concerns.

## 2022-09-16 NOTE — Telephone Encounter (Signed)
Patient is calling states there has been a bunch of confusion regarding faxes and things requesting a call back. Please advise

## 2022-09-20 NOTE — Telephone Encounter (Signed)
Pt stated that she has not heard anything on scheduling her ultrasound from [place]: Pt  chart was reviewed and noted that orders were faxed on on Thursday afternoon.  Pt made aware.  Pt verbalized understanding with all questions answered.

## 2022-09-20 NOTE — Telephone Encounter (Signed)
PT is calling to get an update on the information that was to be faxed.. She still has not heard anything on scheduling ultrasound. Please advise.

## 2022-09-21 NOTE — Telephone Encounter (Signed)
PT is calling about the orders that should be faxed to [facility]. She stated that we are not sending order from Dr. Chales Abrahams but yet orders done by her pcp. I asked to confirm fax number but she doesn't remember. I was able to confirm that imaging orders should be sent to [phone]. PT is requesting call back

## 2022-09-22 NOTE — Telephone Encounter (Signed)
Order for Mesenteric doppler. (For ?celiac artery compression syndrome)- ATTN: Dr Fredia Sorrow (need inspiratory/expiratory)- in [place], if possible.            Was faxed to [facility] Please see note below from pt and advise.

## 2022-09-22 NOTE — Telephone Encounter (Signed)
Pt was made aware that a new order has been faxed to [facility] for her [place]. Pt verbalized understanding with all questions answered.

## 2022-09-22 NOTE — Telephone Encounter (Signed)
[facility] hospital called to advise they do not do the test you ordered.

## 2022-09-24 DIAGNOSIS — J4520 Mild intermittent asthma, uncomplicated: Secondary | ICD-10-CM | POA: Diagnosis not present

## 2022-09-24 NOTE — Telephone Encounter (Signed)
Patient calling looking to speak with a nurse to follow up on results from recent lab work. Please advise

## 2022-09-24 NOTE — Telephone Encounter (Signed)
Spoke with PCP office & they stated they have the orders for labs, however patient did not complete labs at their office. Patient planned to have them done at [facility] instead. Called lab at the hospital and they are going to fax over the results.

## 2022-09-29 DIAGNOSIS — F411 Generalized anxiety disorder: Secondary | ICD-10-CM | POA: Diagnosis not present

## 2022-10-06 DIAGNOSIS — F411 Generalized anxiety disorder: Secondary | ICD-10-CM | POA: Diagnosis not present

## 2022-10-20 DIAGNOSIS — F411 Generalized anxiety disorder: Secondary | ICD-10-CM | POA: Diagnosis not present

## 2022-10-22 DIAGNOSIS — M797 Fibromyalgia: Secondary | ICD-10-CM | POA: Diagnosis not present

## 2022-10-22 DIAGNOSIS — R109 Unspecified abdominal pain: Secondary | ICD-10-CM | POA: Diagnosis not present

## 2022-10-22 DIAGNOSIS — R636 Underweight: Secondary | ICD-10-CM | POA: Diagnosis not present

## 2022-10-22 DIAGNOSIS — R531 Weakness: Secondary | ICD-10-CM | POA: Diagnosis not present

## 2022-10-22 DIAGNOSIS — R748 Abnormal levels of other serum enzymes: Secondary | ICD-10-CM | POA: Diagnosis not present

## 2022-10-22 DIAGNOSIS — K219 Gastro-esophageal reflux disease without esophagitis: Secondary | ICD-10-CM | POA: Diagnosis not present

## 2022-10-22 DIAGNOSIS — F419 Anxiety disorder, unspecified: Secondary | ICD-10-CM | POA: Diagnosis not present

## 2022-10-25 DIAGNOSIS — J4520 Mild intermittent asthma, uncomplicated: Secondary | ICD-10-CM | POA: Diagnosis not present

## 2022-10-27 DIAGNOSIS — F411 Generalized anxiety disorder: Secondary | ICD-10-CM | POA: Diagnosis not present

## 2022-11-03 DIAGNOSIS — F411 Generalized anxiety disorder: Secondary | ICD-10-CM | POA: Diagnosis not present

## 2022-11-11 DIAGNOSIS — F411 Generalized anxiety disorder: Secondary | ICD-10-CM | POA: Diagnosis not present

## 2022-11-17 DIAGNOSIS — F411 Generalized anxiety disorder: Secondary | ICD-10-CM | POA: Diagnosis not present

## 2022-11-23 DIAGNOSIS — F411 Generalized anxiety disorder: Secondary | ICD-10-CM | POA: Diagnosis not present

## 2022-11-24 DIAGNOSIS — H5213 Myopia, bilateral: Secondary | ICD-10-CM | POA: Diagnosis not present

## 2022-11-24 DIAGNOSIS — J4520 Mild intermittent asthma, uncomplicated: Secondary | ICD-10-CM | POA: Diagnosis not present

## 2022-11-24 DIAGNOSIS — H52223 Regular astigmatism, bilateral: Secondary | ICD-10-CM | POA: Diagnosis not present

## 2022-11-24 DIAGNOSIS — H25813 Combined forms of age-related cataract, bilateral: Secondary | ICD-10-CM | POA: Diagnosis not present

## 2022-11-24 DIAGNOSIS — Z0100 Encounter for examination of eyes and vision without abnormal findings: Secondary | ICD-10-CM | POA: Diagnosis not present

## 2022-11-29 ENCOUNTER — Encounter: Payer: Self-pay | Admitting: Gastroenterology

## 2022-11-29 ENCOUNTER — Other Ambulatory Visit (INDEPENDENT_AMBULATORY_CARE_PROVIDER_SITE_OTHER): Payer: Medicare HMO

## 2022-11-29 ENCOUNTER — Ambulatory Visit (INDEPENDENT_AMBULATORY_CARE_PROVIDER_SITE_OTHER): Payer: Medicare HMO | Admitting: Gastroenterology

## 2022-11-29 VITALS — BP 118/70 | HR 95 | Ht <= 58 in | Wt 72.0 lb

## 2022-11-29 DIAGNOSIS — G8929 Other chronic pain: Secondary | ICD-10-CM | POA: Diagnosis not present

## 2022-11-29 DIAGNOSIS — K219 Gastro-esophageal reflux disease without esophagitis: Secondary | ICD-10-CM

## 2022-11-29 DIAGNOSIS — R102 Pelvic and perineal pain: Secondary | ICD-10-CM | POA: Diagnosis not present

## 2022-11-29 DIAGNOSIS — R7989 Other specified abnormal findings of blood chemistry: Secondary | ICD-10-CM | POA: Diagnosis not present

## 2022-11-29 DIAGNOSIS — K581 Irritable bowel syndrome with constipation: Secondary | ICD-10-CM | POA: Diagnosis not present

## 2022-11-29 DIAGNOSIS — R109 Unspecified abdominal pain: Secondary | ICD-10-CM

## 2022-11-29 LAB — CBC WITH DIFFERENTIAL/PLATELET
Basophils Absolute: 0 10*3/uL (ref 0.0–0.1)
Basophils Relative: 0.5 % (ref 0.0–3.0)
Eosinophils Absolute: 0.1 10*3/uL (ref 0.0–0.7)
Eosinophils Relative: 1.3 % (ref 0.0–5.0)
HCT: 37.4 % (ref 36.0–46.0)
Hemoglobin: 12.2 g/dL (ref 12.0–15.0)
Lymphocytes Relative: 27 % (ref 12.0–46.0)
Lymphs Abs: 1.4 10*3/uL (ref 0.7–4.0)
MCHC: 32.7 g/dL (ref 30.0–36.0)
MCV: 91.5 fl (ref 78.0–100.0)
Monocytes Absolute: 0.7 10*3/uL (ref 0.1–1.0)
Monocytes Relative: 13.3 % — ABNORMAL HIGH (ref 3.0–12.0)
Neutro Abs: 2.9 10*3/uL (ref 1.4–7.7)
Neutrophils Relative %: 57.9 % (ref 43.0–77.0)
Platelets: 282 10*3/uL (ref 150.0–400.0)
RBC: 4.09 Mil/uL (ref 3.87–5.11)
RDW: 12.3 % (ref 11.5–15.5)
WBC: 5 10*3/uL (ref 4.0–10.5)

## 2022-11-29 LAB — COMPREHENSIVE METABOLIC PANEL
ALT: 32 U/L (ref 0–35)
AST: 30 U/L (ref 0–37)
Albumin: 4.3 g/dL (ref 3.5–5.2)
Alkaline Phosphatase: 73 U/L (ref 39–117)
BUN: 8 mg/dL (ref 6–23)
CO2: 30 mEq/L (ref 19–32)
Calcium: 10 mg/dL (ref 8.4–10.5)
Chloride: 98 mEq/L (ref 96–112)
Creatinine, Ser: 0.66 mg/dL (ref 0.40–1.20)
GFR: 95.64 mL/min (ref >60.00)
Glucose, Bld: 95 mg/dL (ref 70–99)
Potassium: 4.1 mEq/L (ref 3.5–5.1)
Sodium: 138 mEq/L (ref 135–145)
Total Bilirubin: 0.4 mg/dL (ref 0.2–1.2)
Total Protein: 8.1 g/dL (ref 6.0–8.3)

## 2022-11-29 NOTE — Patient Instructions (Signed)
_______________________________________________________  If your blood pressure at your visit was 140/90 or greater, please contact your primary care physician to follow up on this.  _______________________________________________________  If you are age 60 or older, your body mass index should be between 23-30. Your Body mass index is 16.14 kg/m. If this is out of the aforementioned range listed, please consider follow up with your Primary Care Provider.  If you are age 57 or younger, your body mass index should be between 19-25. Your Body mass index is 16.14 kg/m. If this is out of the aformentioned range listed, please consider follow up with your Primary Care Provider.   ________________________________________________________  The [facility] providers would like to encourage you to use [facility] to communicate with providers for non-urgent requests or questions.  Due to long hold times on the telephone, sending your provider a message by [facility] may be a faster and more efficient way to get a response.  Please allow 48 business hours for a response.  Please remember that this is for non-urgent requests.  _______________________________________________________  Your provider has requested that you go to the basement level for lab work before leaving today. Press "B" on the elevator. The lab is located at the first door on the left as you exit the elevator.  -Small but more frequent meals.  -Continue pepcid PRN  Thank you,  Dr. Lynann Bologna

## 2022-11-29 NOTE — Progress Notes (Signed)
Chief Complaint: FU   Referring Provider:  Dr Tomasa Blase        ASSESSMENT AND PLAN;    #1. IBS-C. H/O fecal impaction s/p manual disimpaction 05/2020.  Subsequent rectal exams neg.  Stool soft heme-neg. Neg stool studies, virtual colon 11/2017. Failed bentyl, amitriptyline. Refuses colon. Neg cologuard 08/2021  #2. Chronic abdo/pelvic pain (since hysterectomy 04/2016). Dx with IC by urology, fibromyalgia/anxiety/depression/panic attacks/conversion disorder/pseudoseizures. Multiple ED visits. Multiple neg CT scans A/P- 10/2021, 07/2021, 03/28/2019, 03/25/2019, 02/14/2019, 12/2016, 07/2017, 12/2018.   #3. 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 Better. She held off on mesenteric doppler      #3. GERD with neg Ba Swallow 04/2018.  Neg EGD 04/24/2019, 02/2020. Failed carafate, GI coctail, protonix, omeprazole, nexium (caused burning) ("can't do PPIs").   #4. SIGNIFICANT ANXIETY/DEPRESSION/PANIC DISORDER/CONVERSION DISORDER/PTSD  #5.  Abn Lfts. Neg extensive WU. Not immune to A/B- refuses vaccine  #6. 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    Plan:   -CBC, CMP -Small but more frequent meals.  -Reassured pt -Continue pepcid PRN -No GI intervention planned at this time. -If any further UGI problems, would consider solid-phase gastric emptying scan or mesenteric doppler (with ins/exp phase)   HPI:     Donna Francis is a 62 y.o. female  With fibromyalgia, anxiety/depression, costochondritis  FU   Better now  Can only eat chicken, seafood, greens, rice, fruits like berries. Chobani yogburt. Couldnot handle boost/ensure. Better with pepcid PRN  Eating 5-6 meals a day.  Gen abdo pain with neg recent CT AP with contrast 10/2021  Bms - now 1/day, still constipated, but has to push at times. After hystrectomy and EGD.  Occasional pellet-like stools.  Brings in several pictures-some with mushy bowel movements and some pellet-like stools.  Better  with increased water intake.  At times would have crampy nonspecific abdominal pain attributed to spasms.  These do get better with defecation.   Wt Readings from Last 3 Encounters:  11/29/22 72 lb (32.7 kg)  07/14/22 73 lb 14.4 oz (33.5 kg)  06/11/22 73 lb (33.1 kg)   Her cardiology work-up has been neg.  She has done well with the behavioral therapy.   Daughter with ETOH pancreatitis Sister has IBS at [place].  Now diagnosed with epigastric hernia.   Prev GI WU  CT AP with contrast 08/26/2022 at [facility] -No acute abn -Appears to >50% narrowing Px celiac. SMA, IMA patent. No atherosclerosis   H/O abn LFTs:  LFTs  07/2022: AST 62, ALT 83 08/24/2022: AST 74, ALT 102 08/30/2022: AST 26, ALT 48. Nl CBC 09/01/2022: AST 46, ALT 56 Neg autoimmune hepatitis panel (AMA, ASMA), ANA, iron studies,serum ceruloplasmin, A1AT, celiac screen and GGT.  Not immune to A/B- refuses vaccine  -occ dysphagia. S/P EGD by Dr Judie Petit Oct 2021 at [facility] while inpt. Has problems waking up. Thereafter, had dysphagia (with solids and liquids) and odynophagia and abdo pain d/t spasms. Convinced that most of problems are related to recent EGD  -Chronic abd pain with associated nausea but no vomiting, feeling dizzy, weakness, occasional shortness of breath, fatigue, headaches, palpitations, back pain, belching, unexplained weight loss, "a lot of gas"  -Can only eat chicken, sea food, berries, apple sauce. Pediasure didn't work.  Tolerating small meals.   -S/p EGD 04/24/2019 showing mild gastritis.  Biopsies were negative for H. Pylori.  SB Bx- neg for celiac disease.  Intolerance to multiple PPIs.  -She is under care of [name] -  psychologist in [place].  She really likes her.  Situational anxiety is getting under control.  -All her symptoms started after she had a hysterectomy 04/2016.  She has been seen by GYN at [facility].  I have reviewed the notes in care everywhere.  It has been recommended to start pelvic physical  therapy.   -Refuses colonoscopy. Neg cologuard 08/2021: neg  Wt Readings from Last 3 Encounters:  11/29/22 72 lb (32.7 kg)  07/14/22 73 lb 14.4 oz (33.5 kg)  06/11/22 73 lb (33.1 kg)   CT AP with contrast 10/2021 IMPRESSION: No acute abnormality identified in the abdomen or pelvis.      Past Medical History:  Diagnosis Date   Anxiety     Colitis     IBS (irritable bowel syndrome)     Varicose veins of bilateral lower extremities with pain             Past Surgical History:  Procedure Laterality Date   ABDOMINAL HYSTERECTOMY   04/2016   CESAREAN SECTION        x 4   COLONOSCOPY   1995    in [place]           Family History  Problem Relation Age of Onset   Pancreatic cancer Mother        Social History         Tobacco Use   Smoking status: Never Smoker   Smokeless tobacco: Never Used  Substance Use Topics   Alcohol use: Yes      Comment: minimal   Drug use: No            Current Outpatient Medications  Medication Sig Dispense Refill   ALPRAZolam (XANAX) 0.5 MG tablet Take 1 mg by mouth 4 times        Probiotic Product (PROBIOTIC DAILY PO) Take by mouth daily.        No current facility-administered medications for this visit.       No Known Allergies   Review of Systems:  Psychiatric/Behavioral:  Has anxiety or depression       Physical Exam:    Vitals:   11/29/22 1125  Weight: 72 lb (32.7 kg)  Height: 4\' 8"  (1.422 m)   Blood pressure 146/80, pulse 100/min  Gen: awake, alert, NAD HEENT: anicteric, no pallor CV: RRR, no mrg Pulm: CTA b/l Abd: soft, NT/ND, +BS throughout Ext: no c/c/e Neuro: nonfocal     Latest Ref Rng & Units 10/29/2021    3:01 PM 03/22/2021    2:26 AM 04/07/2019    6:07 PM  CBC  WBC 4.0 - 10.5 K/uL 4.4  4.3  4.2   Hemoglobin 12.0 - 15.0 g/dL 78.2  95.6  21.3   Hematocrit 36.0 - 46.0 % 36.5  40.4  37.7   Platelets 150 - 400 K/uL 263  302  326       Latest Ref Rng & Units 10/29/2021    3:01 PM 03/22/2021    2:26  AM 04/07/2019    6:07 PM  CMP  Glucose 70 - 99 mg/dL 77  92  95   BUN 6 - 20 mg/dL 8  8  <5   Creatinine 0.86 - 1.00 mg/dL 5.78  4.69  6.29   Sodium 135 - 145 mmol/L 139  132  136   Potassium 3.5 - 5.1 mmol/L 3.7  4.3  3.4   Chloride 98 - 111 mmol/L 107  93  99  CO2 22 - 32 mmol/L 25  29  26    Calcium 8.9 - 10.3 mg/dL 8.8  57.8  9.5   Total Protein 6.5 - 8.1 g/dL 7.2  8.4  7.8   Total Bilirubin 0.3 - 1.2 mg/dL 0.6  0.6  0.6   Alkaline Phos 38 - 126 U/L 57  61  51   AST 15 - 41 U/L 23  23  22    ALT 0 - 44 U/L 25  21  20         Edman Circle, MD   Cc: Dr Tomasa Blase

## 2022-12-01 NOTE — Telephone Encounter (Signed)
Inbound call from patient requesting to speak with nurse in regards to results. Please advise.

## 2022-12-02 NOTE — Telephone Encounter (Signed)
Patient called in very anxious about her lab work. She was anxious as to why Dr. Chales Abrahams ordered both a CBC & CMP, and that one of her labs (monocytes) came back elevated. Confused why results are being sent to her without MD review first. Spent about 20 minutes on the phone with patient educating her on the labs & what they were looking for. Advised that unfortunately it is out of our hands & that Mychart is set up to send these results prior to MD review. States she understands, but just a very anxious patient d/t her medical history. Advised her I'd make Dr. Chales Abrahams aware, but that it can take a few days for labs to be reviewed. Tried to calm her nerves. Advised I'd be in touch once I hear from him. Pt verbalized all understanding.

## 2022-12-03 DIAGNOSIS — F411 Generalized anxiety disorder: Secondary | ICD-10-CM | POA: Diagnosis not present

## 2022-12-03 NOTE — Telephone Encounter (Signed)
Left message for patient to call back  

## 2022-12-03 NOTE — Telephone Encounter (Signed)
All the labs are fine Monocytes are totally nonspecific and are upper limit of normal Nothing to be worried about Please send copy to PCP RG

## 2022-12-08 DIAGNOSIS — F411 Generalized anxiety disorder: Secondary | ICD-10-CM | POA: Diagnosis not present

## 2022-12-15 DIAGNOSIS — F411 Generalized anxiety disorder: Secondary | ICD-10-CM | POA: Diagnosis not present

## 2022-12-22 DIAGNOSIS — F411 Generalized anxiety disorder: Secondary | ICD-10-CM | POA: Diagnosis not present

## 2022-12-23 DIAGNOSIS — F419 Anxiety disorder, unspecified: Secondary | ICD-10-CM | POA: Diagnosis not present

## 2022-12-23 DIAGNOSIS — K582 Mixed irritable bowel syndrome: Secondary | ICD-10-CM | POA: Diagnosis not present

## 2022-12-23 DIAGNOSIS — R748 Abnormal levels of other serum enzymes: Secondary | ICD-10-CM | POA: Diagnosis not present

## 2022-12-23 DIAGNOSIS — R636 Underweight: Secondary | ICD-10-CM | POA: Diagnosis not present

## 2022-12-23 DIAGNOSIS — M797 Fibromyalgia: Secondary | ICD-10-CM | POA: Diagnosis not present

## 2022-12-23 DIAGNOSIS — R531 Weakness: Secondary | ICD-10-CM | POA: Diagnosis not present

## 2022-12-29 DIAGNOSIS — F411 Generalized anxiety disorder: Secondary | ICD-10-CM | POA: Diagnosis not present

## 2023-01-05 DIAGNOSIS — F411 Generalized anxiety disorder: Secondary | ICD-10-CM | POA: Diagnosis not present

## 2023-01-07 DIAGNOSIS — R636 Underweight: Secondary | ICD-10-CM | POA: Diagnosis not present

## 2023-01-07 DIAGNOSIS — E46 Unspecified protein-calorie malnutrition: Secondary | ICD-10-CM | POA: Diagnosis not present

## 2023-01-17 DIAGNOSIS — Z7689 Persons encountering health services in other specified circumstances: Secondary | ICD-10-CM | POA: Diagnosis not present

## 2023-01-17 DIAGNOSIS — R531 Weakness: Secondary | ICD-10-CM | POA: Diagnosis not present

## 2023-01-17 DIAGNOSIS — R109 Unspecified abdominal pain: Secondary | ICD-10-CM | POA: Diagnosis not present

## 2023-01-19 DIAGNOSIS — F411 Generalized anxiety disorder: Secondary | ICD-10-CM | POA: Diagnosis not present

## 2023-01-26 DIAGNOSIS — F411 Generalized anxiety disorder: Secondary | ICD-10-CM | POA: Diagnosis not present

## 2023-01-27 DIAGNOSIS — F411 Generalized anxiety disorder: Secondary | ICD-10-CM | POA: Diagnosis not present

## 2023-02-02 DIAGNOSIS — F411 Generalized anxiety disorder: Secondary | ICD-10-CM | POA: Diagnosis not present

## 2023-02-03 DIAGNOSIS — I771 Stricture of artery: Secondary | ICD-10-CM | POA: Diagnosis not present

## 2023-02-03 DIAGNOSIS — R5383 Other fatigue: Secondary | ICD-10-CM | POA: Diagnosis not present

## 2023-02-03 DIAGNOSIS — R5381 Other malaise: Secondary | ICD-10-CM | POA: Diagnosis not present

## 2023-02-03 DIAGNOSIS — R109 Unspecified abdominal pain: Secondary | ICD-10-CM | POA: Diagnosis not present

## 2023-02-03 DIAGNOSIS — M797 Fibromyalgia: Secondary | ICD-10-CM | POA: Diagnosis not present

## 2023-02-03 DIAGNOSIS — R945 Abnormal results of liver function studies: Secondary | ICD-10-CM | POA: Diagnosis not present

## 2023-02-03 DIAGNOSIS — R0789 Other chest pain: Secondary | ICD-10-CM | POA: Diagnosis not present

## 2023-02-09 DIAGNOSIS — F411 Generalized anxiety disorder: Secondary | ICD-10-CM | POA: Diagnosis not present

## 2023-02-15 DIAGNOSIS — R109 Unspecified abdominal pain: Secondary | ICD-10-CM | POA: Diagnosis not present

## 2023-02-15 DIAGNOSIS — R1084 Generalized abdominal pain: Secondary | ICD-10-CM | POA: Diagnosis not present

## 2023-02-16 DIAGNOSIS — F411 Generalized anxiety disorder: Secondary | ICD-10-CM | POA: Diagnosis not present

## 2023-03-02 DIAGNOSIS — F411 Generalized anxiety disorder: Secondary | ICD-10-CM | POA: Diagnosis not present

## 2023-03-03 DIAGNOSIS — J4520 Mild intermittent asthma, uncomplicated: Secondary | ICD-10-CM | POA: Diagnosis not present

## 2023-03-04 DIAGNOSIS — E785 Hyperlipidemia, unspecified: Secondary | ICD-10-CM | POA: Diagnosis not present

## 2023-03-04 DIAGNOSIS — R636 Underweight: Secondary | ICD-10-CM | POA: Diagnosis not present

## 2023-03-05 LAB — LAB REPORT - SCANNED: EGFR: 100

## 2023-03-08 DIAGNOSIS — E46 Unspecified protein-calorie malnutrition: Secondary | ICD-10-CM | POA: Diagnosis not present

## 2023-03-08 DIAGNOSIS — E538 Deficiency of other specified B group vitamins: Secondary | ICD-10-CM | POA: Diagnosis not present

## 2023-03-08 DIAGNOSIS — R636 Underweight: Secondary | ICD-10-CM | POA: Diagnosis not present

## 2023-03-08 DIAGNOSIS — Z681 Body mass index (BMI) 19 or less, adult: Secondary | ICD-10-CM | POA: Diagnosis not present

## 2023-03-09 ENCOUNTER — Telehealth: Payer: Self-pay | Admitting: Gastroenterology

## 2023-03-09 DIAGNOSIS — F411 Generalized anxiety disorder: Secondary | ICD-10-CM | POA: Diagnosis not present

## 2023-03-09 NOTE — Telephone Encounter (Signed)
Donna Francis is not here today. Can you talk to her regarding her enzymes? Thanks.

## 2023-03-09 NOTE — Telephone Encounter (Signed)
Inbound call from patient stating that her liver enzymes are high and is requesting to speak with Nehemiah Settle. Patient states she needs to figure out what is going on. Please advise.

## 2023-03-10 NOTE — Telephone Encounter (Signed)
Patient is very concerned over recent lab work (elevated LFT's) & [place]. She's requesting that Dr. Chales Abrahams review. Fax number provided to patient & advised that she have office fax results to Dr. Chales Abrahams for review. Pt verbalized all understanding.

## 2023-03-10 NOTE — Telephone Encounter (Signed)
Inbound call from patient requesting a call back as soon as possible regarding liver enzymes. Patient does not wish to speak with Viviann Spare, requesting to speak with Nehemiah Settle or Glendora Score. Please advise, thank you.

## 2023-03-11 NOTE — Telephone Encounter (Signed)
Inbound call from patient requesting a call to receive an update regarding previous notes. Please advise, thank you.

## 2023-03-11 NOTE — Telephone Encounter (Signed)
Spoke with Mayme Genta with PCP office & she stated labs and Korea were faxed today. Fax has not been received yet. Pt made aware & advised that we will continue to look out for fax. She is very anxious & feels that she can't wait any longer & may decide to go to ED if she feels necessary d/t her anxiety. States everytime her LFT's are elevated she feels "weird and all kind of things happen" including nausea, dizziness, irritabilty, etc. Will route to [place], RN & Nehemiah Settle, CMA to keep a look out for fax from PCP office.

## 2023-03-11 NOTE — Telephone Encounter (Signed)
Copy of labs placed in Dr. Urban Gibson inbox. Waiting on [place] results.

## 2023-03-12 DIAGNOSIS — I517 Cardiomegaly: Secondary | ICD-10-CM | POA: Diagnosis not present

## 2023-03-12 DIAGNOSIS — Z90710 Acquired absence of both cervix and uterus: Secondary | ICD-10-CM | POA: Diagnosis not present

## 2023-03-12 DIAGNOSIS — R9431 Abnormal electrocardiogram [ECG] [EKG]: Secondary | ICD-10-CM | POA: Diagnosis not present

## 2023-03-12 DIAGNOSIS — F419 Anxiety disorder, unspecified: Secondary | ICD-10-CM | POA: Diagnosis not present

## 2023-03-12 DIAGNOSIS — R109 Unspecified abdominal pain: Secondary | ICD-10-CM | POA: Diagnosis not present

## 2023-03-12 DIAGNOSIS — R7989 Other specified abnormal findings of blood chemistry: Secondary | ICD-10-CM | POA: Diagnosis not present

## 2023-03-12 DIAGNOSIS — Z743 Need for continuous supervision: Secondary | ICD-10-CM | POA: Diagnosis not present

## 2023-03-12 DIAGNOSIS — Z79899 Other long term (current) drug therapy: Secondary | ICD-10-CM | POA: Diagnosis not present

## 2023-03-12 DIAGNOSIS — R079 Chest pain, unspecified: Secondary | ICD-10-CM | POA: Diagnosis not present

## 2023-03-12 DIAGNOSIS — I700 Atherosclerosis of aorta: Secondary | ICD-10-CM | POA: Diagnosis not present

## 2023-03-12 DIAGNOSIS — K5900 Constipation, unspecified: Secondary | ICD-10-CM | POA: Diagnosis not present

## 2023-03-12 DIAGNOSIS — R1084 Generalized abdominal pain: Secondary | ICD-10-CM | POA: Diagnosis not present

## 2023-03-12 DIAGNOSIS — R748 Abnormal levels of other serum enzymes: Secondary | ICD-10-CM | POA: Diagnosis not present

## 2023-03-12 LAB — BASIC METABOLIC PANEL: EGFR: 60

## 2023-03-16 DIAGNOSIS — F411 Generalized anxiety disorder: Secondary | ICD-10-CM | POA: Diagnosis not present

## 2023-03-17 NOTE — Telephone Encounter (Signed)
[place] results received & placed on Dr. Urban Gibson desk for review.

## 2023-03-17 NOTE — Telephone Encounter (Signed)
Called & spoke with [facility] with PCP office and requested fax of [place] results. Fax being sent to [phone].

## 2023-03-18 ENCOUNTER — Other Ambulatory Visit: Payer: Self-pay

## 2023-03-18 ENCOUNTER — Telehealth: Payer: Self-pay | Admitting: Gastroenterology

## 2023-03-18 DIAGNOSIS — R7989 Other specified abnormal findings of blood chemistry: Secondary | ICD-10-CM

## 2023-03-18 NOTE — Telephone Encounter (Signed)
LFTs reviewed From 03/10/2023: AST 49, ALT 62 From 03/12/2023 AST 60, ALT 93.  Normal alk phos 73, normal albumin 4.5 Ultrasound 02/15/2023: Negative.  CBD normal.  Liver normal. CTA 08/2022 negative. Also abdominal ultrasound 09/2021 negative  She has fluctuating liver function tests in past as well  Plan: -Stop any herbal supplements -Check acute hepatitis panel, AMA, anti-smooth muscle antibody, serum ceruloplasmin, iron studies, alpha 1 antitrypsin. -Check HBsAb titer and HAV total Ab.  If neg, would recommend vaccination for A and B. -Recheck LFTs in 2 weeks -If still with problems, will perform further workup. Liver Bx as a last resort.

## 2023-03-18 NOTE — Telephone Encounter (Signed)
Spoke with patient regarding MD recommendations. She would prefer to have labs drawn at [facility], orders faxed to their lab. She will complete in 2 weeks. We also discussed her concerns for constipation. She has been in touch with PCP regarding this as well. She stated she tried to stick her finger up her rectum to remove stool. Advised against this d/t potential trauma this could cause. Pt advised to continue to follow up with PCP. Advised she could also try miralax daily. Pt will call back with any further questions/concerns.

## 2023-03-18 NOTE — Telephone Encounter (Signed)
Please call patient.  She is complaining about not being able to have a bowel movement and "it's driving her crazy!!"  She asked if you would to please call her today.  Please advise.  Thank you.

## 2023-03-18 NOTE — Telephone Encounter (Signed)
Just spoke with this patient a little over an hour ago (refer back to phone note 03/09/23) & we had a long discussion about Dr. Urban Gibson recommendations for upcoming lab work & also discussed her concerns for constipation. She has high anxiety. She states she does not tolerate miralax & that she has been taking stool softeners and enemas. She has also been attempting to remove stool with a gloved finger herself. Advised against this. Will route to MD for recommendations regarding alternatives to help with constipation.

## 2023-03-18 NOTE — Telephone Encounter (Signed)
In addition to below, avoid Tylenol RG

## 2023-03-21 DIAGNOSIS — R1084 Generalized abdominal pain: Secondary | ICD-10-CM | POA: Diagnosis not present

## 2023-03-21 DIAGNOSIS — N8189 Other female genital prolapse: Secondary | ICD-10-CM | POA: Diagnosis not present

## 2023-03-21 DIAGNOSIS — K5900 Constipation, unspecified: Secondary | ICD-10-CM | POA: Diagnosis not present

## 2023-03-21 DIAGNOSIS — N9089 Other specified noninflammatory disorders of vulva and perineum: Secondary | ICD-10-CM | POA: Diagnosis not present

## 2023-03-21 DIAGNOSIS — R748 Abnormal levels of other serum enzymes: Secondary | ICD-10-CM | POA: Diagnosis not present

## 2023-03-23 DIAGNOSIS — F411 Generalized anxiety disorder: Secondary | ICD-10-CM | POA: Diagnosis not present

## 2023-03-24 DIAGNOSIS — R7989 Other specified abnormal findings of blood chemistry: Secondary | ICD-10-CM | POA: Diagnosis not present

## 2023-03-29 ENCOUNTER — Telehealth: Payer: Self-pay | Admitting: Gastroenterology

## 2023-03-29 DIAGNOSIS — K581 Irritable bowel syndrome with constipation: Secondary | ICD-10-CM

## 2023-03-29 DIAGNOSIS — R109 Unspecified abdominal pain: Secondary | ICD-10-CM

## 2023-03-29 DIAGNOSIS — G8929 Other chronic pain: Secondary | ICD-10-CM

## 2023-03-29 NOTE — Telephone Encounter (Signed)
Inbound call from patient, requesting call back from [place] in regard to lab results from [place].

## 2023-03-30 DIAGNOSIS — F411 Generalized anxiety disorder: Secondary | ICD-10-CM | POA: Diagnosis not present

## 2023-03-30 NOTE — Telephone Encounter (Signed)
Requested labs from [facility] patient was suppose to repeat lab work in 2 weeks and I don't have the updated information yet. [place] was normal. No labs since 03-13-2023

## 2023-03-30 NOTE — Telephone Encounter (Signed)
Made patient aware regarding recent lab results and she understands  Her stomach said she is having period cramps in her stomachs that happened often and told her that can be contributed to the IBS but I will let the doctor knows  [place] Spaniel she hasn't gotten her [place] mesenteric arteries done and wants to know about this. Said that she seen a nutritionist is losing muscle mass.   Please advise

## 2023-04-01 MED ORDER — DICYCLOMINE HCL 20 MG PO TABS: 20.0000 mg | ORAL_TABLET | Freq: Two times a day (BID) | ORAL | 2 refills | Status: AC | PRN

## 2023-04-01 NOTE — Telephone Encounter (Signed)
Can use Bentyl 10 mg twice daily as needed  for abdominal cramps Can proceed with mesenteric ultrasound Doppler Good idea with nutrition-her primary care can set it up RG

## 2023-04-01 NOTE — Telephone Encounter (Signed)
Patient said that she was told to do protein powder and to do 40-50g a day and collagen per her nutritionist. So she got a plant based one and wants to know if this is ok for her and she said that it should soothe her GI tract. For the doppler she wantes to know if this is something she still get or just she wait on it since it took a while to hear about it being done. She is looking for advice on this to see if this is ok. Please advise   Will work on the doppler US and patient is aware and that she can make her own GI cocktail and will see about the last 2 labs at [facility] that hasn't been resulted.

## 2023-04-07 NOTE — Telephone Encounter (Signed)
Inbound call from patient, advised her of information below, per [place]. She states she is not  holding off on the Doppler and is still having it done on 11/25. She also states she would like to speak to [place].

## 2023-04-07 NOTE — Telephone Encounter (Signed)
Spoke to patient and she understands how important about the doppler.

## 2023-04-07 NOTE — Telephone Encounter (Signed)
OK for protein powder as long as her stomach feels okay Can hold off on Doppler for now.  If still with problems, would recommend mesenteric Doppler RG

## 2023-04-08 DIAGNOSIS — Z681 Body mass index (BMI) 19 or less, adult: Secondary | ICD-10-CM | POA: Diagnosis not present

## 2023-04-08 DIAGNOSIS — F419 Anxiety disorder, unspecified: Secondary | ICD-10-CM | POA: Diagnosis not present

## 2023-04-08 DIAGNOSIS — I774 Celiac artery compression syndrome: Secondary | ICD-10-CM | POA: Diagnosis not present

## 2023-04-08 DIAGNOSIS — R109 Unspecified abdominal pain: Secondary | ICD-10-CM | POA: Diagnosis not present

## 2023-04-08 DIAGNOSIS — R636 Underweight: Secondary | ICD-10-CM | POA: Diagnosis not present

## 2023-04-08 DIAGNOSIS — R945 Abnormal results of liver function studies: Secondary | ICD-10-CM | POA: Diagnosis not present

## 2023-04-08 DIAGNOSIS — Z79899 Other long term (current) drug therapy: Secondary | ICD-10-CM | POA: Diagnosis not present

## 2023-04-12 DIAGNOSIS — F411 Generalized anxiety disorder: Secondary | ICD-10-CM | POA: Diagnosis not present

## 2023-04-15 ENCOUNTER — Encounter: Payer: Self-pay | Admitting: Gastroenterology

## 2023-04-18 ENCOUNTER — Telehealth: Payer: Self-pay | Admitting: Cardiology

## 2023-04-18 ENCOUNTER — Ambulatory Visit (HOSPITAL_COMMUNITY)
Admission: RE | Admit: 2023-04-18 | Discharge: 2023-04-18 | Disposition: A | Discharge status: Home / Self Care | Payer: Medicare HMO | Source: Ambulatory Visit | Attending: Cardiology | Admitting: Cardiology

## 2023-04-18 DIAGNOSIS — F411 Generalized anxiety disorder: Secondary | ICD-10-CM | POA: Diagnosis not present

## 2023-04-18 DIAGNOSIS — R109 Unspecified abdominal pain: Secondary | ICD-10-CM

## 2023-04-18 DIAGNOSIS — R102 Pelvic and perineal pain unspecified side: Secondary | ICD-10-CM

## 2023-04-18 DIAGNOSIS — K581 Irritable bowel syndrome with constipation: Secondary | ICD-10-CM

## 2023-04-18 DIAGNOSIS — G8929 Other chronic pain: Secondary | ICD-10-CM

## 2023-04-18 NOTE — Telephone Encounter (Signed)
Pt states she would like to speak with the nurse who did her test this morning at 9am. Please advise

## 2023-04-18 NOTE — Telephone Encounter (Signed)
Donna Francis stated "I just did her test, and she told me all about her history. I explained to her that Dr. Chales Abrahams is trying his best to help her, and ordered this very specific test for her (Mesenteric artery Duplex with inspiration/expiration testing). I explained to her that the reason we do this test, is because sometimes if there is a lack of blood flow to our digestive system, it causes pain. I equated it to a runner pushing themself too far, and their leg muscles are screaming for oxygen. "

## 2023-04-25 NOTE — Progress Notes (Signed)
Please inform the patient. 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 Plan: -Need appt with vascular surgery ASAP please Send report to family physician

## 2023-04-26 ENCOUNTER — Other Ambulatory Visit: Payer: Self-pay

## 2023-04-26 ENCOUNTER — Telehealth: Payer: Self-pay | Admitting: Gastroenterology

## 2023-04-26 DIAGNOSIS — I999 Unspecified disorder of circulatory system: Secondary | ICD-10-CM

## 2023-04-26 DIAGNOSIS — F411 Generalized anxiety disorder: Secondary | ICD-10-CM | POA: Diagnosis not present

## 2023-04-26 NOTE — Progress Notes (Signed)
Sent patient a mychart message.

## 2023-04-26 NOTE — Telephone Encounter (Signed)
Patient called stated she just had a vascular exam and would like to get the results.

## 2023-04-28 NOTE — Telephone Encounter (Addendum)
Patient said she recently had an ultrasound at [facility] that was normal. But spend over 50 minutes with patient with her results. She said that she will not be going to a [facility] vascular surgeon due to what happened to a friend she knows  Dr  Chales Abrahams patient would like your opinion about the recent test results on the mesenteric results and what are her options and what she should do. She said she would like phone call from you please   I told her to call back on the 12th to let me know who she finds out regarding a Armed forces logistics/support/administrative officer and I will see what I can come up with as well in the [place] area she requested that area   Did find Vascular Surgery Clinic [facility]: [phone] Fax [phone] do [facility] Spoke to [place] and he said that it will go for review and then be selected by one of the doctors to see but there isn't a recommendation of a doctor since cases are selected by the doctor

## 2023-05-02 NOTE — Telephone Encounter (Signed)
Lets wait for [facility] vascular surgery clinic appointment-records sent RG

## 2023-05-03 NOTE — Telephone Encounter (Signed)
Patient called stated she would like to discuss the referral to vascular with you.

## 2023-05-04 DIAGNOSIS — F411 Generalized anxiety disorder: Secondary | ICD-10-CM | POA: Diagnosis not present

## 2023-05-04 NOTE — Telephone Encounter (Signed)
[facility] is calling about the vascular referral. The PT wants to have the referral sent to [facility] after finding out it was sent to [facility] vein and vascular. She has already reached out to [facility] and very upset we didn't follow her request. Please advise.

## 2023-05-06 NOTE — Telephone Encounter (Signed)
Patient asked about the collagen is ok for her to take since and she has been doing the premier protein shakes and not the powder now to help with her muscle mass. The protein powder does help her to move her bowels but she wants to know that is ok or if it should be every other day. I did tell her that back in November when she said that she was going to do the protein powder that Dr Chales Abrahams said that it was ok as long as her stomach doesn't hurt. She said she is doing the protein milk and collagen for her muscle mass Please advise  And if she should be on a baby aspirin due to her arteries? She was told by the PA in the hospital at [facility] when she went in April and her arteries was at 50%. Please advise         Confirmation made at [facility] by Loistine Simas that they have her referral all 17 pages and that it is review and they should be calling once the case is reviewed. Last ov, US doppler, CT, demographics and insurance sent.

## 2023-05-06 NOTE — Telephone Encounter (Signed)
Spoke with patient and reminded her of what we did talk about on 04-28-2023 and so told her I will send it to [facility] because she is ok with the place I picked and I told her that I will give her a conformation once its done

## 2023-05-09 NOTE — Telephone Encounter (Signed)
I have discussed her in detail over the phone previously. Continue protein shakes. Hold off on collagen Lets await for appointment at vascular clinic @ [facility]

## 2023-05-10 DIAGNOSIS — F411 Generalized anxiety disorder: Secondary | ICD-10-CM | POA: Diagnosis not present

## 2023-05-11 MED ORDER — PANTOPRAZOLE SODIUM 20 MG PO TBEC: 20.0000 mg | DELAYED_RELEASE_TABLET | Freq: Every day | ORAL | 0 refills | Status: AC

## 2023-05-11 MED ORDER — DICYCLOMINE HCL 10 MG PO CAPS: 10.0000 mg | ORAL_CAPSULE | Freq: Two times a day (BID) | ORAL | 2 refills | Status: DC | PRN

## 2023-05-11 NOTE — Telephone Encounter (Signed)
Patient wants to see if you are willing to prescribe Protonix for her the low dose one because her stomach is cramping but she said she has been having burning that is on and off as well and she has been taking her GI cocktail and her Pepcid but she thinks she needs something that will take it away and she said she can only do Protonix for a few days she can't take it long term.   And about the baby aspirin I told her that is a good questions she can ask the vascular surgeon when she is able to get the appointment about that. But please advise.

## 2023-05-11 NOTE — Telephone Encounter (Signed)
Hold off on protein shakes-may cause more reflux.  Once better, in few days, try resuming Protonix 20 mg p.o. daily #30 (she can use it for few days) Can continue baby aspirin with meals RG

## 2023-05-12 ENCOUNTER — Telehealth: Payer: Self-pay | Admitting: Gastroenterology

## 2023-05-12 NOTE — Telephone Encounter (Signed)
Celiac artery stenosis is the word but she said it could be that or what was found

## 2023-05-12 NOTE — Telephone Encounter (Signed)
Patient said for the [facility] referral she said that they will have to do their own CT and will do their own doppler and she will be seen in February mostly the 2-17 and 2-28 and the appointment when she has the doppler and she was told she will see the doctor that day    She will hold off on the baby aspirin until she see them at vascular. But she has a ride for this she says and that   She knows her medications are at the pharmacy

## 2023-05-12 NOTE — Telephone Encounter (Signed)
Inbound call from patient requesting a urgent call back. Please advise, thank you.

## 2023-05-12 NOTE — Telephone Encounter (Signed)
Patient called and stated that they name for her other possible diagnosis is silica artery stenosis. Please advise.

## 2023-05-26 DIAGNOSIS — F411 Generalized anxiety disorder: Secondary | ICD-10-CM | POA: Diagnosis not present

## 2023-06-01 DIAGNOSIS — F411 Generalized anxiety disorder: Secondary | ICD-10-CM | POA: Diagnosis not present

## 2023-06-02 DIAGNOSIS — R109 Unspecified abdominal pain: Secondary | ICD-10-CM | POA: Diagnosis not present

## 2023-06-02 DIAGNOSIS — F4310 Post-traumatic stress disorder, unspecified: Secondary | ICD-10-CM | POA: Diagnosis not present

## 2023-06-02 DIAGNOSIS — R636 Underweight: Secondary | ICD-10-CM | POA: Diagnosis not present

## 2023-06-02 DIAGNOSIS — E46 Unspecified protein-calorie malnutrition: Secondary | ICD-10-CM | POA: Diagnosis not present

## 2023-06-02 DIAGNOSIS — I774 Celiac artery compression syndrome: Secondary | ICD-10-CM | POA: Diagnosis not present

## 2023-06-02 DIAGNOSIS — R945 Abnormal results of liver function studies: Secondary | ICD-10-CM | POA: Diagnosis not present

## 2023-06-02 DIAGNOSIS — Z681 Body mass index (BMI) 19 or less, adult: Secondary | ICD-10-CM | POA: Diagnosis not present

## 2023-06-02 DIAGNOSIS — F419 Anxiety disorder, unspecified: Secondary | ICD-10-CM | POA: Diagnosis not present

## 2023-06-08 DIAGNOSIS — F411 Generalized anxiety disorder: Secondary | ICD-10-CM | POA: Diagnosis not present

## 2023-06-13 DIAGNOSIS — H9202 Otalgia, left ear: Secondary | ICD-10-CM | POA: Diagnosis not present

## 2023-06-15 DIAGNOSIS — F411 Generalized anxiety disorder: Secondary | ICD-10-CM | POA: Diagnosis not present

## 2023-06-16 DIAGNOSIS — Z1231 Encounter for screening mammogram for malignant neoplasm of breast: Secondary | ICD-10-CM | POA: Diagnosis not present

## 2023-06-17 DIAGNOSIS — Z1231 Encounter for screening mammogram for malignant neoplasm of breast: Secondary | ICD-10-CM | POA: Diagnosis not present

## 2023-06-27 DIAGNOSIS — J4520 Mild intermittent asthma, uncomplicated: Secondary | ICD-10-CM | POA: Diagnosis not present

## 2023-07-11 NOTE — Telephone Encounter (Signed)
Patient said her insurance got changed and she wasn't able to go because she ended up having no insurance at this time. She has to go with [employer]. It was suppose to start February 1st but she hasn't received her card yet and hopefully she is and had to file a grievance as well because of the mishap of what happened with her insurance. She said that she is suppose to call [facility] once she gets her insurance card and see but her appointments are cancelled for now. Said that she did have a mental breakdown as well but she is better as well.  Says she takes Pepcid complete from 71 pounds to 74 pounds and she has been eating good. No real pain her stomach.Said she isn't having any real problems. A little ache or pain for the most part.

## 2023-08-23 ENCOUNTER — Telehealth: Payer: Self-pay | Admitting: Gastroenterology

## 2023-08-23 NOTE — Telephone Encounter (Signed)
 Add MiraLAX 17 g p.o. twice daily until a large bowel movement, then once a day. Agree with [facility] appointment RG

## 2023-08-23 NOTE — Telephone Encounter (Signed)
 Patient said that she has been under stress with neighbors and that she has been having nausea sharp pain and cramping and it hurts to eat but she is gaining weight. Her bowel habits she said she feels like she has to poop but don't and she is smelling her poop even though she isn't having a bowel movement. She said she looked it up and it is coming from her stress. And her lower back hurts too but she keeps eating and not losing weight. She is up to 75 pounds she says  Patient hasn't gone to [facility] to see about her Celiac artery stenosis as well   Patient would lie some advice. I told her that she should just continue what she is doing but if it gets worse than she should go to the ED and also she should consider the [facility] referral since she had to cancel the first time. But I told her that I'll send a message to the doctor. Appointment made for April 11th as well. Please advise

## 2023-08-23 NOTE — Telephone Encounter (Signed)
 Inbound call from patient requesting to speak specifically with you. Patient did not disclose any other additional information.   Please advise

## 2023-08-25 NOTE — Telephone Encounter (Signed)
 Patient will come in for her appointment to talk about the referral and that she will not do miralax since her stool is soft when it comes out

## 2023-08-29 ENCOUNTER — Telehealth: Payer: Self-pay | Admitting: Gastroenterology

## 2023-08-29 NOTE — Telephone Encounter (Signed)
 Patient called and stated that she was suppose to be getting a call back from [place] regarding additional information Dr. Chales Abrahams needed to inform her about. Patient stated that she is now not sure she should keep her appointment due to the weather. Patient is requesting a call back. Please advise.

## 2023-08-30 NOTE — Telephone Encounter (Signed)
 Patient will try to make the appointment. He symptoms are the same. She said she feels the need to go the bathroom and she will go in the morning but then it would feel like something is sitting there through out the rest of the day. She is requesting miralax script to be sent. Is that ok with you?

## 2023-08-31 MED ORDER — POLYETHYLENE GLYCOL 3350 17 GM/SCOOP PO POWD: 17.0000 g | Freq: Every day | ORAL | 2 refills | Status: DC

## 2023-08-31 NOTE — Telephone Encounter (Signed)
 Please do. She can take MiraLAX 17 g p.o. once a day RG

## 2023-08-31 NOTE — Telephone Encounter (Signed)
 Done

## 2023-09-01 DIAGNOSIS — Z681 Body mass index (BMI) 19 or less, adult: Secondary | ICD-10-CM | POA: Diagnosis not present

## 2023-09-01 DIAGNOSIS — R636 Underweight: Secondary | ICD-10-CM | POA: Diagnosis not present

## 2023-09-01 DIAGNOSIS — I479 Paroxysmal tachycardia, unspecified: Secondary | ICD-10-CM | POA: Diagnosis not present

## 2023-09-01 DIAGNOSIS — F419 Anxiety disorder, unspecified: Secondary | ICD-10-CM | POA: Diagnosis not present

## 2023-09-01 NOTE — Telephone Encounter (Signed)
 Says she has been having a fast heart beat lately and done [facility] [facility] and PCP said she needs to see a cardiologist in [place] and have an appointment for May 4th and if they get a referral from her PCP she will get in sooner so her PCP will be sending one to the office

## 2023-09-01 NOTE — Telephone Encounter (Signed)
 Inbound call from patient, calling requesting to cancel her appointment for 4/11. Patient states it is going to rain and she gets anxiety. Patient would like to speak to [facility] in regards to cancellation.

## 2023-09-02 ENCOUNTER — Ambulatory Visit: Admitting: Gastroenterology

## 2023-09-07 DIAGNOSIS — F411 Generalized anxiety disorder: Secondary | ICD-10-CM | POA: Diagnosis not present

## 2023-09-08 ENCOUNTER — Other Ambulatory Visit: Payer: Self-pay | Admitting: Gastroenterology

## 2023-09-08 DIAGNOSIS — G8929 Other chronic pain: Secondary | ICD-10-CM

## 2023-09-08 DIAGNOSIS — K581 Irritable bowel syndrome with constipation: Secondary | ICD-10-CM

## 2023-09-08 DIAGNOSIS — R109 Unspecified abdominal pain: Secondary | ICD-10-CM

## 2023-11-13 LAB — LAB REPORT - SCANNED
EGFR: 99
POC INR: 1

## 2023-11-15 ENCOUNTER — Telehealth: Payer: Self-pay | Admitting: Gastroenterology

## 2023-11-15 NOTE — Telephone Encounter (Signed)
 Patient called stating she was in a lot of pain asking if she could be seen sooner as she was in a lot of pain.  It was explained to patient that Dr. Charlanne does not have any sooner appointments than her already scheduled July 1 appt.  Tomorrow, 6/25, was offered with a PA, but patient insisted she only wanted to see Dr. Charlanne.  Informed patient a nurse or someone would call her back.  She stated she would only speak with [place] or [place].  Please advise.  Thank you.

## 2023-11-16 ENCOUNTER — Telehealth: Payer: Self-pay | Admitting: Gastroenterology

## 2023-11-16 NOTE — Telephone Encounter (Signed)
 We didn't prescribe the medication that was the hospital. Left message with the office and they said they will have Donna Francis call back

## 2023-11-16 NOTE — Telephone Encounter (Signed)
 CT chest/AP with contrast 09/13/2023 at [facility] Mild left hydroureteronephrosis with question 1-2 mm stone within the distal left ureter although evaluation is somewhat limited and this stone may reflect a phlebolith. Findings may reflect reflux instead of obstructive stone as the urinary bladder is distended. Scattered thickening of the small bowel loops may reflect enteritis. No bowel obstruction.   More constipated- retained stool in colon (on my read)- No enteritis. Mesenteric circulation good.  Plan: -PCP to kindly ref to urology -Miralax  17g BID until large BM -Push PO fluids -CT reviewed -Records fro [facility] (esp blood test report)  RG

## 2023-11-16 NOTE — Telephone Encounter (Signed)
 Ct report under Pacs under Marcie Beckum  1-2 mm

## 2023-11-16 NOTE — Telephone Encounter (Signed)
 [facility] called regarding this patient asking to speak to [facility] regarding discontinuing patient Tamsulosin. [facility] rep named Norlene would like a call back from [place] at [phone]. Please advise

## 2023-11-16 NOTE — Telephone Encounter (Signed)
 Patient said she does have a urology and will call them and her PCP and that she will not do miralax  since she said it will cause her to have diarrhea but I told her that is your choice for the miralax  and she knows to push fluids

## 2023-11-16 NOTE — Telephone Encounter (Signed)
 Inbound call from patient requesting a call back to discuss previous note. States she is feel worse and is in a lot of pain and feels as though her chest is heavy. Also states she is nauseous and feels weak and unable to have a bowel movement. Requesting a call back as soon as possible from [place]. Please advise, thank you.

## 2023-11-16 NOTE — Telephone Encounter (Signed)
 Patient said she went to [facility] on Sunday by ambulance said that she was complaining about chest pain trouble pooping due to weakness and lower abd pain. Said that they started an IV and gave her aspirin in the ambulance. In the hospital she decline medication like morphine and Zofran  and then they told her she needed the zofran  but she didn't want that since she has that at home and she didnt want phergaran and the nurse told her that zofran  causes heart rate to increase but she ended up taking the zofran  and went to sleep and they did blood work and a CT scan on her and she was told after  the CT scan that she got a 1cm kidney stone and a slight UTI and patient said the doctor will give her amoxicillin and they would get another blood test  for her heart but they came back in and didn't take the additional blood work and discharge her. She said that when she got her discharged paper work she said they gave her Flomax and oxycodone and Zofran . She is advising what to do because she is having extreme nausea weakness tired due to not sleeping as much and stomach seem to be getting worse/ she got a bad taste in her much and mouth is dry. She is doing Pepcid  complete lidocaine  and Zofran  medication. Please advise

## 2023-11-17 ENCOUNTER — Encounter (HOSPITAL_COMMUNITY): Payer: Self-pay | Admitting: Emergency Medicine

## 2023-11-17 ENCOUNTER — Emergency Department (HOSPITAL_COMMUNITY)
Admission: EM | Admit: 2023-11-17 | Discharge: 2023-11-17 | Disposition: A | Discharge status: Home / Self Care | Attending: Emergency Medicine | Admitting: Emergency Medicine

## 2023-11-17 ENCOUNTER — Telehealth: Payer: Self-pay | Admitting: Gastroenterology

## 2023-11-17 ENCOUNTER — Emergency Department (HOSPITAL_COMMUNITY)

## 2023-11-17 ENCOUNTER — Other Ambulatory Visit: Payer: Self-pay

## 2023-11-17 DIAGNOSIS — R110 Nausea: Secondary | ICD-10-CM | POA: Insufficient documentation

## 2023-11-17 DIAGNOSIS — R0789 Other chest pain: Secondary | ICD-10-CM | POA: Diagnosis not present

## 2023-11-17 DIAGNOSIS — R1084 Generalized abdominal pain: Secondary | ICD-10-CM | POA: Diagnosis not present

## 2023-11-17 DIAGNOSIS — R109 Unspecified abdominal pain: Secondary | ICD-10-CM | POA: Diagnosis present

## 2023-11-17 LAB — COMPREHENSIVE METABOLIC PANEL WITH GFR
ALT: 19 U/L (ref 0–44)
AST: 24 U/L (ref 15–41)
Albumin: 4.2 g/dL (ref 3.5–5.0)
Alkaline Phosphatase: 69 U/L (ref 38–126)
Anion gap: 12 (ref 5–15)
BUN: 7 mg/dL (ref 6–20)
CO2: 26 mmol/L (ref 22–32)
Calcium: 9.5 mg/dL (ref 8.9–10.3)
Chloride: 94 mmol/L — ABNORMAL LOW (ref 98–111)
Creatinine, Ser: 0.74 mg/dL (ref 0.44–1.00)
GFR, Estimated: >60 mL/min (ref >60)
Glucose, Bld: 93 mg/dL (ref 70–99)
Potassium: 4 mmol/L (ref 3.5–5.1)
Sodium: 132 mmol/L — ABNORMAL LOW (ref 135–145)
Total Bilirubin: 0.7 mg/dL (ref 0.0–1.2)
Total Protein: 8 g/dL (ref 6.5–8.1)

## 2023-11-17 LAB — URINALYSIS, ROUTINE W REFLEX MICROSCOPIC
Bilirubin Urine: NEGATIVE
Glucose, UA: NEGATIVE mg/dL
Hgb urine dipstick: NEGATIVE
Ketones, ur: 20 mg/dL — AB
Nitrite: NEGATIVE
Protein, ur: NEGATIVE mg/dL
Specific Gravity, Urine: 1.008 (ref 1.005–1.030)
pH: 6 (ref 5.0–8.0)

## 2023-11-17 LAB — CBC
HCT: 40.4 % (ref 36.0–46.0)
Hemoglobin: 13.2 g/dL (ref 12.0–15.0)
MCH: 30.9 pg (ref 26.0–34.0)
MCHC: 32.7 g/dL (ref 30.0–36.0)
MCV: 94.6 fL (ref 80.0–100.0)
Platelets: 271 10*3/uL (ref 150–400)
RBC: 4.27 MIL/uL (ref 3.87–5.11)
RDW: 11.5 % (ref 11.5–15.5)
WBC: 5.5 10*3/uL (ref 4.0–10.5)
nRBC: 0 % (ref 0.0–0.2)

## 2023-11-17 LAB — TROPONIN I (HIGH SENSITIVITY): Troponin I (High Sensitivity): 3 ng/L (ref <18)

## 2023-11-17 LAB — LIPASE, BLOOD: Lipase: 29 U/L (ref 11–51)

## 2023-11-17 MED ORDER — IOHEXOL 300 MG/ML  SOLN
80.0000 mL | Freq: Once | INTRAMUSCULAR | Status: AC | PRN
Start: 2023-11-17 — End: 2023-11-17
  Administered 2023-11-17: 80 mL via INTRAVENOUS

## 2023-11-17 MED ORDER — ONDANSETRON HCL 4 MG/2ML IJ SOLN
4.0000 mg | Freq: Once | INTRAMUSCULAR | Status: AC
  Administered 2023-11-17: 4 mg via INTRAVENOUS
  Filled 2023-11-17: qty 2

## 2023-11-17 MED ORDER — LACTATED RINGERS IV BOLUS
1000.0000 mL | Freq: Once | INTRAVENOUS | Status: AC
  Administered 2023-11-17: 1000 mL via INTRAVENOUS

## 2023-11-17 MED ORDER — ONDANSETRON 4 MG PO TBDP: 4.0000 mg | ORAL_TABLET | Freq: Three times a day (TID) | ORAL | 0 refills | Status: AC | PRN

## 2023-11-17 MED ORDER — ALUM & MAG HYDROXIDE-SIMETH 200-200-20 MG/5ML PO SUSP
30.0000 mL | Freq: Once | ORAL | Status: AC
  Administered 2023-11-17: 30 mL via ORAL
  Filled 2023-11-17: qty 30

## 2023-11-17 MED ORDER — FLUCONAZOLE 150 MG PO TABS
150.0000 mg | ORAL_TABLET | Freq: Once | ORAL | Status: AC
  Administered 2023-11-17: 150 mg via ORAL
  Filled 2023-11-17: qty 1

## 2023-11-17 MED ORDER — MORPHINE SULFATE (PF) 2 MG/ML IV SOLN: 2.0000 mg | Freq: Once | INTRAVENOUS | Status: DC

## 2023-11-17 NOTE — Telephone Encounter (Signed)
 Patient called and stated that she is at the hospital right now due to her being very weak. Patient is requesting to speak to either Dr. Ira nurse or [facility]. Please advise.

## 2023-11-17 NOTE — ED Provider Notes (Signed)
 [place] EMERGENCY DEPARTMENT AT [facility] Provider Note   CSN: [id number] Arrival date & time: 11/17/23  1120     Patient presents with: Abdominal Pain   Donna Francis is a 63 y.o. female.   Pt c/o abd pain and nausea in past few days. Dull pain, general, not localized, non radiating, without specific exacerbating or alleviating fa ctors. Hx IBS, unsure if similar. Indicates some constipation in past few days although did have small bm yesterday - indicates has chronic weakness of pelvic floor. No abd distension. No vomiting. No fever or chills. No dysuria or hematuria. Indicates also at times awakens from sleep with chest discomfort,?heartburn. Denies exertional chest pain. No sob. No vomiting or diaphoresis. No constant and/or pleuritic chest pain. No leg pain or swelling.   The history is provided by the patient, medical records and the EMS personnel.  Abdominal Pain Associated symptoms: nausea   Associated symptoms: no chills, no cough, no dysuria, no fever, no shortness of breath, no sore throat and no vomiting        Prior to Admission medications   Medication Sig Start Date End Date Taking? Authorizing Provider  acetaminophen  (TYLENOL ) 500 MG tablet Take 500 mg by mouth 3 (three) times daily. Patient not taking: Reported on 11/29/2022    [provider]  ALPRAZolam  (XANAX ) 1 MG tablet Take 1 mg by mouth 4 (four) times daily.    [provider]  Alum & Mag Hydroxide-Simeth (MYLANTA PO) Take 1 tablet by mouth as needed.    [provider]  dicyclomine  (BENTYL ) 10 MG capsule Take 1 capsule (10 mg total) by mouth 3 (three) times daily as needed for spasms. 06/18/22   Charlanne Groom, MD  dicyclomine  (BENTYL ) 10 MG capsule Take 1 capsule (10 mg total) by mouth 2 (two) times daily as needed for spasms. 05/11/23   Charlanne Groom, MD  dicyclomine  (BENTYL ) 20 MG tablet Take 1 tablet (20 mg total) by mouth 2 (two) times daily as needed for  spasms. Please break into 1/4 04/01/23   Charlanne Groom, MD  lidocaine  (XYLOCAINE ) 2 % solution Use as directed 15 mLs in the mouth or throat as needed for mouth pain.    [provider]  loratadine (CLARITIN) 5 MG chewable tablet Chew 10 mg by mouth daily.    [provider]  montelukast (SINGULAIR) 5 MG chewable tablet Chew 5 mg by mouth daily. 11/13/20   [provider]  ondansetron  (ZOFRAN -ODT) 4 MG disintegrating tablet DISSOLVE 1 TABLET IN MOUTH EVERY 6 HOURS AS NEEDED FOR NAUSEA FOR VOMITING 08/07/21   Charlanne Groom, MD  pantoprazole  (PROTONIX ) 20 MG tablet Take 1 tablet (20 mg total) by mouth daily. 05/11/23   Charlanne Groom, MD  polyethylene glycol powder (GLYCOLAX /MIRALAX ) 17 GM/SCOOP powder Take 17 g by mouth daily. 08/31/23   Charlanne Groom, MD    Allergies: Fentanyl, Haldol  [haloperidol ], Kenalog [triamcinolone ], Propofol, and Proton pump inhibitors    Review of Systems  Constitutional:  Negative for chills and fever.  HENT:  Negative for sore throat.   Eyes:  Negative for redness.  Respiratory:  Negative for cough and shortness of breath.   Cardiovascular:  Negative for leg swelling.  Gastrointestinal:  Positive for abdominal pain and nausea. Negative for vomiting.  Genitourinary:  Negative for dysuria and flank pain.  Musculoskeletal:  Negative for back pain and neck pain.  Neurological:  Negative for headaches.    Updated Vital Signs BP (!) 145/85 (BP Location: Left  Arm)   Pulse (!) 109   Temp 99.3 F (37.4 C) (Oral)   Resp (!) 21   SpO2 100%   Physical Exam Vitals and nursing note reviewed.  Constitutional:      Appearance: Normal appearance. She is well-developed.  HENT:     Head: Atraumatic.     Nose: Nose normal.     Mouth/Throat:     Mouth: Mucous membranes are moist.   Eyes:     General: No scleral icterus.    Conjunctiva/sclera: Conjunctivae normal.   Neck:     Trachea: No tracheal deviation.   Cardiovascular:     Rate and  Rhythm: Normal rate and regular rhythm.     Pulses: Normal pulses.     Heart sounds: Normal heart sounds. No murmur heard.    No friction rub. No gallop.  Pulmonary:     Effort: Pulmonary effort is normal. No respiratory distress.     Breath sounds: Normal breath sounds.  Abdominal:     General: Bowel sounds are normal. There is no distension.     Palpations: Abdomen is soft. There is no mass.     Tenderness: There is abdominal tenderness. There is no guarding or rebound.     Hernia: No hernia is present.     Comments: Epgiastric/mid abd tenderness.   Genitourinary:    Comments: No cva tenderness.   Musculoskeletal:        General: No swelling or tenderness.     Cervical back: Normal range of motion and neck supple. No rigidity. No muscular tenderness.   Skin:    General: Skin is warm and dry.     Findings: No rash.   Neurological:     Mental Status: She is alert.     Comments: Alert, speech normal.   Psychiatric:        Mood and Affect: Mood normal.     (all labs ordered are listed, but only abnormal results are displayed) Results for orders placed or performed during the hospital encounter of 11/17/23  Lipase, blood   Collection Time: 11/17/23 12:03 PM  Result Value Ref Range   Lipase 29 11 - 51 U/L  Comprehensive metabolic panel   Collection Time: 11/17/23 12:03 PM  Result Value Ref Range   Sodium 132 (L) 135 - 145 mmol/L   Potassium 4.0 3.5 - 5.1 mmol/L   Chloride 94 (L) 98 - 111 mmol/L   CO2 26 22 - 32 mmol/L   Glucose, Bld 93 70 - 99 mg/dL   BUN 7 6 - 20 mg/dL   Creatinine, Ser 9.25 0.44 - 1.00 mg/dL   Calcium 9.5 8.9 - 89.6 mg/dL   Total Protein 8.0 6.5 - 8.1 g/dL   Albumin 4.2 3.5 - 5.0 g/dL   AST 24 15 - 41 U/L   ALT 19 0 - 44 U/L   Alkaline Phosphatase 69 38 - 126 U/L   Total Bilirubin 0.7 0.0 - 1.2 mg/dL   GFR, Estimated >39 >39 mL/min   Anion gap 12 5 - 15  CBC   Collection Time: 11/17/23 12:03 PM  Result Value Ref Range   WBC 5.5 4.0 - 10.5  K/uL   RBC 4.27 3.87 - 5.11 MIL/uL   Hemoglobin 13.2 12.0 - 15.0 g/dL   HCT 59.5 63.9 - 53.9 %   MCV 94.6 80.0 - 100.0 fL   MCH 30.9 26.0 - 34.0 pg   MCHC 32.7 30.0 - 36.0 g/dL   RDW 88.4 88.4 -  15.5 %   Platelets 271 150 - 400 K/uL   nRBC 0.0 0.0 - 0.2 %  Troponin I (High Sensitivity)   Collection Time: 11/17/23  2:16 PM  Result Value Ref Range   Troponin I (High Sensitivity) 3 <18 ng/L     EKG: EKG Interpretation Date/Time:  Thursday November 17 2023 12:06:49 EDT Ventricular Rate:  126 PR Interval:  121 QRS Duration:  90 QT Interval:  314 QTC Calculation: 455 R Axis:   118  Text Interpretation: Sinus tachycardia Confirmed by Bernard Drivers ([id number]) on 11/17/2023 2:01:00 PM  Radiology: No results found.   Procedures   Medications Ordered in the ED  ondansetron  (ZOFRAN ) injection 4 mg (has no administration in time range)  lactated ringers  bolus 1,000 mL (1,000 mLs Intravenous New Bag/Given 11/17/23 1419)                                    Medical Decision Making Problems Addressed: Atypical chest pain: acute illness or injury with systemic symptoms that poses a threat to life or bodily functions Generalized abdominal pain: acute illness or injury with systemic symptoms that poses a threat to life or bodily functions Nausea: acute illness or injury with systemic symptoms that poses a threat to life or bodily functions  Amount and/or Complexity of Data Reviewed External Data Reviewed: notes. Labs: ordered. Decision-making details documented in ED Course. Radiology: ordered and independent interpretation performed. Decision-making details documented in ED Course. ECG/medicine tests: ordered and independent interpretation performed. Decision-making details documented in ED Course.  Risk Prescription drug management. Decision regarding hospitalization.   Iv ns. Continuous pulse ox and cardiac monitoring. Labs ordered/sent. Imaging ordered.   Differential diagnosis  includes abd cramping, IBS, acute abd process, acs, gi/msk cp, etc. Dispo decision including potential need for admission considered - will get labs and imaging and reassess.   Reviewed nursing notes and prior charts for additional history. External reports reviewed.   Cardiac monitor: sinus rhythm, rate 90.  LR bolus. Zofran  iv. Morphine iv.   Labs reviewed/interpreted by me - wbc and hgb normal. Chem unremarkable, lipase normal.   CT reviewed/interpreted by me - pnd.   1515, ct pending - signed out to oncoming EDP to check ct, recheck pt and dispo appropriately - if labs and ct unremarkable, feel likely will be stable for d/c.       Final diagnoses:  None    ED Discharge Orders     None          Bernard Drivers, MD 11/17/23 [phone]

## 2023-11-17 NOTE — ED Provider Notes (Signed)
 Pt signed out by Dr. Bernard pending CT report.  CT reviewed by me.  I agree with the radiologist.  No acute inflammatory process identified within the chest,  abdomen or pelvis.  2. No lung mass, consolidation, pleural effusion or pneumothorax. No  bowel obstruction.  3. Multiple other nonacute observations, as described above.     Pt is feeling better after fluids.  Cp is likely due to GERD.  She does have some yeast in her urine, so she's given diflucan in ED.  Pt encouraged to continue taking her PPI.  No kidney stone on CT, so no need to take the flomax rx'd at [facility].    Pt has an appt on Tuesday (7/1) with Dr. Charlanne.  She was supposed to go to [facility] to see a specialist regarding possible celiac artery syndrome.  She never went due to transportation issues.  She said she will talk with Dr. Charlanne on Tuesday to see if she still needs to see them.   Pt is stable for d/c. Return if worse.    Dean Clarity, MD 11/17/23 [phone]

## 2023-11-17 NOTE — Discharge Instructions (Signed)
 It was our pleasure to provide your ER care today - we hope that you feel better.  Drink plenty of fluids/stay well hydrated. Take acetaminophen  as need. If reflux symptoms, try pepcid  or maalox as need.   Follow up closely with your doctor/gi doctor in the coming week.  Return to ER if worse, new symptoms, fevers, new or worsening or severe abdominal pain, persistent vomiting, recurrent or persistent chest pain, increased trouble breathing, or other concern.

## 2023-11-17 NOTE — ED Triage Notes (Signed)
 Pt reports abdominal pain, nausea, and weakness. Pt was advised to come to ER by PCP.

## 2023-11-17 NOTE — Telephone Encounter (Signed)
 Records placed on Dr box for review

## 2023-11-17 NOTE — Telephone Encounter (Signed)
 Patient at [facility]

## 2023-11-17 NOTE — Telephone Encounter (Signed)
 Returned call to patient & she is currently at [facility] ED for ongoing nausea, chest pressure, and constipation. Labs & UA pending, she was about to have another CT. Advised patient to address all her concerns with ED provider while she is there & to keep follow up with Dr. Charlanne next week.

## 2023-11-18 NOTE — Telephone Encounter (Signed)
 Patient is back home and she will be here for her appointment on tuesday

## 2023-11-22 ENCOUNTER — Ambulatory Visit (INDEPENDENT_AMBULATORY_CARE_PROVIDER_SITE_OTHER): Admitting: Gastroenterology

## 2023-11-22 ENCOUNTER — Encounter: Payer: Self-pay | Admitting: Gastroenterology

## 2023-11-22 VITALS — BP 130/62 | HR 143 | Ht <= 58 in | Wt 74.0 lb

## 2023-11-22 DIAGNOSIS — I999 Unspecified disorder of circulatory system: Secondary | ICD-10-CM

## 2023-11-22 DIAGNOSIS — R109 Unspecified abdominal pain: Secondary | ICD-10-CM | POA: Diagnosis not present

## 2023-11-22 DIAGNOSIS — K581 Irritable bowel syndrome with constipation: Secondary | ICD-10-CM

## 2023-11-22 DIAGNOSIS — R102 Pelvic and perineal pain: Secondary | ICD-10-CM

## 2023-11-22 DIAGNOSIS — G8929 Other chronic pain: Secondary | ICD-10-CM

## 2023-11-22 DIAGNOSIS — K219 Gastro-esophageal reflux disease without esophagitis: Secondary | ICD-10-CM | POA: Diagnosis not present

## 2023-11-22 NOTE — Patient Instructions (Addendum)
 _______________________________________________________  If your blood pressure at your visit was 140/90 or greater, please contact your primary care physician to follow up on this.  _______________________________________________________  If you are age 61 or older, your body mass index should be between 23-30. Your Body mass index is 16.59 kg/m. If this is out of the aforementioned range listed, please consider follow up with your Primary Care Provider.  If you are age 52 or younger, your body mass index should be between 19-25. Your Body mass index is 16.59 kg/m. If this is out of the aformentioned range listed, please consider follow up with your Primary Care Provider.   ________________________________________________________  The [facility] providers would like to encourage you to use MYCHART to communicate with providers for non-urgent requests or questions.  Due to long hold times on the telephone, sending your provider a message by [facility] may be a faster and more efficient way to get a response.  Please allow 48 business hours for a response.  Please remember that this is for non-urgent requests.  _______________________________________________________   Continue current medications   Small but frequent meals   Thank you,  Dr. Lynnie Bring

## 2023-11-22 NOTE — Progress Notes (Signed)
 Chief Complaint: FU   Referring Provider:  Dr Keren        ASSESSMENT AND PLAN;    #1. IBS-C. H/O fecal impaction s/p manual disimpaction 05/2020.  Subsequent rectal exams neg.  Stool soft heme-neg. Neg stool studies, virtual colon 11/2017. Failed bentyl , amitriptyline. Refuses colon. Neg cologuard 08/2021  #2. Chronic abdo/pelvic pain (since hysterectomy 04/2016). Dx with IC by urology, fibromyalgia/anxiety/depression/panic attacks/conversion disorder/pseudoseizures. Multiple ED visits. Multiple neg CT scans A/P- 10/2023, 10/2021, 07/2021, 03/28/2019, 03/25/2019, 02/14/2019, 12/2016, 07/2017, 12/2018.   #3. 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 Better. Had appt @ [facility]. But wants to hold off.    #4. GERD with neg Ba Swallow 04/2018.  Neg EGD 04/24/2019, 02/2020. Failed carafate , GI coctail, protonix , omeprazole, nexium (caused burning) (can't do PPIs).   #5. SIGNIFICANT ANXIETY/DEPRESSION/PANIC DISORDER/CONVERSION DISORDER/PTSD   Plan:   -Small but frequent meals. -Continue pepcid  PRN -Increase water intake. -No GI intervention planned at this time. -If any further UGI problems, would consider solid-phase gastric emptying scan.   HPI:     Donna Francis is a 63 y.o. female  With fibromyalgia, anxiety/depression, costochondritis  Discussed the use of AI scribe software for clinical note transcription with the patient, who gave verbal consent to proceed.  History of Present Illness She experiences significant gastrointestinal symptoms, including frequent bowel movements that are difficult to pass. Her stool is often hard to expel, although it was easier on the day of the visit. No diarrhea, but she has a history of diarrhea when using stool softeners like Colace, which caused watery stools. Miralax  has been more effective in the past. She sometimes resorts to manual evacuation using a gloved finger due to difficulty passing stool.  She experiences sharp  and achy abdominal pain, which sometimes radiates upwards. Trapped gas causes significant discomfort, requiring her to walk around and rub her stomach to alleviate the pain. She notes frequent burping, which she has experienced before.  She has a history of costochondritis and experiences chest pain that wakes her up at night, although it has recently calmed down. The pain is present in the morning but subsides after a few hours. She sleeps in an upright position to manage the pain.  She has experienced weight loss, dropping from 78 pounds to 77 pounds, and now further down, despite maintaining her regular diet. She attributes some of her stress to family issues, including her daughter's legal and personal troubles, which have been emotionally taxing.  She underwent a CT scan on June 26th which was unremarkable.  She drinks plenty of water, approximately four bottles a day. Her sodium levels were noted to be low at 132, but this is not a frequent occurrence.   Wt Readings from Last 3 Encounters:  11/22/23 74 lb (33.6 kg)  11/29/22 72 lb (32.7 kg)  07/14/22 73 lb 14.4 oz (33.5 kg)   Her cardiology work-up has been neg.  She has done well with the behavioral therapy.   Daughter with ETOH pancreatitis Sister has IBS at [place].  Now diagnosed with epigastric hernia.   Prev GI [facility]U  CTAP with contrast 11/17/2023 IMPRESSION: 1. No acute inflammatory process identified within the chest, abdomen or pelvis. 2. No lung mass, consolidation, pleural effusion or pneumothorax. No bowel obstruction. 3. Multiple other nonacute observations, as described above.    CT AP with contrast 08/26/2022 at [facility] -No acute abn -Appears to >50% narrowing Px celiac. SMA, IMA patent. No atherosclerosis   H/O  abn LFTs:  LFTs  07/2022: AST 62, ALT 83 08/24/2022: AST 74, ALT 102 08/30/2022: AST 26, ALT 48. Nl CBC 09/01/2022: AST 46, ALT 56 Neg autoimmune hepatitis panel (AMA, ASMA), ANA, iron studies,serum ceruloplasmin,  A1AT, celiac screen and GGT.  Not immune to A/B- refuses vaccine  -occ dysphagia. S/P EGD by Dr CHRISTELLA Oct 2021 at [facility] while inpt. Has problems waking up. Thereafter, had dysphagia (with solids and liquids) and odynophagia and abdo pain d/t spasms. Convinced that most of problems are related to recent EGD  -Chronic abd pain with associated nausea but no vomiting, feeling dizzy, weakness, occasional shortness of breath, fatigue, headaches, palpitations, back pain, belching, unexplained weight loss, a lot of gas  -Can only eat chicken, sea food, berries, apple sauce. Pediasure didn't work.  Tolerating small meals.   -S/p EGD 04/24/2019 showing mild gastritis.  Biopsies were negative for H. Pylori.  SB Bx- neg for celiac disease.  Intolerance to multiple PPIs.  -She is under care of Landry Morones -psychologist in [place].  She really likes her.  Situational anxiety is getting under control.  -All her symptoms started after she had a hysterectomy 04/2016.  She has been seen by GYN at [facility].  I have reviewed the notes in care everywhere.  It has been recommended to start pelvic physical therapy.   -Refuses colonoscopy. Neg cologuard 08/2021: neg  Wt Readings from Last 3 Encounters:  11/22/23 74 lb (33.6 kg)  11/29/22 72 lb (32.7 kg)  07/14/22 73 lb 14.4 oz (33.5 kg)   CT AP with contrast 10/2021 IMPRESSION: No acute abnormality identified in the abdomen or pelvis.      Past Medical History:  Diagnosis Date   Anxiety     Colitis     IBS (irritable bowel syndrome)     Varicose veins of bilateral lower extremities with pain             Past Surgical History:  Procedure Laterality Date   ABDOMINAL HYSTERECTOMY   04/2016   CESAREAN SECTION        x 4   COLONOSCOPY   1995    in [place]            Family History  Problem Relation Age of Onset   Pancreatic cancer Mother        Social History         Tobacco Use   Smoking status: Never Smoker   Smokeless tobacco: Never Used   Substance Use Topics   Alcohol  use: Yes      Comment: minimal   Drug use: No            Current Outpatient Medications  Medication Sig Dispense Refill   ALPRAZolam  (XANAX ) 0.5 MG tablet Take 1 mg by mouth 4 times        Probiotic Product (PROBIOTIC DAILY PO) Take by mouth daily.        No current facility-administered medications for this visit.       No Known Allergies   Review of Systems:  Psychiatric/Behavioral:  Has anxiety or depression       Physical Exam:    Vitals:   11/22/23 1101  Weight: 74 lb (33.6 kg)  Height: 4' 8 (1.422 m)   Blood pressure 146/80, pulse 100/min  Gen: awake, alert, NAD HEENT: anicteric, no pallor CV: RRR, no mrg Pulm: CTA b/l Abd: soft, NT/ND, +BS throughout Ext: no c/c/e Neuro: nonfocal     Latest Ref Rng &  Units 11/17/2023   12:03 PM 11/29/2022   12:11 PM 10/29/2021    3:01 PM  CBC  WBC 4.0 - 10.5 K/uL 5.5  5.0  4.4   Hemoglobin 12.0 - 15.0 g/dL 86.7  87.7  88.1   Hematocrit 36.0 - 46.0 % 40.4  37.4  36.5   Platelets 150 - 400 K/uL 271  282.0  263       Latest Ref Rng & Units 11/17/2023   12:03 PM 11/29/2022   12:11 PM 10/29/2021    3:01 PM  CMP  Glucose 70 - 99 mg/dL 93  95  77   BUN 6 - 20 mg/dL 7  8  8    Creatinine 0.44 - 1.00 mg/dL 9.25  9.33  9.51   Sodium 135 - 145 mmol/L 132  138  139   Potassium 3.5 - 5.1 mmol/L 4.0  4.1  3.7   Chloride 98 - 111 mmol/L 94  98  107   CO2 22 - 32 mmol/L 26  30  25    Calcium 8.9 - 10.3 mg/dL 9.5  89.9  8.8   Total Protein 6.5 - 8.1 g/dL 8.0  8.1  7.2   Total Bilirubin 0.0 - 1.2 mg/dL 0.7  0.4  0.6   Alkaline Phos 38 - 126 U/L 69  73  57   AST 15 - 41 U/L 24  30  23    ALT 0 - 44 U/L 19  32  25        Anselm Bring, MD   Cc: Dr Keren

## 2023-12-01 ENCOUNTER — Telehealth: Payer: Self-pay | Admitting: Gastroenterology

## 2023-12-01 NOTE — Telephone Encounter (Signed)
 Spoke with patient & she is concerned due to noticing a small amount of bright red bleeding after trying to disimpact herself. Advised her to be cautious when doing this & it's likely the bleeding is from the manual disimpaction or a hemorrhoid that she has flared up, and to monitor frequency/amount for now. Pt refuses miralax  or stool softeners, states her butthole closes up & that it's not a constipation issue. Advised she increase water & fiber intake, and use a squatty potty if able. She has appointment today with PCP to discuss anxiety/stress, and advised she discuss these concerns we talked about today with PCP as well.

## 2023-12-01 NOTE — Telephone Encounter (Signed)
 Inbound call from patient states she has been inserting her finger in her rectum for the past week and this morning after she inserted her finger, she had blood on the glove as well as on the stool as well. Patient requesting f/u call to speak with a nurse. Please advise.

## 2023-12-05 NOTE — Telephone Encounter (Signed)
 Patient said that she is hurting all over her legs are weak and her stomach is hurting and having shooting pain throughout the body and butt hole. Feeling woozy as well. She has no appetite. Was only able to eat something yesterday to Says her poops smells really bad for the last 3 days. Smells like something dead on the side of the road and can taste her poop as well. She has this cough for the last 3 weeks. Chest feels like a sunk in feeling. [place] its her Costochondritis possible. Said she did get her placed bombed for fleas. Her stomach makes a swoosh noise. And said it is sucking in. Said that she was going to bed at 10pm and sleep for 1 to 2 hours and then will wake up and be up until 5am and then go to sleep and wake up until 7am. Says her vitamin D is really low as well. Doing her GI cocktail and Zofran . Told her that if she isn't getting better than she needs to go to the ED. Please advise

## 2023-12-05 NOTE — Telephone Encounter (Signed)
 Please see note below.

## 2023-12-05 NOTE — Telephone Encounter (Signed)
 Inbound call from patient stated she would like to speak to a nurse in regards to previous notes. Patient states she is still experiencing extreme pain and dizziness. States she can barely sleep through the night due to being woken up from the pain.   Please advise  Thank you

## 2023-12-06 ENCOUNTER — Other Ambulatory Visit: Payer: Self-pay

## 2023-12-06 ENCOUNTER — Emergency Department (HOSPITAL_COMMUNITY)
Admission: EM | Admit: 2023-12-06 | Discharge: 2023-12-06 | Disposition: A | Discharge status: Home / Self Care | Attending: Emergency Medicine | Admitting: Emergency Medicine

## 2023-12-06 DIAGNOSIS — E871 Hypo-osmolality and hyponatremia: Secondary | ICD-10-CM | POA: Diagnosis not present

## 2023-12-06 DIAGNOSIS — F419 Anxiety disorder, unspecified: Secondary | ICD-10-CM | POA: Diagnosis not present

## 2023-12-06 DIAGNOSIS — M791 Myalgia, unspecified site: Secondary | ICD-10-CM | POA: Insufficient documentation

## 2023-12-06 DIAGNOSIS — R072 Precordial pain: Secondary | ICD-10-CM | POA: Insufficient documentation

## 2023-12-06 DIAGNOSIS — J45909 Unspecified asthma, uncomplicated: Secondary | ICD-10-CM | POA: Insufficient documentation

## 2023-12-06 LAB — CBC WITH DIFFERENTIAL/PLATELET
Abs Immature Granulocytes: 0.02 K/uL (ref 0.00–0.07)
Basophils Absolute: 0 K/uL (ref 0.0–0.1)
Basophils Relative: 0 %
Eosinophils Absolute: 0 K/uL (ref 0.0–0.5)
Eosinophils Relative: 1 %
HCT: 38.5 % (ref 36.0–46.0)
Hemoglobin: 12.7 g/dL (ref 12.0–15.0)
Immature Granulocytes: 0 %
Lymphocytes Relative: 29 %
Lymphs Abs: 1.4 K/uL (ref 0.7–4.0)
MCH: 30.2 pg (ref 26.0–34.0)
MCHC: 33 g/dL (ref 30.0–36.0)
MCV: 91.4 fL (ref 80.0–100.0)
Monocytes Absolute: 1 K/uL (ref 0.1–1.0)
Monocytes Relative: 21 %
Neutro Abs: 2.2 K/uL (ref 1.7–7.7)
Neutrophils Relative %: 49 %
Platelets: 281 K/uL (ref 150–400)
RBC: 4.21 MIL/uL (ref 3.87–5.11)
RDW: 11.7 % (ref 11.5–15.5)
WBC: 4.6 K/uL (ref 4.0–10.5)
nRBC: 0 % (ref 0.0–0.2)

## 2023-12-06 LAB — COMPREHENSIVE METABOLIC PANEL WITH GFR
ALT: 23 U/L (ref 0–44)
AST: 26 U/L (ref 15–41)
Albumin: 3.8 g/dL (ref 3.5–5.0)
Alkaline Phosphatase: 69 U/L (ref 38–126)
Anion gap: 12 (ref 5–15)
BUN: 5 mg/dL — ABNORMAL LOW (ref 8–23)
CO2: 24 mmol/L (ref 22–32)
Calcium: 9.2 mg/dL (ref 8.9–10.3)
Chloride: 92 mmol/L — ABNORMAL LOW (ref 98–111)
Creatinine, Ser: 0.61 mg/dL (ref 0.44–1.00)
GFR, Estimated: >60 mL/min (ref >60)
Glucose, Bld: 99 mg/dL (ref 70–99)
Potassium: 3.7 mmol/L (ref 3.5–5.1)
Sodium: 128 mmol/L — ABNORMAL LOW (ref 135–145)
Total Bilirubin: 0.7 mg/dL (ref 0.0–1.2)
Total Protein: 7.8 g/dL (ref 6.5–8.1)

## 2023-12-06 LAB — LIPASE, BLOOD: Lipase: 64 U/L — ABNORMAL HIGH (ref 11–51)

## 2023-12-06 LAB — TROPONIN I (HIGH SENSITIVITY): Troponin I (High Sensitivity): 3 ng/L (ref <18)

## 2023-12-06 MED ORDER — SODIUM CHLORIDE 0.9 % IV BOLUS
1000.0000 mL | Freq: Once | INTRAVENOUS | Status: AC
  Administered 2023-12-06: 1000 mL via INTRAVENOUS

## 2023-12-06 MED ORDER — ALUM & MAG HYDROXIDE-SIMETH 200-200-20 MG/5ML PO SUSP
30.0000 mL | Freq: Once | ORAL | Status: AC
  Administered 2023-12-06: 30 mL via ORAL
  Filled 2023-12-06: qty 30

## 2023-12-06 MED ORDER — ONDANSETRON HCL 4 MG/2ML IJ SOLN
4.0000 mg | Freq: Once | INTRAMUSCULAR | Status: AC
  Administered 2023-12-06: 4 mg via INTRAVENOUS
  Filled 2023-12-06: qty 2

## 2023-12-06 MED ORDER — LIDOCAINE VISCOUS HCL 2 % MT SOLN
15.0000 mL | Freq: Once | OROMUCOSAL | Status: AC
  Administered 2023-12-06: 15 mL via ORAL
  Filled 2023-12-06: qty 15

## 2023-12-06 MED ORDER — ALPRAZOLAM 0.5 MG PO TABS
1.0000 mg | ORAL_TABLET | Freq: Once | ORAL | Status: AC
  Administered 2023-12-06: 1 mg via ORAL
  Filled 2023-12-06: qty 2

## 2023-12-06 NOTE — ED Provider Notes (Signed)
  EMERGENCY DEPARTMENT AT [facility] Provider Note  CSN: [id number] Arrival date & time: 12/06/23 9757  Chief Complaint(s) Chest Pain  HPI Donna Francis is a 63 y.o. female with a past medical history listed below here for 3 weeks of substernal chest cramping, abdominal cramping and myalgias.  Similar episodes in the past related to irritable bowel syndrome and GERD.  Patient has increased anxiety over the past several weeks as well due to family issues.  States that she is having difficulty sleeping.  Denies any recent fevers or infections.  No cough or congestion.  She does endorse nausea without emesis.  No constipation, diarrhea.  No urinary symptoms.   The history is provided by the patient.    Past Medical History Past Medical History:  Diagnosis Date   Allergy    Anemia    Anxiety    Asthma    pt states diagnosed by Pulmonologist   Colitis    Fibromyalgia    GERD (gastroesophageal reflux disease)    pt reports buringing in abdomen that has recently started moving up higher into her chest   Heart rate fast    IBS (irritable bowel syndrome)    Interstitial cystitis    Osteopenia after menopause    Panic attacks    Rapid heartbeat    Rheumatoid arthritis (HCC)    Seizures (HCC)    UTI (urinary tract infection)    Varicose veins of bilateral lower extremities with pain    Patient Active Problem List   Diagnosis Date Noted   Precordial pain 04/16/2020   Shortness of breath 04/16/2020   Mixed hyperlipidemia 04/16/2020   Abdominal pain 04/14/2020   Atypical chest pain 04/14/2020   Weight loss 04/14/2020   Allergy    Asthma    Colitis    Fibromyalgia    GERD (gastroesophageal reflux disease)    IBS (irritable bowel syndrome)    Interstitial cystitis    Panic attacks    Rapid heartbeat    Rheumatoid arthritis (HCC)    Seizures (HCC)    UTI (urinary tract infection)    Varicose veins of bilateral lower extremities with pain     Malnutrition (HCC) 12/17/2019   Seizure-like activity (HCC) 10/18/2017   Anxiety 03/08/2016   Anemia, unspecified 12/25/2015   Palpitations 12/25/2015   Episodic mood disorder (HCC) 11/17/2010   Generalized anxiety disorder 11/17/2010   Home Medication(s) Prior to Admission medications   Medication Sig Start Date End Date Taking? Authorizing Provider  acetaminophen  (TYLENOL ) 500 MG tablet Take 500 mg by mouth 3 (three) times daily. Patient not taking: Reported on 11/29/2022    [provider]  ALPRAZolam  (XANAX ) 1 MG tablet Take 1 mg by mouth 4 (four) times daily.    [provider]  Alum & Mag Hydroxide-Simeth (MYLANTA PO) Take 1 tablet by mouth as needed.    [provider]  dicyclomine  (BENTYL ) 10 MG capsule Take 1 capsule (10 mg total) by mouth 3 (three) times daily as needed for spasms. 06/18/22   Charlanne Groom, MD  dicyclomine  (BENTYL ) 10 MG capsule Take 1 capsule (10 mg total) by mouth 2 (two) times daily as needed for spasms. 05/11/23   Charlanne Groom, MD  dicyclomine  (BENTYL ) 20 MG tablet Take 1 tablet (20 mg total) by mouth 2 (two) times daily as needed for spasms. Please break into 1/4 04/01/23   Charlanne Groom, MD  lidocaine  (XYLOCAINE ) 2 % solution Use as directed 15 mLs in the mouth  or throat as needed for mouth pain.    [provider]  loratadine (CLARITIN) 5 MG chewable tablet Chew 10 mg by mouth daily.    [provider]  montelukast (SINGULAIR) 5 MG chewable tablet Chew 5 mg by mouth daily. 11/13/20   [provider]  ondansetron  (ZOFRAN -ODT) 4 MG disintegrating tablet DISSOLVE 1 TABLET IN MOUTH EVERY 6 HOURS AS NEEDED FOR NAUSEA FOR VOMITING 08/07/21   Charlanne Groom, MD  ondansetron  (ZOFRAN -ODT) 4 MG disintegrating tablet Take 1 tablet (4 mg total) by mouth every 8 (eight) hours as needed. 11/17/23   Dean Clarity, MD  pantoprazole  (PROTONIX ) 20 MG tablet Take 1 tablet (20 mg total) by mouth daily. 05/11/23   Charlanne Groom, MD   polyethylene glycol powder (GLYCOLAX /MIRALAX ) 17 GM/SCOOP powder Take 17 g by mouth daily. 08/31/23   Charlanne Groom, MD                                                                                                                                    Allergies Fentanyl, Haldol  [haloperidol ], Kenalog [triamcinolone ], Propofol, and Proton pump inhibitors  Review of Systems Review of Systems As noted in HPI  Physical Exam Vital Signs  I have reviewed the triage vital signs BP 136/83   Pulse 98   Temp 98.2 F (36.8 C) (Oral)   Resp 18   Ht 4' 8 (1.422 m)   Wt 33 kg   SpO2 98%   BMI 16.31 kg/m   Physical Exam Vitals reviewed.  Constitutional:      General: She is not in acute distress.    Appearance: She is well-developed. She is not diaphoretic.  HENT:     Head: Normocephalic and atraumatic.     Nose: Nose normal.  Eyes:     General: No scleral icterus.       Right eye: No discharge.        Left eye: No discharge.     Conjunctiva/sclera: Conjunctivae normal.     Pupils: Pupils are equal, round, and reactive to light.  Cardiovascular:     Rate and Rhythm: Normal rate and regular rhythm.     Heart sounds: No murmur heard.    No friction rub. No gallop.  Pulmonary:     Effort: Pulmonary effort is normal. No respiratory distress.     Breath sounds: Normal breath sounds. No stridor. No rales.  Abdominal:     General: There is no distension.     Palpations: Abdomen is soft.     Tenderness: There is no abdominal tenderness.  Musculoskeletal:        General: No tenderness.     Cervical back: Normal range of motion and neck supple.     Comments: Sensitive to touch throughout entire body  Skin:    General: Skin is warm and dry.     Findings: No erythema or rash.  Neurological:  Mental Status: She is alert and oriented to person, place, and time.  Psychiatric:        Mood and Affect: Mood is anxious.     ED Results and Treatments Labs (all labs ordered are listed,  but only abnormal results are displayed) Labs Reviewed  COMPREHENSIVE METABOLIC PANEL WITH GFR - Abnormal; Notable for the following components:      Result Value   Sodium 128 (*)    Chloride 92 (*)    BUN 5 (*)    All other components within normal limits  LIPASE, BLOOD - Abnormal; Notable for the following components:   Lipase 64 (*)    All other components within normal limits  CBC WITH DIFFERENTIAL/PLATELET  TROPONIN I (HIGH SENSITIVITY)                                                                                                                         EKG  EKG Interpretation Date/Time:  Tuesday December 06 2023 03:01:47 EDT Ventricular Rate:  96 PR Interval:  172 QRS Duration:  90 QT Interval:  346 QTC Calculation: 438 R Axis:   87  Text Interpretation: Sinus rhythm Right atrial enlargement Borderline right axis deviation RSR' in V1 or V2, probably normal variant ST elevation, consider inferior injury No significant change was found compared to prior Confirmed by Trine Likes [phone]) on 12/06/2023 3:14:37 AM       Radiology No results found.  Medications Ordered in ED Medications  sodium chloride  0.9 % bolus 1,000 mL (0 mLs Intravenous Stopped 12/06/23 0645)  ondansetron  (ZOFRAN ) injection 4 mg (4 mg Intravenous Given 12/06/23 0434)  alum & mag hydroxide-simeth (MAALOX/MYLANTA) 200-200-20 MG/5ML suspension 30 mL (30 mLs Oral Given 12/06/23 0548)    And  lidocaine  (XYLOCAINE ) 2 % viscous mouth solution 15 mL (15 mLs Oral Given 12/06/23 0548)  ALPRAZolam  (XANAX ) tablet 1 mg (1 mg Oral Given 12/06/23 9386)   Procedures Procedures  (including critical care time) Medical Decision Making / ED Course   Medical Decision Making Amount and/or Complexity of Data Reviewed Labs: ordered. Decision-making details documented in ED Course. ECG/medicine tests: ordered and independent interpretation performed. Decision-making details documented in ED Course.  Risk OTC  drugs. Prescription drug management.    Chest pain is highly atypical for ACS.  EKG without acute ischemic changes or evidence of pericarditis.  Troponin negative.  No respiratory symptoms concerning for pneumonia, pneumothorax.  Doubt PE.  CBC without leukocytosis or anemia.  CMP with mild hyponatremia.  No other electrolyte derangements or renal sufficiency.  No evidence of bili obstruction.  Mildly elevated lipase but not concerning for pancreatitis.  Patient provided with IV fluids, antiemetics and GI cocktail which provided relief.  Patient given her scheduled Xanax  which relieved her anxiousness.  Patient is not feeling better well enough to go home    Final Clinical Impression(s) / ED Diagnoses Final diagnoses:  Anxiousness  Myalgia   The patient appears reasonably screened and/or stabilized for  discharge and I doubt any other medical condition or other [facility] requiring further screening, evaluation, or treatment in the ED at this time. I have discussed the findings, Dx and Tx plan with the patient/family who expressed understanding and agree(s) with the plan. Discharge instructions discussed at length. The patient/family was given strict return precautions who verbalized understanding of the instructions. No further questions at time of discharge.  Disposition: Discharge  Condition: Good  ED Discharge Orders     None        Follow Up: Primary care provider  Call  to schedule an appointment for close follow up    This chart was dictated using voice recognition software.  Despite best efforts to proofread,  errors can occur which can change the documentation meaning.    Trine Raynell Moder, MD 12/06/23 [phone]

## 2023-12-06 NOTE — ED Triage Notes (Signed)
 Pt bib [facility] from home with complaints of chest pain that worsened tonight, insomnia/ generalized weakness and pain for months. Per pt her PCP told her that she has vitamin D deficiency. Pt given 324mg  ASA en route due to chest pain. AOx4. Resp EU

## 2023-12-08 ENCOUNTER — Telehealth: Payer: Self-pay | Admitting: Internal Medicine

## 2023-12-08 NOTE — Telephone Encounter (Signed)
 Not so sure what is happening-multiple complaints. She can try MiraLAX  17 g p.o. daily until good bowel movement or milk of magnesia. If not better she needs to go to ED. RG

## 2023-12-08 NOTE — Telephone Encounter (Signed)
 Received a page to the [facility] on call pager. Patient continues to have issues with ab pain that she described in clinic recently. She has N&V, which has worsened as the day has gone on. She was able to eat some small amounts of food earlier today. Now she can only drink liquids in the evening. Has had difficulty eating her dinner. She has not been sleeping well. She has not responded to a lot medications but now makes her own GI cocktail that helps a little bit. She has constipation. She is using some suppositories and has stopped performing anal stimulation since this was causing some bleeding. I told her it would be okay to use suppositories and/or enemas to help with constipation while she has nausea issues. She did have a stool earlier today. From reviewing Dr. Ira last note, she has been refractory to several therapies in the past. I told the patient that if her symptoms were to worsen overnight, she could proceed to the ED for further evaluation. Will CC Dr. Charlanne to this telephone note to see if he has any additional suggestions.

## 2023-12-09 NOTE — Telephone Encounter (Signed)
 Spoke to her Thursday around 115am and she did go to the ED on 7-15 and they told her that her mental and not getting adequate sleep is what causing everything else to start to acting out of wack. She was referred to her AVS and she told me that she was trying to see about doing a zoom call to see about a psychiatrist. I gave her 2 numbers well for her for the help line one at [facility] and [place] help line too. Says that her stomach is achy and that her poop smell is stronger than normal and can have burning in the stomach. Can have sharp pain but the stomach can act up when she feels like she has to use the bathroom. And she wants to know can the stool fermented in her colon since it has been seating there. She was told by her PCP to do the dulcolax supp over the glycerin supp and per the nurse here she said that was fine. Please advise

## 2023-12-11 ENCOUNTER — Emergency Department (HOSPITAL_COMMUNITY)

## 2023-12-11 ENCOUNTER — Observation Stay (HOSPITAL_COMMUNITY)

## 2023-12-11 ENCOUNTER — Observation Stay (HOSPITAL_COMMUNITY)
Admission: EM | Admit: 2023-12-11 | Discharge: 2023-12-13 | Disposition: A | Discharge status: Home / Self Care | Attending: Internal Medicine | Admitting: Internal Medicine

## 2023-12-11 ENCOUNTER — Other Ambulatory Visit: Payer: Self-pay

## 2023-12-11 ENCOUNTER — Encounter (HOSPITAL_COMMUNITY): Payer: Self-pay

## 2023-12-11 DIAGNOSIS — F4323 Adjustment disorder with mixed anxiety and depressed mood: Secondary | ICD-10-CM | POA: Diagnosis not present

## 2023-12-11 DIAGNOSIS — R55 Syncope and collapse: Principal | ICD-10-CM | POA: Insufficient documentation

## 2023-12-11 DIAGNOSIS — R29818 Other symptoms and signs involving the nervous system: Secondary | ICD-10-CM | POA: Insufficient documentation

## 2023-12-11 DIAGNOSIS — E782 Mixed hyperlipidemia: Secondary | ICD-10-CM | POA: Diagnosis present

## 2023-12-11 DIAGNOSIS — R404 Transient alteration of awareness: Secondary | ICD-10-CM | POA: Diagnosis not present

## 2023-12-11 DIAGNOSIS — M069 Rheumatoid arthritis, unspecified: Secondary | ICD-10-CM | POA: Insufficient documentation

## 2023-12-11 DIAGNOSIS — F419 Anxiety disorder, unspecified: Secondary | ICD-10-CM | POA: Diagnosis not present

## 2023-12-11 DIAGNOSIS — Z681 Body mass index (BMI) 19 or less, adult: Secondary | ICD-10-CM | POA: Insufficient documentation

## 2023-12-11 DIAGNOSIS — K219 Gastro-esophageal reflux disease without esophagitis: Secondary | ICD-10-CM | POA: Diagnosis present

## 2023-12-11 DIAGNOSIS — R109 Unspecified abdominal pain: Secondary | ICD-10-CM | POA: Diagnosis present

## 2023-12-11 DIAGNOSIS — F410 Panic disorder [episodic paroxysmal anxiety] without agoraphobia: Secondary | ICD-10-CM

## 2023-12-11 DIAGNOSIS — D649 Anemia, unspecified: Secondary | ICD-10-CM | POA: Diagnosis present

## 2023-12-11 DIAGNOSIS — R479 Unspecified speech disturbances: Secondary | ICD-10-CM | POA: Diagnosis not present

## 2023-12-11 DIAGNOSIS — F449 Dissociative and conversion disorder, unspecified: Secondary | ICD-10-CM

## 2023-12-11 DIAGNOSIS — R4182 Altered mental status, unspecified: Secondary | ICD-10-CM | POA: Diagnosis present

## 2023-12-11 DIAGNOSIS — E871 Hypo-osmolality and hyponatremia: Secondary | ICD-10-CM | POA: Diagnosis not present

## 2023-12-11 DIAGNOSIS — R9431 Abnormal electrocardiogram [ECG] [EKG]: Secondary | ICD-10-CM | POA: Diagnosis not present

## 2023-12-11 DIAGNOSIS — Z1152 Encounter for screening for COVID-19: Secondary | ICD-10-CM | POA: Insufficient documentation

## 2023-12-11 DIAGNOSIS — R634 Abnormal weight loss: Secondary | ICD-10-CM | POA: Diagnosis not present

## 2023-12-11 DIAGNOSIS — F202 Catatonic schizophrenia: Secondary | ICD-10-CM | POA: Insufficient documentation

## 2023-12-11 DIAGNOSIS — R1084 Generalized abdominal pain: Secondary | ICD-10-CM | POA: Insufficient documentation

## 2023-12-11 DIAGNOSIS — Z79899 Other long term (current) drug therapy: Secondary | ICD-10-CM | POA: Diagnosis not present

## 2023-12-11 DIAGNOSIS — R4789 Other speech disturbances: Secondary | ICD-10-CM | POA: Diagnosis not present

## 2023-12-11 DIAGNOSIS — M059 Rheumatoid arthritis with rheumatoid factor, unspecified: Secondary | ICD-10-CM | POA: Insufficient documentation

## 2023-12-11 DIAGNOSIS — K589 Irritable bowel syndrome without diarrhea: Secondary | ICD-10-CM | POA: Diagnosis not present

## 2023-12-11 DIAGNOSIS — K588 Other irritable bowel syndrome: Secondary | ICD-10-CM

## 2023-12-11 LAB — DIFFERENTIAL
Abs Immature Granulocytes: 0.04 K/uL (ref 0.00–0.07)
Basophils Absolute: 0 K/uL (ref 0.0–0.1)
Basophils Relative: 0 %
Eosinophils Absolute: 0 K/uL (ref 0.0–0.5)
Eosinophils Relative: 0 %
Immature Granulocytes: 1 %
Lymphocytes Relative: 24 %
Lymphs Abs: 1.4 K/uL (ref 0.7–4.0)
Monocytes Absolute: 0.8 K/uL (ref 0.1–1.0)
Monocytes Relative: 14 %
Neutro Abs: 3.6 K/uL (ref 1.7–7.7)
Neutrophils Relative %: 61 %

## 2023-12-11 LAB — HEMOGLOBIN A1C
Hgb A1c MFr Bld: 5.1 % (ref 4.8–5.6)
Mean Plasma Glucose: 99.67 mg/dL

## 2023-12-11 LAB — COMPREHENSIVE METABOLIC PANEL WITH GFR
ALT: 23 U/L (ref 0–44)
AST: 25 U/L (ref 15–41)
Albumin: 3.8 g/dL (ref 3.5–5.0)
Alkaline Phosphatase: 61 U/L (ref 38–126)
Anion gap: 12 (ref 5–15)
BUN: 5 mg/dL — ABNORMAL LOW (ref 8–23)
CO2: 24 mmol/L (ref 22–32)
Calcium: 9.3 mg/dL (ref 8.9–10.3)
Chloride: 90 mmol/L — ABNORMAL LOW (ref 98–111)
Creatinine, Ser: 0.54 mg/dL (ref 0.44–1.00)
GFR, Estimated: >60 mL/min (ref >60)
Glucose, Bld: 99 mg/dL (ref 70–99)
Potassium: 3.7 mmol/L (ref 3.5–5.1)
Sodium: 126 mmol/L — ABNORMAL LOW (ref 135–145)
Total Bilirubin: 0.6 mg/dL (ref 0.0–1.2)
Total Protein: 7.3 g/dL (ref 6.5–8.1)

## 2023-12-11 LAB — CK: Total CK: 65 U/L (ref 38–234)

## 2023-12-11 LAB — I-STAT CHEM 8, ED
BUN: 3 mg/dL — ABNORMAL LOW (ref 8–23)
Calcium, Ion: 1.09 mmol/L — ABNORMAL LOW (ref 1.15–1.40)
Chloride: 91 mmol/L — ABNORMAL LOW (ref 98–111)
Creatinine, Ser: 0.7 mg/dL (ref 0.44–1.00)
Glucose, Bld: 97 mg/dL (ref 70–99)
HCT: 40 % (ref 36.0–46.0)
Hemoglobin: 13.6 g/dL (ref 12.0–15.0)
Potassium: 3.6 mmol/L (ref 3.5–5.1)
Sodium: 126 mmol/L — ABNORMAL LOW (ref 135–145)
TCO2: 23 mmol/L (ref 22–32)

## 2023-12-11 LAB — CBC WITH DIFFERENTIAL/PLATELET
Abs Immature Granulocytes: 0.02 K/uL (ref 0.00–0.07)
Basophils Absolute: 0 K/uL (ref 0.0–0.1)
Basophils Relative: 0 %
Eosinophils Absolute: 0 K/uL (ref 0.0–0.5)
Eosinophils Relative: 0 %
HCT: 35.6 % — ABNORMAL LOW (ref 36.0–46.0)
Hemoglobin: 12 g/dL (ref 12.0–15.0)
Immature Granulocytes: 0 %
Lymphocytes Relative: 28 %
Lymphs Abs: 1.5 K/uL (ref 0.7–4.0)
MCH: 29.9 pg (ref 26.0–34.0)
MCHC: 33.7 g/dL (ref 30.0–36.0)
MCV: 88.6 fL (ref 80.0–100.0)
Monocytes Absolute: 0.6 K/uL (ref 0.1–1.0)
Monocytes Relative: 12 %
Neutro Abs: 3.2 K/uL (ref 1.7–7.7)
Neutrophils Relative %: 60 %
Platelets: 283 K/uL (ref 150–400)
RBC: 4.02 MIL/uL (ref 3.87–5.11)
RDW: 11.4 % — ABNORMAL LOW (ref 11.5–15.5)
WBC: 5.3 K/uL (ref 4.0–10.5)
nRBC: 0 % (ref 0.0–0.2)

## 2023-12-11 LAB — HEPATIC FUNCTION PANEL
ALT: 20 U/L (ref 0–44)
AST: 22 U/L (ref 15–41)
Albumin: 3.3 g/dL — ABNORMAL LOW (ref 3.5–5.0)
Alkaline Phosphatase: 59 U/L (ref 38–126)
Bilirubin, Direct: <0.1 mg/dL (ref 0.0–0.2)
Total Bilirubin: 0.5 mg/dL (ref 0.0–1.2)
Total Protein: 6.5 g/dL (ref 6.5–8.1)

## 2023-12-11 LAB — PHOSPHORUS: Phosphorus: 2.5 mg/dL (ref 2.5–4.6)

## 2023-12-11 LAB — AMMONIA: Ammonia: 25 umol/L (ref 9–35)

## 2023-12-11 LAB — RESP PANEL BY RT-PCR (RSV, FLU A&B, COVID)  RVPGX2
Influenza A by PCR: NEGATIVE
Influenza B by PCR: NEGATIVE
Resp Syncytial Virus by PCR: NEGATIVE
SARS Coronavirus 2 by RT PCR: NEGATIVE

## 2023-12-11 LAB — CBC
HCT: 37.9 % (ref 36.0–46.0)
Hemoglobin: 12.7 g/dL (ref 12.0–15.0)
MCH: 30.2 pg (ref 26.0–34.0)
MCHC: 33.5 g/dL (ref 30.0–36.0)
MCV: 90.2 fL (ref 80.0–100.0)
Platelets: 291 K/uL (ref 150–400)
RBC: 4.2 MIL/uL (ref 3.87–5.11)
RDW: 11.3 % — ABNORMAL LOW (ref 11.5–15.5)
WBC: 5.9 K/uL (ref 4.0–10.5)
nRBC: 0 % (ref 0.0–0.2)

## 2023-12-11 LAB — HIV ANTIBODY (ROUTINE TESTING W REFLEX): HIV Screen 4th Generation wRfx: NONREACTIVE

## 2023-12-11 LAB — TSH: TSH: 0.994 u[IU]/mL (ref 0.350–4.500)

## 2023-12-11 LAB — APTT: aPTT: 25 s (ref 24–36)

## 2023-12-11 LAB — MAGNESIUM: Magnesium: 2.2 mg/dL (ref 1.7–2.4)

## 2023-12-11 LAB — PROTIME-INR
INR: 1 (ref 0.8–1.2)
Prothrombin Time: 13.2 s (ref 11.4–15.2)

## 2023-12-11 LAB — ETHANOL: Alcohol, Ethyl (B): <15 mg/dL (ref <15)

## 2023-12-11 LAB — OSMOLALITY: Osmolality: 274 mosm/kg — ABNORMAL LOW (ref 275–295)

## 2023-12-11 LAB — PROCALCITONIN: Procalcitonin: <0.1 ng/mL

## 2023-12-11 MED ORDER — THIAMINE HCL 100 MG/ML IJ SOLN
100.0000 mg | Freq: Every day | INTRAMUSCULAR | Status: DC
  Administered 2023-12-11 – 2023-12-12 (×2): 100 mg via INTRAVENOUS
  Filled 2023-12-11 (×2): qty 2

## 2023-12-11 MED ORDER — STROKE: EARLY STAGES OF RECOVERY BOOK
Freq: Once | Status: AC
  Filled 2023-12-11: qty 1

## 2023-12-11 MED ORDER — ASPIRIN 300 MG RE SUPP
300.0000 mg | Freq: Every day | RECTAL | Status: DC
  Filled 2023-12-11: qty 1

## 2023-12-11 MED ORDER — POLYETHYLENE GLYCOL 3350 17 G PO PACK
17.0000 g | PACK | Freq: Every day | ORAL | Status: DC
  Administered 2023-12-11 – 2023-12-13 (×3): 17 g via ORAL
  Filled 2023-12-11 (×3): qty 1

## 2023-12-11 MED ORDER — IOHEXOL 350 MG/ML SOLN
100.0000 mL | Freq: Once | INTRAVENOUS | Status: AC | PRN
  Administered 2023-12-11: 100 mL via INTRAVENOUS

## 2023-12-11 MED ORDER — ACETAMINOPHEN 160 MG/5ML PO SOLN: 650.0000 mg | ORAL | Status: DC | PRN

## 2023-12-11 MED ORDER — PANTOPRAZOLE SODIUM 20 MG PO TBEC
20.0000 mg | DELAYED_RELEASE_TABLET | Freq: Every day | ORAL | Status: DC
  Administered 2023-12-13: 20 mg via ORAL
  Filled 2023-12-11 (×2): qty 1

## 2023-12-11 MED ORDER — THIAMINE HCL 100 MG/ML IJ SOLN
400.0000 mg | Freq: Once | INTRAVENOUS | Status: AC
  Administered 2023-12-12: 400 mg via INTRAVENOUS
  Filled 2023-12-11: qty 4

## 2023-12-11 MED ORDER — SODIUM CHLORIDE 0.9 % IV BOLUS
500.0000 mL | Freq: Once | INTRAVENOUS | Status: AC
  Administered 2023-12-11: 500 mL via INTRAVENOUS

## 2023-12-11 MED ORDER — ACETAMINOPHEN 650 MG RE SUPP: 650.0000 mg | RECTAL | Status: DC | PRN

## 2023-12-11 MED ORDER — ASPIRIN 325 MG PO TABS
325.0000 mg | ORAL_TABLET | Freq: Every day | ORAL | Status: DC
  Administered 2023-12-11 – 2023-12-12 (×2): 325 mg via ORAL
  Filled 2023-12-11 (×2): qty 1

## 2023-12-11 MED ORDER — SODIUM CHLORIDE 0.9 % IV SOLN: INTRAVENOUS | Status: DC

## 2023-12-11 MED ORDER — MONTELUKAST SODIUM 10 MG PO TABS
5.0000 mg | ORAL_TABLET | Freq: Every day | ORAL | Status: DC
  Administered 2023-12-12: 5 mg via ORAL
  Filled 2023-12-11: qty 1

## 2023-12-11 MED ORDER — DICYCLOMINE HCL 10 MG PO CAPS: 10.0000 mg | ORAL_CAPSULE | Freq: Three times a day (TID) | ORAL | Status: DC | PRN

## 2023-12-11 MED ORDER — ALPRAZOLAM 0.5 MG PO TABS
1.0000 mg | ORAL_TABLET | Freq: Four times a day (QID) | ORAL | Status: DC
  Administered 2023-12-11 – 2023-12-12 (×5): 1 mg via ORAL
  Filled 2023-12-11 (×5): qty 2

## 2023-12-11 MED ORDER — SODIUM CHLORIDE 0.9% FLUSH: 3.0000 mL | Freq: Once | INTRAVENOUS | Status: DC

## 2023-12-11 MED ORDER — ACETAMINOPHEN 325 MG PO TABS
650.0000 mg | ORAL_TABLET | ORAL | Status: DC | PRN
  Administered 2023-12-11 – 2023-12-13 (×5): 650 mg via ORAL
  Filled 2023-12-11 (×5): qty 2

## 2023-12-11 MED ORDER — LORATADINE 10 MG PO TABS
10.0000 mg | ORAL_TABLET | Freq: Every day | ORAL | Status: DC
  Administered 2023-12-12: 10 mg via ORAL
  Filled 2023-12-11: qty 1

## 2023-12-11 MED ORDER — ALUM & MAG HYDROXIDE-SIMETH 200-200-20 MG/5ML PO SUSP
30.0000 mL | Freq: Once | ORAL | Status: AC
  Administered 2023-12-11: 30 mL via ORAL
  Filled 2023-12-11: qty 30

## 2023-12-11 MED ORDER — ONDANSETRON HCL 4 MG/2ML IJ SOLN
4.0000 mg | Freq: Once | INTRAMUSCULAR | Status: AC
  Administered 2023-12-11: 4 mg via INTRAVENOUS
  Filled 2023-12-11: qty 2

## 2023-12-11 NOTE — Assessment & Plan Note (Signed)
 Unclear episode.  Patient states that she felt like she was getting a stroke. Apparently was poorly responsive no localized neurological deficits but overall generally weak MRI pending EEG ordered Patient has history of pseudoseizures This could be also a catatonia like state in the setting of significant anxiety/depression Appreciate behavioral health consult as well

## 2023-12-11 NOTE — ED Provider Notes (Signed)
 [place] EMERGENCY DEPARTMENT AT [facility] Provider Note   CSN: [id number] Arrival date & time: 12/11/23  8283     Patient presents with: No chief complaint on file.   Donna Francis is a 63 y.o. female.   The history is provided by the patient and medical records. The history is limited by the condition of the patient. No language interpreter was used.  Neurologic Problem This is a new problem. The current episode started 6 to 12 hours ago. The problem occurs constantly. The problem has not changed since onset.Pertinent negatives include no chest pain, no abdominal pain, no headaches and no shortness of breath. Nothing aggravates the symptoms. Nothing relieves the symptoms. She has tried nothing for the symptoms. The treatment provided no relief.       Prior to Admission medications   Medication Sig Start Date End Date Taking? Authorizing Provider  acetaminophen  (TYLENOL ) 500 MG tablet Take 500 mg by mouth 3 (three) times daily. Patient not taking: Reported on 11/29/2022    [provider]  ALPRAZolam  (XANAX ) 1 MG tablet Take 1 mg by mouth 4 (four) times daily.    [provider]  Alum & Mag Hydroxide-Simeth (MYLANTA PO) Take 1 tablet by mouth as needed.    [provider]  dicyclomine  (BENTYL ) 10 MG capsule Take 1 capsule (10 mg total) by mouth 3 (three) times daily as needed for spasms. 06/18/22   Charlanne Groom, MD  dicyclomine  (BENTYL ) 10 MG capsule Take 1 capsule (10 mg total) by mouth 2 (two) times daily as needed for spasms. 05/11/23   Charlanne Groom, MD  dicyclomine  (BENTYL ) 20 MG tablet Take 1 tablet (20 mg total) by mouth 2 (two) times daily as needed for spasms. Please break into 1/4 04/01/23   Charlanne Groom, MD  lidocaine  (XYLOCAINE ) 2 % solution Use as directed 15 mLs in the mouth or throat as needed for mouth pain.    [provider]  loratadine  (CLARITIN ) 5 MG chewable tablet Chew 10 mg by mouth daily.    [provider]  montelukast  (SINGULAIR ) 5 MG chewable tablet Chew 5 mg by mouth daily. 11/13/20   [provider]  ondansetron  (ZOFRAN -ODT) 4 MG disintegrating tablet DISSOLVE 1 TABLET IN MOUTH EVERY 6 HOURS AS NEEDED FOR NAUSEA FOR VOMITING 08/07/21   Charlanne Groom, MD  ondansetron  (ZOFRAN -ODT) 4 MG disintegrating tablet Take 1 tablet (4 mg total) by mouth every 8 (eight) hours as needed. 11/17/23   Dean Clarity, MD  pantoprazole  (PROTONIX ) 20 MG tablet Take 1 tablet (20 mg total) by mouth daily. 05/11/23   Charlanne Groom, MD  polyethylene glycol powder (GLYCOLAX /MIRALAX ) 17 GM/SCOOP powder Take 17 g by mouth daily. 08/31/23   Charlanne Groom, MD    Allergies: Fentanyl, Haldol  [haloperidol ], Kenalog [triamcinolone ], Propofol, and Proton pump inhibitors    Review of Systems  Constitutional:  Negative for chills, diaphoresis, fatigue and fever.  HENT:  Negative for congestion.   Eyes:  Negative for visual disturbance.  Respiratory:  Positive for cough. Negative for chest tightness, shortness of breath and wheezing.   Cardiovascular:  Negative for chest pain, palpitations and leg swelling.  Gastrointestinal:  Negative for abdominal pain, constipation, diarrhea, nausea and vomiting.  Genitourinary:  Negative for dysuria, flank pain and frequency.  Musculoskeletal:  Negative for back pain, neck pain and neck stiffness.  Skin:  Negative for rash and wound.  Neurological:  Positive for speech difficulty. Negative for weakness, light-headedness, numbness and headaches.  Psychiatric/Behavioral:  Negative for agitation. The patient is not nervous/anxious.   All other systems reviewed and are negative.   Updated Vital Signs BP (!) 153/80 (BP Location: Left Arm)   Pulse (!) 118   Temp 97.9 F (36.6 C) (Oral)   Resp 17   Wt 37 kg   SpO2 100%   BMI 18.29 kg/m   Physical Exam Vitals and nursing note reviewed.  Constitutional:      General: She is not in acute distress.    Appearance:  She is well-developed. She is not ill-appearing, toxic-appearing or diaphoretic.  HENT:     Head: Normocephalic and atraumatic.     Nose: No congestion or rhinorrhea.     Mouth/Throat:     Mouth: Mucous membranes are moist.  Eyes:     Extraocular Movements: Extraocular movements intact.     Conjunctiva/sclera: Conjunctivae normal.     Pupils: Pupils are equal, round, and reactive to light.  Cardiovascular:     Rate and Rhythm: Normal rate and regular rhythm.     Heart sounds: No murmur heard. Pulmonary:     Effort: Pulmonary effort is normal. No respiratory distress.     Breath sounds: Normal breath sounds. No wheezing, rhonchi or rales.  Chest:     Chest wall: No tenderness.  Abdominal:     Palpations: Abdomen is soft.     Tenderness: There is no abdominal tenderness. There is no guarding or rebound.  Musculoskeletal:        General: No swelling.     Cervical back: Neck supple. No tenderness.  Skin:    General: Skin is warm and dry.     Capillary Refill: Capillary refill takes less than 2 seconds.     Findings: No erythema.  Neurological:     Mental Status: She is alert.     Sensory: No sensory deficit.     Motor: No weakness.     (all labs ordered are listed, but only abnormal results are displayed) Labs Reviewed  CBC - Abnormal; Notable for the following components:      Result Value   RDW 11.3 (*)    All other components within normal limits  COMPREHENSIVE METABOLIC PANEL WITH GFR - Abnormal; Notable for the following components:   Sodium 126 (*)    Chloride 90 (*)    BUN 5 (*)    All other components within normal limits  I-STAT CHEM 8, ED - Abnormal; Notable for the following components:   Sodium 126 (*)    Chloride 91 (*)    BUN 3 (*)    Calcium, Ion 1.09 (*)    All other components within normal limits  RESP PANEL BY RT-PCR (RSV, FLU A&B, COVID)  RVPGX2  PROTIME-INR  APTT  DIFFERENTIAL  ETHANOL  TSH  RAPID URINE DRUG SCREEN, HOSP PERFORMED   CREATININE, URINE, RANDOM  OSMOLALITY, URINE  OSMOLALITY  SODIUM, URINE, RANDOM  PREALBUMIN  PROCALCITONIN  AMMONIA  CBC WITH DIFFERENTIAL/PLATELET  CK  HEPATIC FUNCTION PANEL  MAGNESIUM  PHOSPHORUS  VITAMIN B1    EKG: EKG Interpretation Date/Time:  Sunday December 11 2023 17:58:49 EDT Ventricular Rate:  114 PR Interval:  152 QRS Duration:  96 QT Interval:  326 QTC Calculation: 449 R Axis:   90  Text Interpretation: Sinus tachycardia Biatrial enlargement Consider RVH w/ secondary repol abnormality when compared to prior, faster rate No sTEMI Confirmed by Ginger Barefoot ([id number]) on 12/11/2023 6:10:58 PM  Radiology: DG Chest Portable 1 View Result Date:  12/11/2023 CLINICAL DATA:  Code stroke cp cough EXAM: PORTABLE CHEST 1 VIEW COMPARISON:  Chest x-ray 10/29/2021 FINDINGS: The heart and mediastinal contours are within normal limits. 2 cm nodularity overlying the lower lobes consistent with nipple shadows. No focal consolidation. No pulmonary edema. No pleural effusion. No pneumothorax. No acute osseous abnormality. IMPRESSION: No active disease. Electronically Signed   By: Morgane  Naveau M.D.   On: 12/11/2023 19:40   CT ANGIO HEAD NECK W WO CM W PERF (CODE STROKE) Result Date: 12/11/2023 CLINICAL DATA:  Provided history: Neuro deficit, acute, stroke suspected. EXAM: CT ANGIOGRAPHY HEAD AND NECK CT PERFUSION BRAIN TECHNIQUE: Multidetector CT imaging of the head and neck was performed using the standard protocol during bolus administration of intravenous contrast. Multiplanar CT image reconstructions and MIPs were obtained to evaluate the vascular anatomy. Carotid stenosis measurements (when applicable) are obtained utilizing NASCET criteria, using the distal internal carotid diameter as the denominator. Multiphase CT imaging of the brain was performed following IV bolus contrast injection. Subsequent parametric perfusion maps were calculated using RAPID software. RADIATION DOSE REDUCTION:  This exam was performed according to the departmental dose-optimization program which includes automated exposure control, adjustment of the mA and/or kV according to patient size and/or use of iterative reconstruction technique. CONTRAST:  Administered contrast not known at this time. COMPARISON:  Non-contrast head CT performed earlier today 12/11/2023. FINDINGS: CTA NECK FINDINGS Aortic arch: Common origin of the innominate and left common carotid arteries. Mild despite plaque within the proximal right subclavian artery. No hemodynamically significant innominate or proximal subclavian artery stenosis. Right carotid system: CCA and ICA patent within the neck without stenosis or significant atherosclerotic disease. Left carotid system: CCA and ICA patent within the neck without stenosis or significant atherosclerotic disease. Vertebral arteries: Codominant and patent within the neck without stenosis or significant atherosclerotic disease. Skeleton: Nonspecific reversal of the expected cervical lordosis. Cervical spondylosis. No acute fracture or aggressive osseous lesion. Other neck: No neck mass or cervical lymphadenopathy Upper chest: No consolidation within the imaged lung apices. Biapical pleuroparenchymal scarring. Review of the MIP images confirms the above findings CTA HEAD FINDINGS Anterior circulation: The intracranial internal carotid arteries are patent. Atherosclerotic plaque within both vessels. Most notably, there is up to moderate stenosis of the cavernous right ICA. The M1 middle cerebral arteries are patent. No M2 proximal branch occlusion or high-grade proximal stenosis. The anterior cerebral arteries are patent. Hypoplastic right A1 segment. No intracranial aneurysm is identified. Posterior circulation: The intracranial vertebral arteries are patent. The basilar artery is patent. The posterior cerebral arteries are patent. Posterior communicating arteries are present bilaterally. Venous sinuses:  Within the limitations of contrast timing, no convincing thrombus. Anatomic variants: As described. Review of the MIP images confirms the above findings CT Brain Perfusion Findings: CBF (<30%) Volume: 0mL Perfusion (Tmax>6.0s) volume: 0mL Mismatch Volume: 0mL Infarction Location: None identified. IMPRESSION: CTA neck: The common carotid, internal carotid and vertebral arteries are patent within the neck without stenosis or significant atherosclerotic disease. CTA head: No proximal intracranial large vessel occlusion or high-grade proximal arterial stenosis identified. CT perfusion head: The perfusion software identifies no core infarct. The perfusion software identifies no critically hypoperfused parenchyma (utilizing the Tmax>6 seconds threshold). No mismatch volume reported. Electronically Signed   By: Rockey Childs D.O.   On: 12/11/2023 17:59   CT HEAD CODE STROKE WO CONTRAST Result Date: 12/11/2023 CLINICAL DATA:  Code stroke. Neuro deficit, acute, stroke suspected. EXAM: CT HEAD WITHOUT CONTRAST TECHNIQUE: Contiguous axial images were obtained  from the base of the skull through the vertex without intravenous contrast. RADIATION DOSE REDUCTION: This exam was performed according to the departmental dose-optimization program which includes automated exposure control, adjustment of the mA and/or kV according to patient size and/or use of iterative reconstruction technique. COMPARISON:  None. FINDINGS: Brain: Mild generalized cerebral atrophy. There is no acute intracranial hemorrhage. No demarcated cortical infarct. No extra-axial fluid collection. No evidence of an intracranial mass. No midline shift. Vascular: No hyperdense vessel.  Atherosclerotic calcifications. Skull: No calvarial fracture or aggressive osseous lesion. Sinuses/Orbits: No mass or acute finding within the imaged orbits. No significant paranasal sinus disease at the imaged levels. ASPECTS [facility] Stroke Program Early CT Score) - Ganglionic  level infarction (caudate, lentiform nuclei, internal capsule, insula, M1-M3 cortex): 7 - Supraganglionic infarction (M4-M6 cortex): 3 Total score (0-10 with 10 being normal): 10 No evidence of an acute intracranial abnormality. These results were communicated to Dr. Jerri At 5:39 pmon 7/20/2025by text page via the [facility] messaging system. IMPRESSION: 1. No evidence of an acute intracranial abnormality. 2. Mild generalized cerebral atrophy. Electronically Signed   By: Rockey Childs D.O.   On: 12/11/2023 17:40     Procedures   Medications Ordered in the ED  sodium chloride  flush (NS) 0.9 % injection 3 mL (3 mLs Intravenous Not Given 12/11/23 1759)  thiamine  (VITAMIN B1) injection 100 mg (100 mg Intravenous Given 12/11/23 1944)  iohexol  (OMNIPAQUE ) 350 MG/ML injection 100 mL (100 mLs Intravenous Contrast Given 12/11/23 1744)  sodium chloride  0.9 % bolus 500 mL (0 mLs Intravenous Stopped 12/11/23 2004)  ondansetron  (ZOFRAN ) injection 4 mg (4 mg Intravenous Given 12/11/23 1944)                                    Medical Decision Making Amount and/or Complexity of Data Reviewed Labs: ordered. Radiology: ordered.  Risk Prescription drug management. Decision regarding hospitalization.    Katoria Yetman is a 63 y.o. female with a past medical history significant for asthma, fibromyalgia, GERD, rheumatoid arthritis, documented seizures, anxiety, hyperlipidemia, and varicose veins who presents as a code stroke for altered mental status and speech change.  According to EMS report, she was last normal at 11 AM this morning.  She then was found at 1 PM by family laying in her home without much response.  She is following things with her eyes but is not talking.  She is not following commands well.  She will not answer yes or no to anything.  Glucose was found to be in the 90s and vital signs are reassuring and route by report.  There is no reported trauma or overdose.  No other preceding symptoms and per  family report from EMS, she was completely at her baseline at 67 AM making lunch and talking on the phone with family.  On my exam while patient was in the CT scanner, lungs were clear.  Chest and abdomen did not appear tender.  Patient was not moving extremities for me.  She was not talking formally.  She did move her eyes around and pupils were symmetric and reactive.  Neurology feels she will likely need admission for altered mental status but is unclear if this is stroke or seizure or some other nonepileptiform etiology.  She will get CT and CTA and they will likely order MRI however due to her altered mental status, neurology feels she will likely need admission  at this time.  Anticipate reassessment after workup is further.  Neurology recommended admission for further management      Final diagnoses:  Altered mental status, unspecified altered mental status type  Transient speech disturbance    Clinical Impression: 1. Altered mental status, unspecified altered mental status type   2. Transient speech disturbance     Disposition: Admit  This note was prepared with assistance of Dragon voice recognition software. Occasional wrong-word or sound-a-like substitutions may have occurred due to the inherent limitations of voice recognition software.        Ronold Hardgrove, [name] [name], MD 12/11/23 2011

## 2023-12-11 NOTE — Assessment & Plan Note (Signed)
 In the setting of decreased p.o. intake Likely secondary to dehydration Chest x-ray nonacute Obtain urine electrolytes and rehydrate Foley fluid status follow sodium

## 2023-12-11 NOTE — Assessment & Plan Note (Signed)
 History of severe anxiety patient reports she takes Xanax  1 mg 4 times a day Patient tangential She would benefit from psychiatry evaluation and management

## 2023-12-11 NOTE — Assessment & Plan Note (Signed)
 Check lipid panel

## 2023-12-11 NOTE — Assessment & Plan Note (Signed)
 May be contributing to some of his symptoms will need to continue follow-up as an outpatient

## 2023-12-11 NOTE — Assessment & Plan Note (Signed)
-   Followed outpatient by GI

## 2023-12-11 NOTE — Assessment & Plan Note (Signed)
 Patient states cannot tolerate PPI ordered Mylanta

## 2023-12-11 NOTE — Assessment & Plan Note (Signed)
 Currently stable

## 2023-12-11 NOTE — Subjective & Objective (Signed)
 Last seen normal at 11 AM at 1 pm minimally responsive hx of prior seizures CT CTA non acute  MRI and EEG ordered Has been very tired Na 126 Endorses cough

## 2023-12-11 NOTE — ED Triage Notes (Addendum)
 Pt from home via [facility] as code stroke. Pt LKW at 1100 when her daughter spoke with her. At 1300 her daughter found her lysing on the couch with her eyes open and not responding. Ems CBG 97.

## 2023-12-11 NOTE — Assessment & Plan Note (Signed)
 Patient states chronic and recurrent ever since her hysterectomy.  Given significant abdominal obtain will obtain CT abdomen pelvis

## 2023-12-11 NOTE — ED Notes (Signed)
 Called 2W floor and let them know the PT will be coming up shortly, MRI stated they would go get her from her room on the floor.

## 2023-12-11 NOTE — H&P (Signed)
 Donna Francis FMW:[id number] DOB: 1960/09/26 DOA: 12/11/2023     PCP: Erick Greig LABOR, NP    GI Dr.Gupta  ( [facility])    Patient arrived to ER on 12/11/23 at 1716 Referred by Attending Tegeler, [name] [name], *   Patient coming from:    home Lives alone,      Chief Complaint:  unresponsive   HPI: Donna Francis is a 63 y.o. female with medical history significant of panic attacks  Paroxysmal tachycardia, anxiety, RA  Presented with episode of unresponsiveness Last seen normal at 11 AM at 1 pm minimally responsive hx of prior seizures CT CTA non acute  MRI and EEG ordered Has been very tired Na 126 Endorses cough   She reports multiple symptoms including poor appetite, sleep paralysis, trouble defecating, sleep deprivation, geumaphobia,  Reports she is getting weaker in general bc of decreased energy     Denies significant ETOH intake   Does not smoke   Denies marijuana use      Regarding pertinent Chronic problems:    Hyperlipidemia - not on statins   Chronic anemia - baseline hg Hemoglobin & Hematocrit  Recent Labs    12/06/23 0250 12/11/23 1721 12/11/23 1723  HGB 12.7 12.7 13.6     Seizure DO -pseudoseizures      While in ER:   Patient in the emergency department recovered and was able to be very verbal Patient reports multiple complaints that going back years somewhat tangential has history States she felt like she was going to have a stroke Has known history of panic attacks and pseudoseizures Neurology saw patient in the ER recommended MRI and EEG     Lab Orders         Resp panel by RT-PCR (RSV, Flu A&B, Covid) Anterior Nasal Swab         Protime-INR         APTT         CBC         Differential         Comprehensive metabolic panel         Ethanol         TSH         I-stat chem 8, ED      CT HEAD   NON acute CTA head and neck no LVO MRI brain   ordered  CXR -  NON acute    Following Medications were ordered in  ER: Medications  sodium chloride  flush (NS) 0.9 % injection 3 mL (3 mLs Intravenous Not Given 12/11/23 1759)  iohexol  (OMNIPAQUE ) 350 MG/ML injection 100 mL (100 mLs Intravenous Contrast Given 12/11/23 1744)  sodium chloride  0.9 % bolus 500 mL (500 mLs Intravenous New Bag/Given 12/11/23 1848)    _______________________________________________________ ER Provider Called:      NEurology  Dr. Jerri They Recommend admit to medicine    SEEN in ER     ED Triage Vitals  Encounter Vitals Group     BP 12/11/23 1747 (!) 153/80     Girls Systolic BP Percentile --      Girls Diastolic BP Percentile --      Boys Systolic BP Percentile --      Boys Diastolic BP Percentile --      Pulse Rate 12/11/23 1747 (!) 118     Resp 12/11/23 1747 17     Temp 12/11/23 1747 97.9 F (36.6 C)     Temp Source 12/11/23 1747 Oral  SpO2 12/11/23 1747 100 %     Weight 12/11/23 1723 81 lb 9.1 oz (37 kg)     Height --      Head Circumference --      Peak Flow --      Pain Score 12/11/23 1750 8     Pain Loc --      Pain Education --      Exclude from Growth Chart --   UFJK(75)@     _________________________________________ Significant initial  Findings: Abnormal Labs Reviewed  CBC - Abnormal; Notable for the following components:      Result Value   RDW 11.3 (*)    All other components within normal limits  COMPREHENSIVE METABOLIC PANEL WITH GFR - Abnormal; Notable for the following components:   Sodium 126 (*)    Chloride 90 (*)    BUN 5 (*)    All other components within normal limits  I-STAT CHEM 8, ED - Abnormal; Notable for the following components:   Sodium 126 (*)    Chloride 91 (*)    BUN 3 (*)    Calcium, Ion 1.09 (*)    All other components within normal limits        ECG: Ordered Personally reviewed and interpreted by me showing: HR : 114 Rhythm:Sinus tachycardia Biatrial enlargement Consider RVH w/ secondary repol abnormality when compared to prior, faster rate No sTEMI QTC 449    The recent clinical data is shown below. Vitals:   12/11/23 1723 12/11/23 1747  BP:  (!) 153/80  Pulse:  (!) 118  Resp:  17  Temp:  97.9 F (36.6 C)  TempSrc:  Oral  SpO2:  100%  Weight: 37 kg     WBC     Component Value Date/Time   WBC 5.9 12/11/2023 1721   LYMPHSABS 1.4 12/11/2023 1721   MONOABS 0.8 12/11/2023 1721   EOSABS 0.0 12/11/2023 1721   BASOSABS 0.0 12/11/2023 1721      UA   ordered    Results for orders placed or performed during the hospital encounter of 12/11/23  Resp panel by RT-PCR (RSV, Flu A&B, Covid) Anterior Nasal Swab     Status: None   Collection Time: 12/11/23  6:39 PM   Specimen: Anterior Nasal Swab  Result Value Ref Range Status   SARS Coronavirus 2 by RT PCR NEGATIVE NEGATIVE Final   Influenza A by PCR NEGATIVE NEGATIVE Final   Influenza B by PCR NEGATIVE NEGATIVE Final         Resp Syncytial Virus by PCR NEGATIVE NEGATIVE Final            __________________________________________________________ Recent Labs  Lab 12/06/23 0250 12/11/23 1721 12/11/23 1723  NA 128* 126* 126*  K 3.7 3.7 3.6  CO2 24 24  --   GLUCOSE 99 99 97  BUN 5* 5* 3*  CREATININE 0.61 0.54 0.70  CALCIUM 9.2 9.3  --     Cr  stable,  Lab Results  Component Value Date   CREATININE 0.70 12/11/2023   CREATININE 0.54 12/11/2023   CREATININE 0.61 12/06/2023    Recent Labs  Lab 12/06/23 0250 12/11/23 1721  AST 26 25  ALT 23 23  ALKPHOS 69 61  BILITOT 0.7 0.6  PROT 7.8 7.3  ALBUMIN 3.8 3.8   Lab Results  Component Value Date   CALCIUM 9.3 12/11/2023    Plt: Lab Results  Component Value Date   PLT 291 12/11/2023  Recent Labs  Lab 12/06/23 0250 12/11/23 1721 12/11/23 1723  WBC 4.6 5.9  --   NEUTROABS 2.2 3.6  --   HGB 12.7 12.7 13.6  HCT 38.5 37.9 40.0  MCV 91.4 90.2  --   PLT 281 291  --     HG/HCT   stable,       Component Value Date/Time   HGB 13.6 12/11/2023 1723   HCT 40.0 12/11/2023 1723   MCV 90.2 12/11/2023 1721      Recent Labs  Lab 12/06/23 0250  LIPASE 64*   No results for input(s): AMMONIA in the last 168 hours.    _______________________________________________ Hospitalist was called for admission for unresponsive episode hyponatremia   The following Work up has been ordered so far:  Orders Placed This Encounter  Procedures   Resp panel by RT-PCR (RSV, Flu A&B, Covid) Anterior Nasal Swab   CT HEAD CODE STROKE WO CONTRAST   CT ANGIO HEAD NECK W WO CM W PERF (CODE STROKE)   DG Chest Portable 1 View   Protime-INR   APTT   CBC   Differential   Comprehensive metabolic panel   Ethanol   TSH   Diet NPO time specified   ED Cardiac monitoring   NIH Stroke Scale   Saline Lock IV, Maintain IV access   If O2 sat <94% Administer O2 @ 2 Liters/Minute   Consult for [facility] Admission   ED Pulse oximetry, continuous   I-stat chem 8, ED   EKG 12-Lead   EEG adult     OTHER Significant initial  Findings:  labs showing:     DM  labs:  HbA1C: No results for input(s): HGBA1C in the last 8760 hours.     CBG (last 3)  No results for input(s): GLUCAP in the last 72 hours.        Cultures:    Component Value Date/Time   SDES  03/30/2019 1541    URINE, RANDOM Performed at [facility], [address] [place], [place] [address]    [facility]  03/30/2019 1541    NONE Performed at [facility], [address] [place], [place] [address]    CULT MULTIPLE SPECIES PRESENT, SUGGEST RECOLLECTION (A) 03/30/2019 1541   REPTSTATUS 04/01/2019 FINAL 03/30/2019 1541     Radiological Exams on Admission: CT ANGIO HEAD NECK W WO CM W PERF (CODE STROKE) Result Date: 12/11/2023 CLINICAL DATA:  Provided history: Neuro deficit, acute, stroke suspected. EXAM: CT ANGIOGRAPHY HEAD AND NECK CT PERFUSION BRAIN TECHNIQUE: Multidetector CT imaging of the head and neck was performed using the standard protocol during bolus administration of intravenous contrast.  Multiplanar CT image reconstructions and MIPs were obtained to evaluate the vascular anatomy. Carotid stenosis measurements (when applicable) are obtained utilizing NASCET criteria, using the distal internal carotid diameter as the denominator. Multiphase CT imaging of the brain was performed following IV bolus contrast injection. Subsequent parametric perfusion maps were calculated using RAPID software. RADIATION DOSE REDUCTION: This exam was performed according to the departmental dose-optimization program which includes automated exposure control, adjustment of the mA and/or kV according to patient size and/or use of iterative reconstruction technique. CONTRAST:  Administered contrast not known at this time. COMPARISON:  Non-contrast head CT performed earlier today 12/11/2023. FINDINGS: CTA NECK FINDINGS Aortic arch: Common origin of the innominate and left common carotid arteries. Mild despite plaque within the proximal right subclavian artery. No hemodynamically significant innominate or proximal subclavian artery stenosis.  Right carotid system: CCA and ICA patent within the neck without stenosis or significant atherosclerotic disease. Left carotid system: CCA and ICA patent within the neck without stenosis or significant atherosclerotic disease. Vertebral arteries: Codominant and patent within the neck without stenosis or significant atherosclerotic disease. Skeleton: Nonspecific reversal of the expected cervical lordosis. Cervical spondylosis. No acute fracture or aggressive osseous lesion. Other neck: No neck mass or cervical lymphadenopathy Upper chest: No consolidation within the imaged lung apices. Biapical pleuroparenchymal scarring. Review of the MIP images confirms the above findings CTA HEAD FINDINGS Anterior circulation: The intracranial internal carotid arteries are patent. Atherosclerotic plaque within both vessels. Most notably, there is up to moderate stenosis of the cavernous right ICA. The M1  middle cerebral arteries are patent. No M2 proximal branch occlusion or high-grade proximal stenosis. The anterior cerebral arteries are patent. Hypoplastic right A1 segment. No intracranial aneurysm is identified. Posterior circulation: The intracranial vertebral arteries are patent. The basilar artery is patent. The posterior cerebral arteries are patent. Posterior communicating arteries are present bilaterally. Venous sinuses: Within the limitations of contrast timing, no convincing thrombus. Anatomic variants: As described. Review of the MIP images confirms the above findings CT Brain Perfusion Findings: CBF (<30%) Volume: 0mL Perfusion (Tmax>6.0s) volume: 0mL Mismatch Volume: 0mL Infarction Location: None identified. IMPRESSION: CTA neck: The common carotid, internal carotid and vertebral arteries are patent within the neck without stenosis or significant atherosclerotic disease. CTA head: No proximal intracranial large vessel occlusion or high-grade proximal arterial stenosis identified. CT perfusion head: The perfusion software identifies no core infarct. The perfusion software identifies no critically hypoperfused parenchyma (utilizing the Tmax>6 seconds threshold). No mismatch volume reported. Electronically Signed   By: Rockey Childs D.O.   On: 12/11/2023 17:59   CT HEAD CODE STROKE WO CONTRAST Result Date: 12/11/2023 CLINICAL DATA:  Code stroke. Neuro deficit, acute, stroke suspected. EXAM: CT HEAD WITHOUT CONTRAST TECHNIQUE: Contiguous axial images were obtained from the base of the skull through the vertex without intravenous contrast. RADIATION DOSE REDUCTION: This exam was performed according to the departmental dose-optimization program which includes automated exposure control, adjustment of the mA and/or kV according to patient size and/or use of iterative reconstruction technique. COMPARISON:  None. FINDINGS: Brain: Mild generalized cerebral atrophy. There is no acute intracranial hemorrhage. No  demarcated cortical infarct. No extra-axial fluid collection. No evidence of an intracranial mass. No midline shift. Vascular: No hyperdense vessel.  Atherosclerotic calcifications. Skull: No calvarial fracture or aggressive osseous lesion. Sinuses/Orbits: No mass or acute finding within the imaged orbits. No significant paranasal sinus disease at the imaged levels. ASPECTS [facility] Stroke Program Early CT Score) - Ganglionic level infarction (caudate, lentiform nuclei, internal capsule, insula, M1-M3 cortex): 7 - Supraganglionic infarction (M4-M6 cortex): 3 Total score (0-10 with 10 being normal): 10 No evidence of an acute intracranial abnormality. These results were communicated to Dr. Jerri At 5:39 pmon 7/20/2025by text page via the [facility] messaging system. IMPRESSION: 1. No evidence of an acute intracranial abnormality. 2. Mild generalized cerebral atrophy. Electronically Signed   By: Rockey Childs D.O.   On: 12/11/2023 17:40   _______________________________________________________________________________________________________ Latest  Blood pressure (!) 153/80, pulse (!) 118, temperature 97.9 F (36.6 C), temperature source Oral, resp. rate 17, weight 37 kg, SpO2 100%.   Vitals  labs and radiology finding personally reviewed  Review of Systems:    Pertinent positives include:    fatigue, weight loss  Constitutional:  No weight loss, night sweats, Fevers, chills, HEENT:  No headaches, Difficulty  swallowing,Tooth/dental problems,Sore throat,  No sneezing, itching, ear ache, nasal congestion, post nasal drip,  Cardio-vascular:  No chest pain, Orthopnea, PND, anasarca, dizziness, palpitations.no Bilateral lower extremity swelling  GI:  No heartburn, indigestion, abdominal pain, nausea, vomiting, diarrhea, change in bowel habits, loss of appetite, melena, blood in stool, hematemesis Resp:  no shortness of breath at rest. No dyspnea on exertion, No excess mucus, no productive cough, No  non-productive cough, No coughing up of blood.No change in color of mucus.No wheezing. Skin:  no rash or lesions. No jaundice GU:  no dysuria, change in color of urine, no urgency or frequency. No straining to urinate.  No flank pain.  Musculoskeletal:  No joint pain or no joint swelling. No decreased range of motion. No back pain.  Psych:  No change in mood or affect. No depression or anxiety. No memory loss.  Neuro: no localizing neurological complaints, no tingling, no weakness, no double vision, no gait abnormality, no slurred speech, no confusion  All systems reviewed and apart from HOPI all are negative _______________________________________________________________________________________________ Past Medical History:   Past Medical History:  Diagnosis Date   Allergy    Anemia    Anxiety    Asthma    pt states diagnosed by Pulmonologist   Colitis    Fibromyalgia    GERD (gastroesophageal reflux disease)    pt reports buringing in abdomen that has recently started moving up higher into her chest   Heart rate fast    IBS (irritable bowel syndrome)    Interstitial cystitis    Osteopenia after menopause    Panic attacks    Rapid heartbeat    Rheumatoid arthritis (HCC)    Seizures (HCC)    UTI (urinary tract infection)    Varicose veins of bilateral lower extremities with pain       Past Surgical History:  Procedure Laterality Date   CESAREAN SECTION     x 4   COLONOSCOPY  1995   in [place]    ESOPHAGOGASTRODUODENOSCOPY  03/07/2020   [facility] [address]. Normal upper endoscopy   LAPAROSCOPIC HYSTERECTOMY  04/26/2016   TUBAL LIGATION      Social History:  Ambulatory   independently       reports that she has never smoked. She has never used smokeless tobacco. She reports that she does not currently use alcohol . She reports that she does not use drugs.    Family History:   Family History  Problem Relation Age of Onset   Pancreatic cancer  Mother    Diabetes Mother    Colon cancer Neg Hx    Esophageal cancer Neg Hx    Rectal cancer Neg Hx    Stomach cancer Neg Hx    ______________________________________________________________________________________________ Allergies: Allergies  Allergen Reactions   Fentanyl    Haldol  [Haloperidol ]    Kenalog [Triamcinolone ] Other (See Comments)    Stomach cramps   Propofol Other (See Comments)    Hallucinations and anxiety. Only low dose Hallucinations and anxiety Hallucinations and anxiety. Only low dose   Proton Pump Inhibitors Other (See Comments)    Pt reports multiple side effects Pt reports multiple side effects Pt reports multiple side effects     Prior to Admission medications   Medication Sig Start Date End Date Taking? Authorizing Provider  acetaminophen  (TYLENOL ) 500 MG tablet Take 500 mg by mouth 3 (three) times daily. Patient not taking: Reported on 11/29/2022    [provider]  ALPRAZolam  (XANAX ) 1 MG tablet Take  1 mg by mouth 4 (four) times daily.    [provider]  Alum & Mag Hydroxide-Simeth (MYLANTA PO) Take 1 tablet by mouth as needed.    [provider]  dicyclomine  (BENTYL ) 10 MG capsule Take 1 capsule (10 mg total) by mouth 3 (three) times daily as needed for spasms. 06/18/22   Charlanne Groom, MD  dicyclomine  (BENTYL ) 10 MG capsule Take 1 capsule (10 mg total) by mouth 2 (two) times daily as needed for spasms. 05/11/23   Charlanne Groom, MD  dicyclomine  (BENTYL ) 20 MG tablet Take 1 tablet (20 mg total) by mouth 2 (two) times daily as needed for spasms. Please break into 1/4 04/01/23   Charlanne Groom, MD  lidocaine  (XYLOCAINE ) 2 % solution Use as directed 15 mLs in the mouth or throat as needed for mouth pain.    [provider]  loratadine  (CLARITIN ) 5 MG chewable tablet Chew 10 mg by mouth daily.    [provider]  montelukast  (SINGULAIR ) 5 MG chewable tablet Chew 5 mg by mouth daily. 11/13/20   [provider]  ondansetron  (ZOFRAN -ODT) 4 MG disintegrating tablet DISSOLVE 1 TABLET IN MOUTH EVERY 6 HOURS AS NEEDED FOR NAUSEA FOR VOMITING 08/07/21   Charlanne Groom, MD  ondansetron  (ZOFRAN -ODT) 4 MG disintegrating tablet Take 1 tablet (4 mg total) by mouth every 8 (eight) hours as needed. 11/17/23   Dean Clarity, MD  pantoprazole  (PROTONIX ) 20 MG tablet Take 1 tablet (20 mg total) by mouth daily. 05/11/23   Charlanne Groom, MD  polyethylene glycol powder (GLYCOLAX /MIRALAX ) 17 GM/SCOOP powder Take 17 g by mouth daily. 08/31/23   Charlanne Groom, MD    ___________________________________________________________________________________________________ Physical Exam:    12/11/2023    5:47 PM 12/11/2023    5:23 PM 12/06/2023    7:00 AM  Vitals with BMI  Weight  81 lbs 9 oz   BMI  18.3   Systolic 153  136  Diastolic 80  83  Pulse 118  98     1. General:  in No  Acute distress    Chronically ill   -appearing 2. Psychological: Alert and   Oriented 3. Head/ENT:    Dry Mucous Membranes                          Head Non traumatic, neck supple                         Poor Dentition 4. SKIN: decreased Skin turgor,  Skin clean Dry and intact no rash    5. Heart: Regular rate and rhythm no  Murmur, no Rub or gallop 6. Lungs:  no wheezes or crackles   7. Abdomen: Soft,  diffusely-tender, Non distended thin  bowel sounds present 8. Lower extremities: no clubbing, cyanosis, no  edema 9. Neurologically strength diminished throughout 10. MSK: Normal range of motion    Chart has been reviewed  ______________________________________________________________________________________________  Assessment/Plan 63 y.o. female with medical history significant of panic attacks  Paroxysmal tachycardia, anxiety, RA  Admitted for unresponsive episode  Present on Admission:  Unresponsive episode  Abdominal pain  Anemia, unspecified  Anxiety  GERD (gastroesophageal reflux disease)  IBS (irritable bowel syndrome)   Mixed hyperlipidemia  Rheumatoid arthritis (HCC)  Weight loss  Hyponatremia  Abnormal EKG     Abdominal pain Patient states chronic and recurrent ever since her hysterectomy.  Given significant abdominal obtain will obtain CT abdomen pelvis  Anemia, unspecified Currently stable  Anxiety History of severe anxiety patient reports she takes Xanax  1 mg 4 times a day Patient tangential She would benefit from psychiatry evaluation and management  GERD (gastroesophageal reflux disease) Patient states cannot tolerate PPI ordered Mylanta  IBS (irritable bowel syndrome) Followed outpatient by GI  Mixed hyperlipidemia Check lipid panel  Rheumatoid arthritis (HCC) May be contributing to some of his symptoms will need to continue follow-up as an outpatient  Weight loss Patient decreased p.o. intake would benefit from nutritional consult check electrolytes magnesium and phosphate check thiamine  level check vitamin levels Severe anxiety may be interfering with eating appreciate behavioral health consult  Unresponsive episode Unclear episode.  Patient states that she felt like she was getting a stroke. Apparently was poorly responsive no localized neurological deficits but overall generally weak MRI pending EEG ordered Patient has history of pseudoseizures This could be also a catatonia like state in the setting of significant anxiety/depression Appreciate behavioral health consult as well  Hyponatremia In the setting of decreased p.o. intake Likely secondary to dehydration Chest x-ray nonacute Obtain urine electrolytes and rehydrate Foley fluid status follow sodium  Abnormal EKG Obtain echogram monitor on telemetry    Other plan as per orders.  DVT prophylaxis:  SCD      Code Status:    Code Status: Prior FULL CODE  as per patient   I had personally discussed CODE STATUS with patient   ACP   none    Family Communication:   Family not at  Bedside    Diet  Diet  Orders (From admission, onward)     Start     Ordered   12/11/23 2148  Diet NPO time specified Except for: Sips with Meds, Ice Chips  Diet effective now       Question Answer Comment  Except for Sips with Meds   Except for Ice Chips      12/11/23 2147            Disposition Plan:     To home once workup is complete and patient is stable   Following barriers for discharge:                             Stroke/Syncope /unresponsive episode work up is complete                            Electrolytes corrected                               Anemia  stable                             Pain controlled with PO medications                                                            Will need to be able to tolerate PO  Will need consultants to evaluate patient prior to discharge                                    Would benefit from PT/OT eval prior to DC  Ordered                                     Behavioral health  consulted                    Consults called: Neurology   Admission status:  ED Disposition     ED Disposition  Admit   Condition  --   Comment  Hospital Area: [facility] [[id number]]  Level of Care: Telemetry Medical [104]  I expect the patient will be discharged within 24 hours: No (not a candidate for [facility]-2W observation unit)  May place patient in observation at [facility] or [facility] if equivalent level of care is available:: No  Covid Evaluation: Asymptomatic - no recent exposure (last 10 days) testing not required  Diagnosis: Unresponsive episode [[id number]]  Admitting Physician: Dashauna Heymann [[id number]]  Attending Physician: Calob Baskette [[id number]]  For patients discharging to extended facilities (i.e. SNF, AL, group homes or LTAC) initiate:: Discharge to SNF/Facility Placement COVID-19 Lab Testing Protocol           Obs       Level of care     tele  indefinitely please  discontinue once patient no longer qualifies COVID-19 Labs      Demitri Kucinski 12/11/2023, 10:01 PM    [facility]     after 2 AM please page floor coverage   If 7AM-7PM, please contact the day team taking care of the patient using [email or web address]

## 2023-12-11 NOTE — Assessment & Plan Note (Addendum)
 Patient decreased p.o. intake would benefit from nutritional consult check electrolytes magnesium and phosphate check thiamine  level check vitamin levels Severe anxiety may be interfering with eating appreciate behavioral health consult

## 2023-12-11 NOTE — ED Notes (Signed)
 Health Proxy Lyle [phone] would like an update asap

## 2023-12-11 NOTE — Consult Note (Signed)
 Stroke Neurology Consultation Note  Consult Requested by: Dr. Ginger   Reason for Consult: code stroke  Consult Date: 12/11/23   The history was obtained from the EMS.  During history and examination, all items were not able to obtain unless otherwise noted.  History of Present Illness:  Donna Francis is a 62 y.o. African American female with PMH of severe anxiety with panic attacks, fibromyalgia, IBS, pseudoseizure, tension headache presented to ED for code stroke.  Per EMS, daughter reported patient at baseline when daughter called patient around 19 AM, at that time patient was up, cooking in the kitchen and speaking normally.  However, around 1 PM, when daughter went to see her at home, she was sitting in the couch, lethargic, staring off, not answer questions, not talking, some concern of left-sided weakness.  Code stroke activated.  BP 176/72, glucose of 97.  Home medication including oxycodone and Xanax .  At ER arrival, patient nonverbal, able to track bilaterally, inconsistently blinking to visual threat bilaterally, moving bilateral extremities seems symmetrical.  CT no acute abnormality.  CTA head and neck no LVO, CTP negative.  Of note, in 09/2017 patient admitted for seizure versus pseudoseizure in the setting of significant stress at home.  Patient improved significantly, neurologically normal, no EEG pursued, concerning for a severe response to anxiety.  She also followed with Dr. Skeet as outpatient for tension headache, anxiety.  Home medication including Xanax  1 mg 4 times a day.  LSN: 11 AM TNK Given: No: Outside window IR: No, no LVO mRS = 0  Past Medical History:  Diagnosis Date   Allergy    Anemia    Anxiety    Asthma    pt states diagnosed by Pulmonologist   Colitis    Fibromyalgia    GERD (gastroesophageal reflux disease)    pt reports buringing in abdomen that has recently started moving up higher into her chest   Heart rate fast    IBS (irritable bowel  syndrome)    Interstitial cystitis    Osteopenia after menopause    Panic attacks    Rapid heartbeat    Rheumatoid arthritis (HCC)    Seizures (HCC)    UTI (urinary tract infection)    Varicose veins of bilateral lower extremities with pain     Past Surgical History:  Procedure Laterality Date   CESAREAN SECTION     x 4   COLONOSCOPY  1995   in [place]    ESOPHAGOGASTRODUODENOSCOPY  03/07/2020   [facility] Dr Larene. Normal upper endoscopy   LAPAROSCOPIC HYSTERECTOMY  04/26/2016   TUBAL LIGATION      Family History  Problem Relation Age of Onset   Pancreatic cancer Mother    Diabetes Mother    Colon cancer Neg Hx    Esophageal cancer Neg Hx    Rectal cancer Neg Hx    Stomach cancer Neg Hx     Social History:  reports that she has never smoked. She has never used smokeless tobacco. She reports that she does not currently use alcohol . She reports that she does not use drugs.  Allergies:  Allergies  Allergen Reactions   Fentanyl    Haldol  [Haloperidol ]    Kenalog [Triamcinolone ] Other (See Comments)    Stomach cramps   Propofol Other (See Comments)    Hallucinations and anxiety. Only low dose Hallucinations and anxiety Hallucinations and anxiety. Only low dose   Proton Pump Inhibitors Other (See Comments)    Pt reports multiple  side effects Pt reports multiple side effects Pt reports multiple side effects    No current facility-administered medications on file prior to encounter.   Current Outpatient Medications on File Prior to Encounter  Medication Sig Dispense Refill   acetaminophen  (TYLENOL ) 500 MG tablet Take 500 mg by mouth 3 (three) times daily. (Patient not taking: Reported on 11/29/2022)     ALPRAZolam  (XANAX ) 1 MG tablet Take 1 mg by mouth 4 (four) times daily.     Alum & Mag Hydroxide-Simeth (MYLANTA PO) Take 1 tablet by mouth as needed.     dicyclomine  (BENTYL ) 10 MG capsule Take 1 capsule (10 mg total) by mouth 3 (three) times daily as  needed for spasms. 90 capsule 2   dicyclomine  (BENTYL ) 10 MG capsule Take 1 capsule (10 mg total) by mouth 2 (two) times daily as needed for spasms. 30 capsule 2   dicyclomine  (BENTYL ) 20 MG tablet Take 1 tablet (20 mg total) by mouth 2 (two) times daily as needed for spasms. Please break into 1/4 60 tablet 2   lidocaine  (XYLOCAINE ) 2 % solution Use as directed 15 mLs in the mouth or throat as needed for mouth pain.     loratadine  (CLARITIN ) 5 MG chewable tablet Chew 10 mg by mouth daily.     montelukast  (SINGULAIR ) 5 MG chewable tablet Chew 5 mg by mouth daily.     ondansetron  (ZOFRAN -ODT) 4 MG disintegrating tablet DISSOLVE 1 TABLET IN MOUTH EVERY 6 HOURS AS NEEDED FOR NAUSEA FOR VOMITING 20 tablet 2   ondansetron  (ZOFRAN -ODT) 4 MG disintegrating tablet Take 1 tablet (4 mg total) by mouth every 8 (eight) hours as needed. 20 tablet 0   pantoprazole  (PROTONIX ) 20 MG tablet Take 1 tablet (20 mg total) by mouth daily. 30 tablet 0   polyethylene glycol powder (GLYCOLAX /MIRALAX ) 17 GM/SCOOP powder Take 17 g by mouth daily. 255 g 2    Review of Systems: A full ROS was attempted today and was not able to be performed due to altered mental status.  Physical Examination: Temp:  [97.9 F (36.6 C)] 97.9 F (36.6 C) (07/20 1747) Pulse Rate:  [118] 118 (07/20 1747) Resp:  [17] 17 (07/20 1747) BP: (153)/(80) 153/80 (07/20 1747) SpO2:  [100 %] 100 % (07/20 1747) Weight:  [37 kg] 37 kg (07/20 1723)  General - malnourished, well developed, mildly lethargic.    Ophthalmologic - fundi not visualized due to noncooperation.    Cardiovascular - regular rhythm and rate  Neuro - awake, eyes open, nonverbal, not answer questions, not following all simple commands. No gaze palsy, tracking bilaterally, inconsistently blinking to visual threat bilaterally, PERRL. No facial droop. Tongue protrusion not corporative. Bilateral UEs 3/5, slow drift to bed within 10 seconds. Bilaterally LEs able to hold knee flexion  and foot on bed position without significant drift. Sensation, coordination and gait not tested.  NIH Stroke Scale  Level Of Consciousness 0=Alert; keenly responsive 1=Arouse to minor stimulation 2=Requires repeated stimulation to arouse or movements to pain 3=postures or unresponsive 0  LOC Questions to Month and Age 67=Answers both questions correctly 1=Answers one question correctly or dysarthria/intubated/trauma/language barrier 2=Answers neither question correctly or aphasia 2  LOC Commands      -Open/Close eyes     -Open/close grip     -Pantomime commands if communication barrier 0=Performs both tasks correctly 1=Performs one task correctly 2=Performs neighter task correctly 2  Best Gaze     -Only assess horizontal gaze 0=Normal 1=Partial gaze palsy 2=Forced deviation, or  total gaze paresis 0  Visual 0=No visual loss 1=Partial hemianopia 2=Complete hemianopia 3=Bilateral hemianopia (blind including cortical blindness) 3  Facial Palsy     -Use grimace if obtunded 0=Normal symmetrical movement 1=Minor paralysis (asymmetry) 2=Partial paralysis (lower face) 3=Complete paralysis (upper and lower face) 0  Motor  0=No drift for 10/5 seconds 1=Drift, but does not hit bed 2=Some antigravity effort, hits  bed 3=No effort against gravity, limb falls 4=No movement 0=Amputation/joint fusion Right Arm 2     Leg 3    Left Arm 2     Leg 3  Limb Ataxia     - FNT/HTS 0=Absent or does not understand or paralyzed or amputation/joint fusion 1=Present in one limb 2=Present in two limbs 0  Sensory 0=Normal 1=Mild to moderate sensory loss 2=Severe to total sensory loss or coma/unresponsive 1  Best Language 0=No aphasia, normal 1=Mild to moderate aphasia 2=Severe aphasia 3=Mute, global aphasia, or coma/unresponsive 3  Dysarthria 0=Normal 1=Mild to moderate 2=Severe, unintelligible or mute/anarthric 0=intubated/unable to test 2  Extinction/Neglect 0=No  abnormality 1=visual/tactile/auditory/spatia/personal inattention/Extinction to bilateral simultaneous stimulation 2=Profound neglect/extinction more than 1 modality  2  Total   25      Data Reviewed: CT ANGIO HEAD NECK W WO CM W PERF (CODE STROKE) Result Date: 12/11/2023 CLINICAL DATA:  Provided history: Neuro deficit, acute, stroke suspected. EXAM: CT ANGIOGRAPHY HEAD AND NECK CT PERFUSION BRAIN TECHNIQUE: Multidetector CT imaging of the head and neck was performed using the standard protocol during bolus administration of intravenous contrast. Multiplanar CT image reconstructions and MIPs were obtained to evaluate the vascular anatomy. Carotid stenosis measurements (when applicable) are obtained utilizing NASCET criteria, using the distal internal carotid diameter as the denominator. Multiphase CT imaging of the brain was performed following IV bolus contrast injection. Subsequent parametric perfusion maps were calculated using RAPID software. RADIATION DOSE REDUCTION: This exam was performed according to the departmental dose-optimization program which includes automated exposure control, adjustment of the mA and/or kV according to patient size and/or use of iterative reconstruction technique. CONTRAST:  Administered contrast not known at this time. COMPARISON:  Non-contrast head CT performed earlier today 12/11/2023. FINDINGS: CTA NECK FINDINGS Aortic arch: Common origin of the innominate and left common carotid arteries. Mild despite plaque within the proximal right subclavian artery. No hemodynamically significant innominate or proximal subclavian artery stenosis. Right carotid system: CCA and ICA patent within the neck without stenosis or significant atherosclerotic disease. Left carotid system: CCA and ICA patent within the neck without stenosis or significant atherosclerotic disease. Vertebral arteries: Codominant and patent within the neck without stenosis or significant atherosclerotic  disease. Skeleton: Nonspecific reversal of the expected cervical lordosis. Cervical spondylosis. No acute fracture or aggressive osseous lesion. Other neck: No neck mass or cervical lymphadenopathy Upper chest: No consolidation within the imaged lung apices. Biapical pleuroparenchymal scarring. Review of the MIP images confirms the above findings CTA HEAD FINDINGS Anterior circulation: The intracranial internal carotid arteries are patent. Atherosclerotic plaque within both vessels. Most notably, there is up to moderate stenosis of the cavernous right ICA. The M1 middle cerebral arteries are patent. No M2 proximal branch occlusion or high-grade proximal stenosis. The anterior cerebral arteries are patent. Hypoplastic right A1 segment. No intracranial aneurysm is identified. Posterior circulation: The intracranial vertebral arteries are patent. The basilar artery is patent. The posterior cerebral arteries are patent. Posterior communicating arteries are present bilaterally. Venous sinuses: Within the limitations of contrast timing, no convincing thrombus. Anatomic variants: As described. Review of the MIP images confirms  the above findings CT Brain Perfusion Findings: CBF (<30%) Volume: 0mL Perfusion (Tmax>6.0s) volume: 0mL Mismatch Volume: 0mL Infarction Location: None identified. IMPRESSION: CTA neck: The common carotid, internal carotid and vertebral arteries are patent within the neck without stenosis or significant atherosclerotic disease. CTA head: No proximal intracranial large vessel occlusion or high-grade proximal arterial stenosis identified. CT perfusion head: The perfusion software identifies no core infarct. The perfusion software identifies no critically hypoperfused parenchyma (utilizing the Tmax>6 seconds threshold). No mismatch volume reported. Electronically Signed   By: Rockey Childs D.O.   On: 12/11/2023 17:59   CT HEAD CODE STROKE WO CONTRAST Result Date: 12/11/2023 CLINICAL DATA:  Code  stroke. Neuro deficit, acute, stroke suspected. EXAM: CT HEAD WITHOUT CONTRAST TECHNIQUE: Contiguous axial images were obtained from the base of the skull through the vertex without intravenous contrast. RADIATION DOSE REDUCTION: This exam was performed according to the departmental dose-optimization program which includes automated exposure control, adjustment of the mA and/or kV according to patient size and/or use of iterative reconstruction technique. COMPARISON:  None. FINDINGS: Brain: Mild generalized cerebral atrophy. There is no acute intracranial hemorrhage. No demarcated cortical infarct. No extra-axial fluid collection. No evidence of an intracranial mass. No midline shift. Vascular: No hyperdense vessel.  Atherosclerotic calcifications. Skull: No calvarial fracture or aggressive osseous lesion. Sinuses/Orbits: No mass or acute finding within the imaged orbits. No significant paranasal sinus disease at the imaged levels. ASPECTS [facility] Stroke Program Early CT Score) - Ganglionic level infarction (caudate, lentiform nuclei, internal capsule, insula, M1-M3 cortex): 7 - Supraganglionic infarction (M4-M6 cortex): 3 Total score (0-10 with 10 being normal): 10 No evidence of an acute intracranial abnormality. These results were communicated to Dr. Jerri At 5:39 pmon 7/20/2025by text page via the [facility] messaging system. IMPRESSION: 1. No evidence of an acute intracranial abnormality. 2. Mild generalized cerebral atrophy. Electronically Signed   By: Rockey Childs D.O.   On: 12/11/2023 17:40   CT CHEST ABDOMEN PELVIS W CONTRAST Result Date: 11/17/2023 CLINICAL DATA:  Sepsis. Chest heaviness. Generalized abdominal pain. EXAM: CT CHEST, ABDOMEN, AND PELVIS WITH CONTRAST TECHNIQUE: Multidetector CT imaging of the chest, abdomen and pelvis was performed following the standard protocol during bolus administration of intravenous contrast. RADIATION DOSE REDUCTION: This exam was performed according to the departmental  dose-optimization program which includes automated exposure control, adjustment of the mA and/or kV according to patient size and/or use of iterative reconstruction technique. CONTRAST:  80mL OMNIPAQUE  IOHEXOL  300 MG/ML  SOLN COMPARISON:  CT scan abdomen and pelvis from 10/29/2021. FINDINGS: CT CHEST FINDINGS Cardiovascular: Normal cardiac size. No pericardial effusion. No aortic aneurysm. Mediastinum/Nodes: Visualized thyroid  gland appears grossly unremarkable. No solid / cystic mediastinal masses. The esophagus is nondistended precluding optimal assessment. No axillary, mediastinal or hilar lymphadenopathy by size criteria. Lungs/Pleura: The central tracheo-bronchial tree is patent. There are patchy areas of linear, plate-like atelectasis and/or scarring throughout bilateral lungs. There is biapical pleuroparenchymal disease, right more than left. No mass or consolidation. No pleural effusion or pneumothorax. No suspicious lung nodules. Musculoskeletal: The visualized soft tissues of the chest wall are grossly unremarkable. No suspicious osseous lesions. CT ABDOMEN PELVIS FINDINGS Hepatobiliary: The liver is normal in size. Non-cirrhotic configuration. No suspicious mass. These is mild diffuse hepatic steatosis. No intrahepatic or extrahepatic bile duct dilation. No calcified gallstones. Normal gallbladder wall thickness. No pericholecystic inflammatory changes. Pancreas: Unremarkable. No pancreatic ductal dilatation or surrounding inflammatory changes. Spleen: Within normal limits. No focal lesion. Adrenals/Urinary Tract: Adrenal glands are unremarkable. No suspicious  renal mass. There is a 8 x 9 mm simple cyst in the right kidney interpolar region, laterally. No nephroureterolithiasis or obstructive uropathy on either side. Unremarkable urinary bladder. Stomach/Bowel: No disproportionate dilation of the small or large bowel loops. No evidence of abnormal bowel wall thickening or inflammatory changes. The  appendix is unremarkable. Vascular/Lymphatic: No ascites or pneumoperitoneum. No abdominal or pelvic lymphadenopathy, by size criteria. No aneurysmal dilation of the major abdominal arteries. Reproductive: The uterus is surgically absent. No large adnexal mass. Other: The visualized soft tissues and abdominal wall are unremarkable. Musculoskeletal: No suspicious osseous lesions. IMPRESSION: 1. No acute inflammatory process identified within the chest, abdomen or pelvis. 2. No lung mass, consolidation, pleural effusion or pneumothorax. No bowel obstruction. 3. Multiple other nonacute observations, as described above. Electronically Signed   By: Ree Molt M.D.   On: 11/17/2023 17:07    Assessment: 63 y.o. female with PMH of severe anxiety with panic attacks, fibromyalgia, IBS, pseudoseizure, tension headache presented to ED for acute onset mental status change, staring off, not answer questions, not talking, but able to track bilaterally, inconsistently blinking to visual threat bilaterally, moving bilateral extremities seems symmetrical. LSW 11am. CT no acute abnormality.  CTA head and neck no LVO, CTP negative.  Patient not TNK candidate given outside window, not IR candidate given no LVO.  Etiology for patient's symptoms concerning for anxiety with panic attack, recommend MRI to rule out stroke.  EEG to rule out a seizure.   Plan: - MRI brain to rule out stroke - EEG stat rule out seizure - PT consult, OT consult, Speech consult - Close neuro monitoring - Bedside swallow screen - Risk factor modification - Telemetry monitoring - Discussed with EDP Dr. Ginger - will follow  Thank you for this consultation and allowing us  to participate in the care of this patient.  Ary Cummins, MD PhD Stroke Neurology 12/11/2023 7:56 PM

## 2023-12-11 NOTE — Assessment & Plan Note (Signed)
Obtain echogram monitor on telemetry °

## 2023-12-12 ENCOUNTER — Observation Stay (HOSPITAL_COMMUNITY)

## 2023-12-12 ENCOUNTER — Observation Stay (HOSPITAL_BASED_OUTPATIENT_CLINIC_OR_DEPARTMENT_OTHER)

## 2023-12-12 DIAGNOSIS — F410 Panic disorder [episodic paroxysmal anxiety] without agoraphobia: Secondary | ICD-10-CM | POA: Diagnosis not present

## 2023-12-12 DIAGNOSIS — Z1152 Encounter for screening for COVID-19: Secondary | ICD-10-CM | POA: Diagnosis not present

## 2023-12-12 DIAGNOSIS — G44209 Tension-type headache, unspecified, not intractable: Secondary | ICD-10-CM | POA: Diagnosis not present

## 2023-12-12 DIAGNOSIS — F449 Dissociative and conversion disorder, unspecified: Secondary | ICD-10-CM

## 2023-12-12 DIAGNOSIS — R9431 Abnormal electrocardiogram [ECG] [EKG]: Secondary | ICD-10-CM

## 2023-12-12 DIAGNOSIS — R4789 Other speech disturbances: Secondary | ICD-10-CM | POA: Diagnosis not present

## 2023-12-12 DIAGNOSIS — F202 Catatonic schizophrenia: Secondary | ICD-10-CM | POA: Diagnosis not present

## 2023-12-12 DIAGNOSIS — R55 Syncope and collapse: Secondary | ICD-10-CM | POA: Diagnosis not present

## 2023-12-12 DIAGNOSIS — R569 Unspecified convulsions: Secondary | ICD-10-CM

## 2023-12-12 DIAGNOSIS — F419 Anxiety disorder, unspecified: Secondary | ICD-10-CM | POA: Diagnosis not present

## 2023-12-12 DIAGNOSIS — R404 Transient alteration of awareness: Secondary | ICD-10-CM | POA: Diagnosis not present

## 2023-12-12 LAB — BASIC METABOLIC PANEL WITH GFR
Anion gap: 10 (ref 5–15)
Anion gap: 11 (ref 5–15)
Anion gap: 9 (ref 5–15)
Anion gap: 8 (ref 5–15)
BUN: <5 mg/dL — ABNORMAL LOW (ref 8–23)
BUN: <5 mg/dL — ABNORMAL LOW (ref 8–23)
BUN: 5 mg/dL — ABNORMAL LOW (ref 8–23)
BUN: 12 mg/dL (ref 8–23)
CO2: 25 mmol/L (ref 22–32)
CO2: 24 mmol/L (ref 22–32)
CO2: 25 mmol/L (ref 22–32)
CO2: 26 mmol/L (ref 22–32)
Calcium: 8.8 mg/dL — ABNORMAL LOW (ref 8.9–10.3)
Calcium: 8.8 mg/dL — ABNORMAL LOW (ref 8.9–10.3)
Calcium: 9 mg/dL (ref 8.9–10.3)
Calcium: 8.6 mg/dL — ABNORMAL LOW (ref 8.9–10.3)
Chloride: 96 mmol/L — ABNORMAL LOW (ref 98–111)
Chloride: 96 mmol/L — ABNORMAL LOW (ref 98–111)
Chloride: 93 mmol/L — ABNORMAL LOW (ref 98–111)
Chloride: 93 mmol/L — ABNORMAL LOW (ref 98–111)
Creatinine, Ser: 0.6 mg/dL (ref 0.44–1.00)
Creatinine, Ser: 0.62 mg/dL (ref 0.44–1.00)
Creatinine, Ser: 0.64 mg/dL (ref 0.44–1.00)
Creatinine, Ser: 0.73 mg/dL (ref 0.44–1.00)
GFR, Estimated: >60 mL/min (ref >60)
GFR, Estimated: >60 mL/min (ref >60)
GFR, Estimated: >60 mL/min (ref >60)
GFR, Estimated: >60 mL/min (ref >60)
Glucose, Bld: 79 mg/dL (ref 70–99)
Glucose, Bld: 83 mg/dL (ref 70–99)
Glucose, Bld: 128 mg/dL — ABNORMAL HIGH (ref 70–99)
Glucose, Bld: 97 mg/dL (ref 70–99)
Potassium: 3.9 mmol/L (ref 3.5–5.1)
Potassium: 4.1 mmol/L (ref 3.5–5.1)
Potassium: 3.7 mmol/L (ref 3.5–5.1)
Potassium: 3.6 mmol/L (ref 3.5–5.1)
Sodium: 131 mmol/L — ABNORMAL LOW (ref 135–145)
Sodium: 131 mmol/L — ABNORMAL LOW (ref 135–145)
Sodium: 127 mmol/L — ABNORMAL LOW (ref 135–145)
Sodium: 127 mmol/L — ABNORMAL LOW (ref 135–145)

## 2023-12-12 LAB — URINALYSIS, ROUTINE W REFLEX MICROSCOPIC
Bilirubin Urine: NEGATIVE
Glucose, UA: NEGATIVE mg/dL
Hgb urine dipstick: NEGATIVE
Ketones, ur: NEGATIVE mg/dL
Nitrite: NEGATIVE
Protein, ur: NEGATIVE mg/dL
Specific Gravity, Urine: 1.034 — ABNORMAL HIGH (ref 1.005–1.030)
pH: 7 (ref 5.0–8.0)

## 2023-12-12 LAB — SODIUM, URINE, RANDOM: Sodium, Ur: 62 mmol/L

## 2023-12-12 LAB — LIPID PANEL
Cholesterol: 157 mg/dL (ref 0–200)
HDL: 66 mg/dL (ref >40)
LDL Cholesterol: 86 mg/dL (ref 0–99)
Total CHOL/HDL Ratio: 2.4 ratio
Triglycerides: 26 mg/dL (ref <150)
VLDL: 5 mg/dL (ref 0–40)

## 2023-12-12 LAB — RAPID URINE DRUG SCREEN, HOSP PERFORMED
Amphetamines: NOT DETECTED
Barbiturates: NOT DETECTED
Benzodiazepines: POSITIVE — AB
Cocaine: NOT DETECTED
Opiates: NOT DETECTED
Tetrahydrocannabinol: NOT DETECTED

## 2023-12-12 LAB — ECHOCARDIOGRAM COMPLETE
AR max vel: 1.81 cm2
AV Area VTI: 1.9 cm2
AV Area mean vel: 1.78 cm2
AV Mean grad: 2 mmHg
AV Peak grad: 3.7 mmHg
Ao pk vel: 0.96 m/s
Area-P 1/2: 4.71 cm2
Height: 56 in
S' Lateral: 1.6 cm
Weight: 1227.52 [oz_av]

## 2023-12-12 LAB — PREALBUMIN: Prealbumin: 16 mg/dL — ABNORMAL LOW (ref 18–38)

## 2023-12-12 LAB — CORTISOL: Cortisol, Plasma: 13.2 ug/dL

## 2023-12-12 LAB — OSMOLALITY, URINE: Osmolality, Ur: 283 mosm/kg — ABNORMAL LOW (ref 300–900)

## 2023-12-12 LAB — CORTISOL-AM, BLOOD: Cortisol - AM: 13.2 ug/dL (ref 6.7–22.6)

## 2023-12-12 LAB — CREATININE, URINE, RANDOM: Creatinine, Urine: 33 mg/dL

## 2023-12-12 MED ORDER — ALUM & MAG HYDROXIDE-SIMETH 200-200-20 MG/5ML PO SUSP
15.0000 mL | Freq: Four times a day (QID) | ORAL | Status: DC | PRN
  Administered 2023-12-12 – 2023-12-13 (×2): 15 mL via ORAL
  Filled 2023-12-12 (×3): qty 30

## 2023-12-12 MED ORDER — ENSURE PLUS HIGH PROTEIN PO LIQD
237.0000 mL | Freq: Two times a day (BID) | ORAL | Status: DC
  Administered 2023-12-13: 237 mL via ORAL

## 2023-12-12 MED ORDER — ONDANSETRON HCL 4 MG/2ML IJ SOLN
4.0000 mg | Freq: Four times a day (QID) | INTRAMUSCULAR | Status: DC | PRN
  Administered 2023-12-12: 4 mg via INTRAVENOUS
  Filled 2023-12-12: qty 2

## 2023-12-12 MED ORDER — IOHEXOL 350 MG/ML SOLN
75.0000 mL | Freq: Once | INTRAVENOUS | Status: AC | PRN
  Administered 2023-12-12: 75 mL via INTRAVENOUS

## 2023-12-12 MED ORDER — SODIUM CHLORIDE 0.9 % IV SOLN
25.0000 mg | Freq: Four times a day (QID) | INTRAVENOUS | Status: DC | PRN
  Filled 2023-12-12: qty 1

## 2023-12-12 NOTE — TOC CAGE-AID Note (Signed)
 Transition of Care [facility]) - CAGE-AID Screening   Patient Details  Name: Donna Francis MRN: [id number] Date of Birth: Apr 20, 1961  [facility] CM/SW Contact:    Lorenz Donley E Kalisi Bevill, LCSW Phone Number: 12/12/2023, 1:32 PM   Clinical Narrative: Patient states she does not use drugs or alcohol .   CAGE-AID Screening:    Have You Ever Felt You Ought to Cut Down on Your Drinking or Drug Use?: No Have People Annoyed You By Critizing Your Drinking Or Drug Use?: No Have You Felt Bad Or Guilty About Your Drinking Or Drug Use?: No Have You Ever Had a Drink or Used Drugs First Thing In The Morning to Steady Your Nerves or to Get Rid of a Hangover?: No CAGE-AID Score: 0  Substance Abuse Education Offered: No

## 2023-12-12 NOTE — Progress Notes (Signed)
 Patient admitted for unresponsive episode.  Patient is under Medicare observation and PT/OT evaluation pending at this time.  IP Care management will follow the patient for needs.

## 2023-12-12 NOTE — Consult Note (Signed)
 [facility] Psychiatric Consult Initial  Patient Name: .Donna Francis  MRN: [id number]  DOB: 12-11-1960  Consult Order details:  Orders (From admission, onward)     Start     Ordered   12/11/23 2143  IP CONSULT TO PSYCHIATRY       Ordering Provider: Doutova, Anastassia, MD  Provider:  (Not yet assigned)  Question Answer Comment  Location [facility]   Reason for Consult? catatonia? anxiety, severe mood disorder      12/11/23 2143             Mode of Visit: In person    Psychiatry Consult Evaluation  Service Date: December 12, 2023 LOS:  LOS: 0 days  Chief Complaint   Primary Psychiatric Diagnoses  Adjustment disorder with mixed features 2.  Functional neurological disorder   Assessment  Donna Francis is a 63 y.o. female admitted: Medically at 12/11/2023  5:16 PM for altered mental status and speech change. She carries the psychiatric diagnoses of anxiety, panic attacks and has a past medical history of seizures, IBS, asthma, fibromyalgia, HLD, and RA.   Her current presentation of increased anxiety and low mood in the setting of multiple psychosocial stressors is most consistent with adjustment disorder with mixed features.  Patient describes a history of worsening somatic and neurological symptoms when she is stressed. Neurological workup of these symptoms has been negative so far and does not correlate to any focal deficit in particular. Suspect patient has functional neurological disorder given her long history of anxiety, somatic symptoms, PNES, and negative workup to date. Current outpatient psychotropic medications include Xanax  for anxiety and historically she has had a good response to these medications. She was compliant with medications prior to admission as evidenced by PDMP.  Discussed with patient relationship between anxiety and her symptomatology.  Patient reports she sees a therapist for anxiety.  Patient could benefit in the future  from a medication to address chronic anxiety, however will defer this to her primary care doctor.  We will sign off at this time.  Please see plan below for detailed recommendations.   Diagnoses:  Active Hospital problems: Principal Problem:   Unresponsive episode Active Problems:   Anemia, unspecified   Anxiety   GERD (gastroesophageal reflux disease)   IBS (irritable bowel syndrome)   Rheumatoid arthritis (HCC)   Abdominal pain   Weight loss   Mixed hyperlipidemia   Hyponatremia   Abnormal EKG    Plan   ## Psychiatric Medication Recommendations:  -Continue Xanax  1 mg 4 times daily  ## Medical Decision Making Capacity: Not specifically addressed in this encounter  ## Further Work-up:  -- Per primary team -- most recent EKG on 12/12/23 had QtC of 424 -- Pertinent lab work/imaging reviewed earlier this admission includes: MRI, EEG, CBC, CMP (Na 126), Vitamin B1, Ammonia, TSH   ## Disposition:-- There are no psychiatric contraindications to discharge at this time  ## Behavioral / Environmental: - No specific recommendations at this time.     ## Safety and Observation Level:  - Based on my clinical evaluation, I estimate the patient to be at low risk of self harm in the current setting. - At this time, we recommend  routine monitoring. This decision is based on my review of the chart including patient's history and current presentation, interview of the patient, mental status examination, and consideration of suicide risk including evaluating suicidal ideation, plan, intent, suicidal or self-harm behaviors, risk factors, and protective factors. This  judgment is based on our ability to directly address suicide risk, implement suicide prevention strategies, and develop a safety plan while the patient is in the clinical setting. Please contact our team if there is a concern that risk level has changed.  CSSR Risk Category:C-SSRS RISK CATEGORY: No Risk  Suicide Risk  Assessment: Patient has following modifiable risk factors for suicide: current symptoms: anxiety/panic, insomnia, impulsivity, anhedonia, hopelessness and pain, medical illness (ie new dx of cancer), which we are addressing by continuing medications, recommending outpatient follow-up. Patient has following non-modifiable or demographic risk factors for suicide:  Patient has the following protective factors against suicide: Access to outpatient mental health care, no history of suicide attempts, and no history of NSSIB  Thank you for this consult request. Recommendations have been communicated to the primary team.  We will sign off at this time.   Ashley LOISE Gravely, MD PGY-1       History of Present Illness  Relevant Aspects of [facility] Course:  Admitted on 12/11/2023 for altered mental status and speech change.  Patient was initially thought to be code stroke, however they completed a neurological workup, which has been negative so far.   Patient Report:   On initial interview, the patient is fully alert and oriented.  She begins the interview by stating that she is dealing with a lot of anxiety related to having to helping her 4 adult children while dealing with several medical issues.  She states that she is currently in the hospital with abdominal pain, and endorses other symptoms such as weakness in her extremities.  She states that she feels like whenever she gets anxious her physical symptoms manifest and this worsens her anxiety.  She feels like she is stuck in a loop.  Patient reports sleep dysregulation, recent low mood.  She reports she has been seeing a therapist weekly for several years.  She takes Xanax  for her anxiety, which she reports has been helpful.  She reports a past history of PNES, and states she has not had an episode in 4 years.  Psych ROS:  Depression: Yes, see patient report Anxiety:  Yes, see patient report Mania (lifetime and current): No Psychosis:  (lifetime and current): No   Psychiatric and Social History  Psychiatric History:  Information collected from patient and chart review  Prev Dx/Sx: Anxiety, panic attacks, PNES Current Psych Provider: Reports she does not have a psychiatrist but receives psychiatric care from her PCP who checks in with a psychiatrist Home Meds (current): Xanax  1 mg 4 times daily Previous Med Trials: Reports previous SSRI use but does not name medications Therapy: [facility]  Prior Psych Hospitalization: None  Prior Self Harm: None  Prior Violence: None   Family Psych History: None  Family Hx suicide: None   Social History:  Patient has 4 adult children. She lives alone and supports herself on disability income.  Substance History Denies substance use history  Exam Findings   Vital Signs:  Temp:  [97.9 F (36.6 C)-98.7 F (37.1 C)] 98.7 F (37.1 C) (07/21 1206) Pulse Rate:  [86-118] 92 (07/21 1206) Resp:  [13-21] 18 (07/21 1206) BP: (122-153)/(78-95) 125/95 (07/21 1206) SpO2:  [97 %-100 %] 100 % (07/21 1206) Weight:  [34.8 kg-37 kg] 34.8 kg (07/21 0112) Blood pressure (!) 125/95, pulse 92, temperature 98.7 F (37.1 C), resp. rate 18, height 4' 8 (1.422 m), weight 34.8 kg, SpO2 100%. Body mass index is 17.2 kg/m.  Physical Exam  Mental Status Exam:  General Appearance: Casual and Well Groomed  Orientation:  Full (Time, Place, and Person)  Memory:  Recent;   Good Remote;   Good  Concentration:  Concentration: Good and Attention Span: Good  Recall:  NA  Attention  Good  Eye Contact:  Good  Speech:  Clear and Coherent and Normal Rate  Language:  Good  Volume:  Normal  Mood: Okay  Affect:  Appropriate, Congruent, and Constricted  Thought Process:  Coherent and Goal Directed  Thought Content:  Rumination about physical symptoms  Suicidal Thoughts:  No  Homicidal Thoughts:  No  Judgement:  Fair  Insight:  Fair  Psychomotor Activity:  Negative  Akathisia:  Negative  Fund of  Knowledge:  Good      Assets:  Communication Skills Desire for Improvement Housing Resilience  Cognition:  WNL  ADL's:  Intact  AIMS (if indicated):        Other History   These have been pulled in through the EMR, reviewed, and updated if appropriate.  Family History:  The patient's family history includes Diabetes in her mother; Pancreatic cancer in her mother.  Medical History: Past Medical History:  Diagnosis Date   Allergy    Anemia    Anxiety    Asthma    pt states diagnosed by Pulmonologist   Colitis    Fibromyalgia    GERD (gastroesophageal reflux disease)    pt reports buringing in abdomen that has recently started moving up higher into her chest   Heart rate fast    IBS (irritable bowel syndrome)    Interstitial cystitis    Osteopenia after menopause    Panic attacks    Rapid heartbeat    Rheumatoid arthritis (HCC)    Seizures (HCC)    UTI (urinary tract infection)    Varicose veins of bilateral lower extremities with pain     Surgical History: Past Surgical History:  Procedure Laterality Date   CESAREAN SECTION     x 4   COLONOSCOPY  1995   in [place]    ESOPHAGOGASTRODUODENOSCOPY  03/07/2020   [facility] Dr Larene. Normal upper endoscopy   LAPAROSCOPIC HYSTERECTOMY  04/26/2016   TUBAL LIGATION       Medications:   Current Facility-Administered Medications:    0.9 %  sodium chloride  infusion, , Intravenous, Continuous, Doutova, Anastassia, MD, Last Rate: 75 mL/hr at 12/12/23 0055, Restarted at 12/12/23 0055   acetaminophen  (TYLENOL ) tablet 650 mg, 650 mg, Oral, Q4H PRN, 650 mg at 12/12/23 1203 **OR** acetaminophen  (TYLENOL ) 160 MG/5ML solution 650 mg, 650 mg, Per Tube, Q4H PRN **OR** acetaminophen  (TYLENOL ) suppository 650 mg, 650 mg, Rectal, Q4H PRN, Doutova, Anastassia, MD   ALPRAZolam  (XANAX ) tablet 1 mg, 1 mg, Oral, QID, Doutova, Anastassia, MD, 1 mg at 12/12/23 9193   dicyclomine  (BENTYL ) capsule 10 mg, 10 mg, Oral, TID PRN,  Doutova, Anastassia, MD   loratadine  (CLARITIN ) tablet 10 mg, 10 mg, Oral, Daily, Doutova, Anastassia, MD, 10 mg at 12/12/23 9193   montelukast  (SINGULAIR ) tablet 5 mg, 5 mg, Oral, Daily, Doutova, Anastassia, MD, 5 mg at 12/12/23 0807   pantoprazole  (PROTONIX ) EC tablet 20 mg, 20 mg, Oral, Daily, Doutova, Anastassia, MD   polyethylene glycol (MIRALAX  / GLYCOLAX ) packet 17 g, 17 g, Oral, Daily, Doutova, Anastassia, MD, 17 g at 12/12/23 0808   sodium chloride  flush (NS) 0.9 % injection 3 mL, 3 mL, Intravenous, Once, Tegeler, [name] [name], MD   thiamine  (VITAMIN B1) injection 100 mg, 100 mg, Intravenous, Daily, Doutova,  Anastassia, MD, 100 mg at 12/12/23 9191  Allergies: Allergies  Allergen Reactions   Fentanyl    Haldol  [Haloperidol ]    Kenalog [Triamcinolone ] Other (See Comments)    Stomach cramps   Propofol Other (See Comments)    Hallucinations and anxiety. Only low dose Hallucinations and anxiety Hallucinations and anxiety. Only low dose   Proton Pump Inhibitors Other (See Comments)    Pt reports multiple side effects Pt reports multiple side effects Pt reports multiple side effects    Ashley LOISE Gravely, MD PGY-1

## 2023-12-12 NOTE — Progress Notes (Signed)
 EEG complete - results pending

## 2023-12-12 NOTE — Plan of Care (Signed)

## 2023-12-12 NOTE — Progress Notes (Signed)
*  PRELIMINARY RESULTS* Echocardiogram 2D Echocardiogram has been performed.  Donna Francis 12/12/2023, 12:01 PM

## 2023-12-12 NOTE — Evaluation (Signed)
 Physical Therapy Evaluation Patient Details Name: Donna Francis MRN: [id number] DOB: 09-May-1961 Today's Date: 12/12/2023  History of Present Illness  Pt is a 63 yo female presenting to [facility] on 12/11/23 for a code stroke after being found unresponsive.MRI and CT negative for acute abnormalities. PMH of anxiety, IBS, fibromyalgia, pseudoseizure, tension headache, RA.  Clinical Impression  Pt is poor historian, reporting she lives in single level apartment with level entry, somehow related to [facility]. Reports ambulating with SPC and driving to grocery store, but then states I haven't done that in a long time. Reports getting down into tub to bathe but then states I haven't done that in a long time. Pt is profoundly weak, and has PROM [facility] but is profoundly tight at end range, suggesting long bouts of immobility. Pt is mod I for bed mobility, but requires min A for transfers and min-modA for 8 feet of ambulation. Patient will benefit from continued inpatient follow up therapy, <3 hours/day. PT will continue to follow acutely and refer to Mobility Specialist.          If plan is discharge home, recommend the following: A lot of help with walking and/or transfers;A lot of help with bathing/dressing/bathroom;Assistance with cooking/housework;Direct supervision/assist for medications management;Direct supervision/assist for financial management;Assist for transportation;Help with stairs or ramp for entrance;Supervision due to cognitive status   Can travel by private vehicle   Yes    Equipment Recommendations Rolling walker (2 wheels)     Functional Status Assessment Patient has had a recent decline in their functional status and demonstrates the ability to make significant improvements in function in a reasonable and predictable amount of time.     Precautions / Restrictions Precautions Precautions: Fall Restrictions Weight Bearing Restrictions Per Provider Order: No      Mobility  Bed  Mobility Overal bed mobility: Modified Independent             General bed mobility comments: HoB elevated and use of bedrail, but able to get OOB and back in    Transfers Overall transfer level: Needs assistance Equipment used: Rolling walker (2 wheels) Transfers: Sit to/from Stand Sit to Stand: Min assist           General transfer comment: good power up, needs light assist to steady    Ambulation/Gait Ambulation/Gait assistance: Min assist, Mod assist Gait Distance (Feet): 8 Feet Assistive device: Rolling walker (2 wheels) Gait Pattern/deviations: Step-through pattern, Decreased step length - right, Decreased step length - left, Decreased dorsiflexion - right, Decreased dorsiflexion - left, Shuffle, Antalgic, Trunk flexed, Narrow base of support, Knees buckling Gait velocity: slowed Gait velocity interpretation: <1.31 ft/sec, indicative of household ambulator   General Gait Details: min A progressing to modA for steadying with short distance ambulation, gait with increasingly decrease velocity at 4 feet and knee buckling, pt requiring modA for getting back to bed        Balance Overall balance assessment: Needs assistance Sitting-balance support: Feet supported, Feet unsupported, Bilateral upper extremity supported Sitting balance-Leahy Scale: Poor     Standing balance support: Bilateral upper extremity supported, During functional activity, Reliant on assistive device for balance Standing balance-Leahy Scale: Poor                               Pertinent Vitals/Pain Pain Assessment Pain Assessment: Faces Faces Pain Scale: Hurts even more Pain Location: stomach. LE muscles with gentle stretch into end range of motion Pain  Descriptors / Indicators: Grimacing, Guarding, Moaning Pain Intervention(s): Limited activity within patient's tolerance, Monitored during session, Repositioned    Home Living Family/patient expects to be discharged to::  Assisted living [facility], for people with psychiatic problems)                 Home Equipment: Cane - quad Additional Comments: pt reports that she is living in an apartment through the [facility] at [employer], she reports that they will pick up prescriptions and run errands for people that need that, unclear what services she is getting other than the apartment it self    Prior Function Prior Level of Function : Driving;Independent/Modified Independent;Patient poor historian/Family not available             Mobility Comments: pt reports that she has been moving less and less lately and has been needing a cane and the furniture to get around, was driving but can't remember how long ago it was since she did. perseverates on having to get her driver's license renewed ADLs Comments: reports she gets down in tub to bathe because she does not trust the shower seat, reports no problem getting out but also that she can't remember when she last showered she has been so weak     Extremity/Trunk Assessment   Upper Extremity Assessment Upper Extremity Assessment: Defer to OT evaluation    Lower Extremity Assessment Lower Extremity Assessment: RLE deficits/detail;LLE deficits/detail RLE Deficits / Details: PROM WFL, strength grossly 2+/5 asterix with knee extension, can not reach full range due to weakness, tightness in gastrocs and hamstrings RLE Sensation: WNL RLE Coordination: decreased fine motor LLE Deficits / Details: PROM WFL, strength grossly 2+/5 asterix with knee extension, can not reach full range due to weakness, tightness in gastrocs and hamstrings LLE Sensation: WNL LLE Coordination: decreased fine motor    Cervical / Trunk Assessment Cervical / Trunk Assessment: Kyphotic  Communication   Communication Communication: No apparent difficulties    Cognition Arousal: Lethargic Behavior During Therapy: Flat affect, Anxious   PT - Cognitive impairments: Problem solving,  Safety/Judgement, Sequencing, Attention                       PT - Cognition Comments: pt with constant stream of consciousness rambling, will answer direct questions but quickly becomes internally distracted Following commands: Intact       Cueing Cueing Techniques: Verbal cues, Gestural cues, Tactile cues     General Comments General comments (skin integrity, edema, etc.): HR to 130s with short distance ambulation        Assessment/Plan    PT Assessment Patient needs continued PT services  PT Problem List Decreased strength;Decreased range of motion;Decreased activity tolerance;Decreased balance;Decreased mobility;Decreased coordination;Decreased cognition;Cardiopulmonary status limiting activity;Pain       PT Treatment Interventions DME instruction;Gait training;Functional mobility training;Therapeutic activities;Therapeutic exercise;Balance training;Cognitive remediation;Patient/family education    PT Goals (Current goals can be found in the Care Plan section)  Acute Rehab PT Goals PT Goal Formulation: Patient unable to participate in goal setting Time For Goal Achievement: 12/26/23 Potential to Achieve Goals: Fair    Frequency Min 2X/week        AM-PAC PT 6 Clicks Mobility  Outcome Measure Help needed turning from your back to your side while in a flat bed without using bedrails?: None Help needed moving from lying on your back to sitting on the side of a flat bed without using bedrails?: A Little Help needed moving to and from  a bed to a chair (including a wheelchair)?: A Lot Help needed standing up from a chair using your arms (e.g., wheelchair or bedside chair)?: A Little Help needed to walk in hospital room?: A Lot Help needed climbing 3-5 steps with a railing? : Total 6 Click Score: 15    End of Session Equipment Utilized During Treatment: Gait belt Activity Tolerance: Patient limited by fatigue;Other (comment) (profound weakness) Patient left: in  bed;with call bell/phone within reach;with bed alarm set Nurse Communication: Mobility status PT Visit Diagnosis: Unsteadiness on feet (R26.81);Other abnormalities of gait and mobility (R26.89);Muscle weakness (generalized) (M62.81);History of falling (Z91.81);Difficulty in walking, not elsewhere classified (R26.2);Adult, failure to thrive (R62.7);Pain Pain - part of body: Leg;Ankle and joints of foot (abdomen)    Time: 8971-8944 PT Time Calculation (min) (ACUTE ONLY): 27 min   Charges:   PT Evaluation $PT Eval Moderate Complexity: 1 Mod PT Treatments $Gait Training: 8-22 mins PT General Charges $$ ACUTE PT VISIT: 1 Visit         Charolett Yarrow B. Fleeta Lapidus PT, DPT Acute Rehabilitation Services Please use secure chat or  Call Office [phone]   [name] Fleeta [facility] 12/12/2023, 11:35 AM

## 2023-12-12 NOTE — Procedures (Signed)
 Patient Name: Donna Francis  MRN: [id number]  Epilepsy Attending: Arlin MALVA Krebs  Referring Physician/Provider: Jerri Pfeiffer, MD  Date: 12/12/2023 Duration: 22.23 mins  Patient history: 63 y.o. female with PMH of severe anxiety with panic attacks, fibromyalgia, IBS, pseudoseizure, tension headache presented to ED for acute onset mental status change, staring off, not answer questions, not talking, but able to track bilaterally, inconsistently blinking to visual threat bilaterally, moving bilateral extremities seems symmetrical. EEG to evaluate for seizure  Level of alertness: Awake, asleep  AEDs during EEG study: Xanax   Technical aspects: This EEG study was done with scalp electrodes positioned according to the 10-20 International system of electrode placement. Electrical activity was reviewed with band pass filter of 1-70Hz , sensitivity of 7 uV/mm, display speed of 7mm/sec with a 60Hz  notched filter applied as appropriate. EEG data were recorded continuously and digitally stored.  Video monitoring was available and reviewed as appropriate.  Description: The posterior dominant rhythm consists of 9-10 Hz activity of moderate voltage (25-35 uV) seen predominantly in posterior head regions, symmetric and reactive to eye opening and eye closing. Hyperventilation and photic stimulation were not performed.     IMPRESSION: This study is within normal limits. No seizures or epileptiform discharges were seen throughout the recording.  A normal interictal EEG does not exclude the diagnosis of epilepsy.   Jaylynn Mcaleer O Kamoni Depree

## 2023-12-12 NOTE — Hospital Course (Signed)
 63 year old female total/PMH of asthma, anxiety, panic attacks, paroxysmal tachycardia, rheumatoid arthritis, fibromyalgia, GERD, IBS, history of seizures/pseudoseizures who was admitted for from emergency room for episode of unresponsiveness. Her workup with CT scan of the brain, MRI have been negative.  Neurology and psychiatry evaluated the patient.  It appears that she has possible pseudoseizures.  Patient stated that she had those kind of incident in the past as well.  She attributes this time this incident could be related to sleep deprivation.  She still feels tired today otherwise no specific symptoms.

## 2023-12-12 NOTE — Telephone Encounter (Signed)
 Patient called from the hospital stating that she is having the same problems that has been calling about. She said that she thinks that she had the sleep deprivation that sent her over the edge and she thinks that she had some sleep paralysis. But they are saying that her GI problems were the contributing problems to her lack of sleep etc. But she did a MRI of the brain and the CT of abdomen and pelvis chest and head and neck and they are in the chart. She said that they will keep her overnight again tonight. She said she stomach been hurting more than has been more in the last 2 months than anything. Please advise

## 2023-12-12 NOTE — Progress Notes (Signed)
 STROKE TEAM PROGRESS NOTE   SUBJECTIVE (INTERVAL HISTORY) No family is at the bedside.  Overall her condition is completely resolved. She is lying in bed, talkative, neuro back to baseline. She stated that 4 years ago (according to chart, should be 6 years ago), she had multiple pseudoseizure episodes, manifested by whole body weakness, not able to respond, not able to talk, stare off but she was able to hear everything and understood everything and remembered everything. Over time, the episodes decreased frequency. Now, this time, she had sleep deprivation for the last 4 days, and yesterday she felt weak, although she was able to talk to her daughter over the phone in the morning but she had been weak and only sitting in couch and had not been eating. She felt that the sleep deprivation this time caused the episode yesterday.    OBJECTIVE Temp:  [97.9 F (36.6 C)-98.5 F (36.9 C)] 98.5 F (36.9 C) (07/21 0830) Pulse Rate:  [86-118] 86 (07/21 0830) Cardiac Rhythm: Sinus tachycardia (07/21 0749) Resp:  [13-21] 18 (07/21 0830) BP: (122-153)/(78-91) 122/78 (07/21 0830) SpO2:  [97 %-100 %] 97 % (07/21 0830) Weight:  [34.8 kg-37 kg] 34.8 kg (07/21 0112)  No results for input(s): GLUCAP in the last 168 hours. Recent Labs  Lab 12/06/23 0250 12/11/23 1721 12/11/23 1723 12/11/23 2117 12/12/23 0430 12/12/23 0935  NA 128* 126* 126*  --  131* 131*  K 3.7 3.7 3.6  --  3.9 4.1  CL 92* 90* 91*  --  96* 96*  CO2 24 24  --   --  25 24  GLUCOSE 99 99 97  --  79 83  BUN 5* 5* 3*  --  <5* <5*  CREATININE 0.61 0.54 0.70  --  0.60 0.62  CALCIUM 9.2 9.3  --   --  8.8* 8.8*  MG  --   --   --  2.2  --   --   PHOS  --   --   --  2.5  --   --    Recent Labs  Lab 12/06/23 0250 12/11/23 1721 12/11/23 2117  AST 26 25 22   ALT 23 23 20   ALKPHOS 69 61 59  BILITOT 0.7 0.6 0.5  PROT 7.8 7.3 6.5  ALBUMIN 3.8 3.8 3.3*   Recent Labs  Lab 12/06/23 0250 12/11/23 1721 12/11/23 1723 12/11/23 2117   WBC 4.6 5.9  --  5.3  NEUTROABS 2.2 3.6  --  3.2  HGB 12.7 12.7 13.6 12.0  HCT 38.5 37.9 40.0 35.6*  MCV 91.4 90.2  --  88.6  PLT 281 291  --  283   Recent Labs  Lab 12/11/23 2117  CKTOTAL 65   Recent Labs    12/11/23 1721  LABPROT 13.2  INR 1.0   Recent Labs    12/11/23 2225  COLORURINE STRAW*  LABSPEC 1.034*  PHURINE 7.0  GLUCOSEU NEGATIVE  HGBUR NEGATIVE  BILIRUBINUR NEGATIVE  KETONESUR NEGATIVE  PROTEINUR NEGATIVE  NITRITE NEGATIVE  LEUKOCYTESUR MODERATE*       Component Value Date/Time   CHOL 157 12/12/2023 0430   TRIG 26 12/12/2023 0430   HDL 66 12/12/2023 0430   CHOLHDL 2.4 12/12/2023 0430   VLDL 5 12/12/2023 0430   LDLCALC 86 12/12/2023 0430   Lab Results  Component Value Date   HGBA1C 5.1 12/11/2023      Component Value Date/Time   LABOPIA NONE DETECTED 12/11/2023 2225   COCAINSCRNUR NONE DETECTED 12/11/2023 2225  LABBENZ POSITIVE (A) 12/11/2023 2225   AMPHETMU NONE DETECTED 12/11/2023 2225   THCU NONE DETECTED 12/11/2023 2225   LABBARB NONE DETECTED 12/11/2023 2225    Recent Labs  Lab 12/11/23 1721  ETH <15    I have personally reviewed the radiological images below and agree with the radiology interpretations.  EEG adult Result Date: 12/12/2023 Francis Arlin KIDD, MD     12/12/2023 10:27 AM Patient Name: Donna Francis MRN: [id number] Epilepsy Attending: Arlin KIDD Francis Referring Physician/Provider: Jerri Pfeiffer, MD Date: 12/12/2023 Duration: 22.23 mins Patient history: 63 y.o. female with PMH of severe anxiety with panic attacks, fibromyalgia, IBS, pseudoseizure, tension headache presented to ED for acute onset mental status change, staring off, not answer questions, not talking, but able to track bilaterally, inconsistently blinking to visual threat bilaterally, moving bilateral extremities seems symmetrical. EEG to evaluate for seizure Level of alertness: Awake, asleep AEDs during EEG study: Xanax  Technical aspects: This EEG study was  done with scalp electrodes positioned according to the 10-20 International system of electrode placement. Electrical activity was reviewed with band pass filter of 1-70Hz , sensitivity of 7 uV/mm, display speed of 34mm/sec with a 60Hz  notched filter applied as appropriate. EEG data were recorded continuously and digitally stored.  Video monitoring was available and reviewed as appropriate. Description: The posterior dominant rhythm consists of 9-10 Hz activity of moderate voltage (25-35 uV) seen predominantly in posterior head regions, symmetric and reactive to eye opening and eye closing. Hyperventilation and photic stimulation were not performed.   IMPRESSION: This study is within normal limits. No seizures or epileptiform discharges were seen throughout the recording. A normal interictal EEG does not exclude the diagnosis of epilepsy. Donna Francis   CT ABDOMEN PELVIS W CONTRAST Result Date: 12/12/2023 EXAM: CT ABDOMEN AND PELVIS WITH CONTRAST 12/12/2023 12:40:52 AM TECHNIQUE: CT of the abdomen and pelvis was performed with the administration of intravenous contrast. Multiplanar reformatted images are provided for review. Automated exposure control, iterative reconstruction, and/or weight based adjustment of the mA/kV was utilized to reduce the radiation dose to as low as reasonably achievable. COMPARISON: 11/17/2023 CLINICAL HISTORY: Abdominal pain, acute, nonlocalized. FINDINGS: LOWER CHEST: No acute abnormality. LIVER: The liver is unremarkable. GALLBLADDER AND BILE DUCTS: Gallbladder is unremarkable. No biliary ductal dilatation. SPLEEN: No acute abnormality. PANCREAS: No acute abnormality. ADRENAL GLANDS: No acute abnormality. KIDNEYS, URETERS AND BLADDER: 9 mm simple right lower pole renal cyst (image 22), benign (Bosniak 1). No follow up is recommended. No stones in the kidneys or ureters. No hydronephrosis. No perinephric or periureteral stranding. Excretory contrast in the bladder. GI AND BOWEL:  Appendix is not discretely visualized. Stomach demonstrates no acute abnormality. There is no bowel obstruction. No bowel wall thickening. PERITONEUM AND RETROPERITONEUM: No ascites. No free air. VASCULATURE: Aorta is normal in caliber. LYMPH NODES: No lymphadenopathy. REPRODUCTIVE ORGANS: No acute abnormality. BONES AND SOFT TISSUES: Mild degenerative changes at L5-S1. No acute osseous abnormality. No focal soft tissue abnormality. IMPRESSION: 1. No acute findings in the abdomen or pelvis. Electronically signed by: Pinkie Pebbles MD 12/12/2023 12:51 AM EDT RP Workstation: [id number]   MR BRAIN WO CONTRAST Result Date: 12/12/2023 EXAM: MRI BRAIN WITHOUT CONTRAST 12/12/2023 12:09:27 AM TECHNIQUE: Multiplanar multisequence MRI of the head/brain was performed without the administration of intravenous contrast. COMPARISON: None available. CLINICAL HISTORY: Neuro deficit, acute, stroke suspected. FINDINGS: BRAIN AND VENTRICLES: No acute infarct. No intracranial hemorrhage. No mass. No midline shift. No hydrocephalus. The sella is unremarkable. Normal flow voids. Minimal nonspecific white  matter T2-weighted signal hyperintensities, which may be associated with early chronic small vessel disease or migraine headaches. ORBITS: No acute abnormality. SINUSES AND MASTOIDS: No acute abnormality. BONES AND SOFT TISSUES: Normal marrow signal. No acute soft tissue abnormality. IMPRESSION: 1. No acute intracranial abnormality. 2. Minimal nonspecific white matter T2-weighted signal hyperintensities, possibly related to early chronic small vessel disease or migraine headaches. Electronically signed by: Franky Stanford MD 12/12/2023 12:21 AM EDT RP Workstation: [id number]   DG Chest Portable 1 View Result Date: 12/11/2023 CLINICAL DATA:  Code stroke cp cough EXAM: PORTABLE CHEST 1 VIEW COMPARISON:  Chest x-ray 10/29/2021 FINDINGS: The heart and mediastinal contours are within normal limits. 2 cm nodularity overlying the lower  lobes consistent with nipple shadows. No focal consolidation. No pulmonary edema. No pleural effusion. No pneumothorax. No acute osseous abnormality. IMPRESSION: No active disease. Electronically Signed   By: Morgane  Naveau M.D.   On: 12/11/2023 19:40   CT ANGIO HEAD NECK W WO CM W PERF (CODE STROKE) Result Date: 12/11/2023 CLINICAL DATA:  Provided history: Neuro deficit, acute, stroke suspected. EXAM: CT ANGIOGRAPHY HEAD AND NECK CT PERFUSION BRAIN TECHNIQUE: Multidetector CT imaging of the head and neck was performed using the standard protocol during bolus administration of intravenous contrast. Multiplanar CT image reconstructions and MIPs were obtained to evaluate the vascular anatomy. Carotid stenosis measurements (when applicable) are obtained utilizing NASCET criteria, using the distal internal carotid diameter as the denominator. Multiphase CT imaging of the brain was performed following IV bolus contrast injection. Subsequent parametric perfusion maps were calculated using RAPID software. RADIATION DOSE REDUCTION: This exam was performed according to the departmental dose-optimization program which includes automated exposure control, adjustment of the mA and/or kV according to patient size and/or use of iterative reconstruction technique. CONTRAST:  Administered contrast not known at this time. COMPARISON:  Non-contrast head CT performed earlier today 12/11/2023. FINDINGS: CTA NECK FINDINGS Aortic arch: Common origin of the innominate and left common carotid arteries. Mild despite plaque within the proximal right subclavian artery. No hemodynamically significant innominate or proximal subclavian artery stenosis. Right carotid system: CCA and ICA patent within the neck without stenosis or significant atherosclerotic disease. Left carotid system: CCA and ICA patent within the neck without stenosis or significant atherosclerotic disease. Vertebral arteries: Codominant and patent within the neck without  stenosis or significant atherosclerotic disease. Skeleton: Nonspecific reversal of the expected cervical lordosis. Cervical spondylosis. No acute fracture or aggressive osseous lesion. Other neck: No neck mass or cervical lymphadenopathy Upper chest: No consolidation within the imaged lung apices. Biapical pleuroparenchymal scarring. Review of the MIP images confirms the above findings CTA HEAD FINDINGS Anterior circulation: The intracranial internal carotid arteries are patent. Atherosclerotic plaque within both vessels. Most notably, there is up to moderate stenosis of the cavernous right ICA. The M1 middle cerebral arteries are patent. No M2 proximal branch occlusion or high-grade proximal stenosis. The anterior cerebral arteries are patent. Hypoplastic right A1 segment. No intracranial aneurysm is identified. Posterior circulation: The intracranial vertebral arteries are patent. The basilar artery is patent. The posterior cerebral arteries are patent. Posterior communicating arteries are present bilaterally. Venous sinuses: Within the limitations of contrast timing, no convincing thrombus. Anatomic variants: As described. Review of the MIP images confirms the above findings CT Brain Perfusion Findings: CBF (<30%) Volume: 0mL Perfusion (Tmax>6.0s) volume: 0mL Mismatch Volume: 0mL Infarction Location: None identified. IMPRESSION: CTA neck: The common carotid, internal carotid and vertebral arteries are patent within the neck without stenosis or significant atherosclerotic disease. CTA  head: No proximal intracranial large vessel occlusion or high-grade proximal arterial stenosis identified. CT perfusion head: The perfusion software identifies no core infarct. The perfusion software identifies no critically hypoperfused parenchyma (utilizing the Tmax>6 seconds threshold). No mismatch volume reported. Electronically Signed   By: Rockey Childs D.O.   On: 12/11/2023 17:59   CT HEAD CODE STROKE WO CONTRAST Result  Date: 12/11/2023 CLINICAL DATA:  Code stroke. Neuro deficit, acute, stroke suspected. EXAM: CT HEAD WITHOUT CONTRAST TECHNIQUE: Contiguous axial images were obtained from the base of the skull through the vertex without intravenous contrast. RADIATION DOSE REDUCTION: This exam was performed according to the departmental dose-optimization program which includes automated exposure control, adjustment of the mA and/or kV according to patient size and/or use of iterative reconstruction technique. COMPARISON:  None. FINDINGS: Brain: Mild generalized cerebral atrophy. There is no acute intracranial hemorrhage. No demarcated cortical infarct. No extra-axial fluid collection. No evidence of an intracranial mass. No midline shift. Vascular: No hyperdense vessel.  Atherosclerotic calcifications. Skull: No calvarial fracture or aggressive osseous lesion. Sinuses/Orbits: No mass or acute finding within the imaged orbits. No significant paranasal sinus disease at the imaged levels. ASPECTS [facility] Stroke Program Early CT Score) - Ganglionic level infarction (caudate, lentiform nuclei, internal capsule, insula, M1-M3 cortex): 7 - Supraganglionic infarction (M4-M6 cortex): 3 Total score (0-10 with 10 being normal): 10 No evidence of an acute intracranial abnormality. These results were communicated to Dr. Jerri At 5:39 pmon 7/20/2025by text page via the [facility] messaging system. IMPRESSION: 1. No evidence of an acute intracranial abnormality. 2. Mild generalized cerebral atrophy. Electronically Signed   By: Rockey Childs D.O.   On: 12/11/2023 17:40   CT CHEST ABDOMEN PELVIS W CONTRAST Result Date: 11/17/2023 CLINICAL DATA:  Sepsis. Chest heaviness. Generalized abdominal pain. EXAM: CT CHEST, ABDOMEN, AND PELVIS WITH CONTRAST TECHNIQUE: Multidetector CT imaging of the chest, abdomen and pelvis was performed following the standard protocol during bolus administration of intravenous contrast. RADIATION DOSE REDUCTION: This exam was  performed according to the departmental dose-optimization program which includes automated exposure control, adjustment of the mA and/or kV according to patient size and/or use of iterative reconstruction technique. CONTRAST:  80mL OMNIPAQUE  IOHEXOL  300 MG/ML  SOLN COMPARISON:  CT scan abdomen and pelvis from 10/29/2021. FINDINGS: CT CHEST FINDINGS Cardiovascular: Normal cardiac size. No pericardial effusion. No aortic aneurysm. Mediastinum/Nodes: Visualized thyroid  gland appears grossly unremarkable. No solid / cystic mediastinal masses. The esophagus is nondistended precluding optimal assessment. No axillary, mediastinal or hilar lymphadenopathy by size criteria. Lungs/Pleura: The central tracheo-bronchial tree is patent. There are patchy areas of linear, plate-like atelectasis and/or scarring throughout bilateral lungs. There is biapical pleuroparenchymal disease, right more than left. No mass or consolidation. No pleural effusion or pneumothorax. No suspicious lung nodules. Musculoskeletal: The visualized soft tissues of the chest wall are grossly unremarkable. No suspicious osseous lesions. CT ABDOMEN PELVIS FINDINGS Hepatobiliary: The liver is normal in size. Non-cirrhotic configuration. No suspicious mass. These is mild diffuse hepatic steatosis. No intrahepatic or extrahepatic bile duct dilation. No calcified gallstones. Normal gallbladder wall thickness. No pericholecystic inflammatory changes. Pancreas: Unremarkable. No pancreatic ductal dilatation or surrounding inflammatory changes. Spleen: Within normal limits. No focal lesion. Adrenals/Urinary Tract: Adrenal glands are unremarkable. No suspicious renal mass. There is a 8 x 9 mm simple cyst in the right kidney interpolar region, laterally. No nephroureterolithiasis or obstructive uropathy on either side. Unremarkable urinary bladder. Stomach/Bowel: No disproportionate dilation of the small or large bowel loops. No evidence of abnormal bowel  wall  thickening or inflammatory changes. The appendix is unremarkable. Vascular/Lymphatic: No ascites or pneumoperitoneum. No abdominal or pelvic lymphadenopathy, by size criteria. No aneurysmal dilation of the major abdominal arteries. Reproductive: The uterus is surgically absent. No large adnexal mass. Other: The visualized soft tissues and abdominal wall are unremarkable. Musculoskeletal: No suspicious osseous lesions. IMPRESSION: 1. No acute inflammatory process identified within the chest, abdomen or pelvis. 2. No lung mass, consolidation, pleural effusion or pneumothorax. No bowel obstruction. 3. Multiple other nonacute observations, as described above. Electronically Signed   By: Ree Molt M.D.   On: 11/17/2023 17:07     PHYSICAL EXAM  Temp:  [97.9 F (36.6 C)-98.5 F (36.9 C)] 98.5 F (36.9 C) (07/21 0830) Pulse Rate:  [86-118] 86 (07/21 0830) Resp:  [13-21] 18 (07/21 0830) BP: (122-153)/(78-91) 122/78 (07/21 0830) SpO2:  [97 %-100 %] 97 % (07/21 0830) Weight:  [34.8 kg-37 kg] 34.8 kg (07/21 0112)  General - Well nourished, well developed, in no apparent distress.  Ophthalmologic - fundi not visualized due to noncooperation.  Cardiovascular - Regular rhythm and rate.  Mental Status -  Level of arousal and orientation to time, place, and person were intact. Language including expression, naming, repetition, comprehension was assessed and found intact. Fund of Knowledge was assessed and was intact.  Cranial Nerves II - XII - II - Visual field intact OU. III, IV, VI - Extraocular movements intact. V - Facial sensation intact bilaterally. VII - Facial movement intact bilaterally. VIII - Hearing & vestibular intact bilaterally. X - Palate elevates symmetrically. XI - Chin turning & shoulder shrug intact bilaterally. XII - Tongue protrusion intact.  Motor Strength - The patient's strength was normal in all extremities and pronator drift was absent.  Bulk was normal and  fasciculations were absent.   Motor Tone - Muscle tone was assessed at the neck and appendages and was normal.  Reflexes - The patient's reflexes were symmetrical in all extremities and she had no pathological reflexes.  Sensory - Light touch, temperature/pinprick were assessed and were symmetrical.    Coordination - The patient had normal movements in the hands and feet with no ataxia or dysmetria.  Tremor was absent.  Gait and Station - deferred.   ASSESSMENT/PLAN Donna Francis is a 63 y.o. female with history of severe anxiety with panic attacks, fibromyalgia, IBS, pseudoseizure, tension headache admitted for acute onset mental status change, staring off, not answer questions, not talking, but able to track bilaterally, inconsistently blinking to visual threat bilaterally, moving bilateral extremities seems symmetrical. CT no acute abnormality.  CTA head and neck no LVO, CTP negative.   Conversion disorder  Per pt, she had one episode of her typical pseudoseizure episode yesterday due to sleep deprivation for 4 days.  CT no acute abnormality.   CTA head and neck no LVO CTP negative. MRI  no acute infarct EEG normal limits 2D Echo  EF 55-60% LDL 86 HgbA1c 5.1 UDS benzo (meds) SCDs for VTE prophylaxis No antithrombotic prior to admission, now on No antithrombotic.  Ongoing aggressive stroke risk factor management Recommend outpt psychology and psychiatry follow up Therapy recommendations:  SNF Disposition:  pending  Hx of conversion disorder/pseudoseizure Tension HA Anxiety  Of note, in 09/2017 patient admitted for seizure versus pseudoseizure in the setting of significant stress at home.  Patient improved significantly, neurologically normal, no EEG pursued, concerning for a severe response to anxiety.   She also followed with Dr. Skeet as outpatient for tension headache,  anxiety.   Home medication including Xanax  1 mg 4 times a day.   BP management Stable Long  term BP goal normotensive  Other Stroke Risk Factors   Other Active Problems Anxiety / panic attack IBS Fibromyalgia    Hospital day # 0  Neurology will sign off. Please call with questions. Pt will follow up with Dr. Skeet in about 4 weeks. Thanks for the consult.   Ary Cummins, MD PhD Stroke Neurology 12/12/2023 11:23 AM    To contact Stroke Continuity provider, please refer to [email or web address]. After hours, contact General Neurology

## 2023-12-12 NOTE — Progress Notes (Addendum)
 Progress Note   Patient: Donna Francis FMW:[id number] DOB: Jan 30, 1961 DOA: 12/11/2023     0 DOS: the patient was seen and examined on 12/12/2023   Brief hospital course: 63 year old female total/PMH of asthma, anxiety, panic attacks, paroxysmal tachycardia, rheumatoid arthritis, fibromyalgia, GERD, IBS, history of seizures/pseudoseizures who was admitted for from emergency room for episode of unresponsiveness. Her workup with CT scan of the brain, MRI have been negative.  Neurology and psychiatry evaluated the patient.  It appears that she has possible pseudoseizures.  Patient stated that she had those kind of incident in the past as well.  She attributes this time this incident could be related to sleep deprivation.  She still feels tired today otherwise no specific symptoms.  Assessment and Plan: 63 year old female with multiple medical problems including but not limited to anxiety/panic attack, atrial tachycardia, seizures/pseudoseizures who was admitted for a episode of unresponsiveness.  So far workup has been negative.  1.  Episode of unresponsiveness/catatonia - Unclear etiology - Workup have been negative so far with negative MRI and EEG and CT scan of the brain. - Neurology and psychiatry have seen the patient - Psychiatrist advised no need for treatment for catatonia except treatment for anxiety she is already on - Patient attributes this to sleep deprivation which she had in the past. - Patient is still feels unwell and tired we will continue to monitor until tomorrow.  2. Abdominal pain Patient states chronic and recurrent ever since her hysterectomy. CT scan of abdomen pelvis insignificant  3.  Anemia, unspecified Currently stable   4. Anxiety History of severe anxiety patient reports she takes Xanax  1 mg 4 times a day Patient tangential She would benefit from psychiatry evaluation and management   5. GERD (gastroesophageal reflux disease) Patient states cannot  tolerate PPI ordered Mylanta   6. IBS (irritable bowel syndrome) Followed outpatient by GI   7. Mixed hyperlipidemia Check lipid panel   8. Rheumatoid arthritis (HCC) May be contributing to some of his symptoms will need to continue follow-up as an outpatient   9. Weight loss Patient decreased p.o. intake would benefit from nutritional consult check electrolytes magnesium and phosphate check thiamine  level check vitamin levels Severe anxiety may be interfering with eating appreciate behavioral health consult        Subjective: No respiratory symptoms, she feels unwell otherwise no specific complaints  Physical Exam: Vitals:   12/11/23 2100 12/12/23 0112 12/12/23 0830 12/12/23 1206  BP: (!) 143/85  122/78 (!) 125/95  Pulse: 94  86 92  Resp: 18  18 18   Temp: 98.5 F (36.9 C)  98.5 F (36.9 C) 98.7 F (37.1 C)  TempSrc: Oral     SpO2: 99%  97% 100%  Weight:  34.8 kg    Height:  4' 8 (1.422 m)     Constitutional: Alert, awake, calm, comfortable, emaciated HEENT: Neck supple Respiratory: clear to auscultation bilaterally, no wheezing, no crackles. Normal respiratory effort. No accessory muscle use.  Cardiovascular: Regular rate and rhythm, no murmurs / rubs / gallops. No extremity edema. 2+ pedal pulses. No carotid bruits.  Abdomen: no tenderness, no masses palpated. No hepatosplenomegaly. Bowel sounds positive.  Musculoskeletal: no clubbing / cyanosis. No joint deformity upper and lower extremities. Good ROM, no contractures. Normal muscle tone.  Skin: no rashes, lesions, ulcers. No induration Neurologic: CN 2-12 grossly intact. Sensation intact, DTR normal. Strength 5/5 x all 4 extremities.  Psychiatric: Normal judgment and insight. Alert and oriented x 3. Normal mood.  Data Reviewed:  CT of the abdomen and pelvis unremarkable  Family Communication: None available  Disposition: Status is: Observation The patient remains OBS appropriate and will d/c before 2  midnights.  Planned Discharge Destination: Home    Time spent: 35 minutes  Author: Leaha Cuervo, MD 12/12/2023 3:20 PM  For on call review [email or web address].

## 2023-12-12 NOTE — Evaluation (Signed)
 Speech Language Pathology Evaluation Patient Details Name: Donna Francis MRN: [id number] DOB: [birth year] Today's Date: 12/12/2023 Time: 8476-8461 SLP Time Calculation (min) (ACUTE ONLY): 15 min  Problem List:  Patient Active Problem List   Diagnosis Date Noted   Unresponsive episode 12/11/2023   Hyponatremia 12/11/2023   Abnormal EKG 12/11/2023   Precordial pain 04/16/2020   Shortness of breath 04/16/2020   Mixed hyperlipidemia 04/16/2020   Abdominal pain 04/14/2020   Atypical chest pain 04/14/2020   Weight loss 04/14/2020   Allergy    Asthma    Colitis    Fibromyalgia    GERD (gastroesophageal reflux disease)    IBS (irritable bowel syndrome)    Interstitial cystitis    Panic attacks    Rapid heartbeat    Rheumatoid arthritis (HCC)    Seizures (HCC)    UTI (urinary tract infection)    Varicose veins of bilateral lower extremities with pain    Malnutrition (HCC) 12/17/2019   Seizure-like activity (HCC) 10/18/2017   Anxiety 03/08/2016   Anemia, unspecified 12/25/2015   Palpitations 12/25/2015   Episodic mood disorder (HCC) 11/17/2010   Generalized anxiety disorder 11/17/2010   Past Medical History:  Past Medical History:  Diagnosis Date   Allergy    Anemia    Anxiety    Asthma    pt states diagnosed by Pulmonologist   Colitis    Fibromyalgia    GERD (gastroesophageal reflux disease)    pt reports buringing in abdomen that has recently started moving up higher into her chest   Heart rate fast    IBS (irritable bowel syndrome)    Interstitial cystitis    Osteopenia after menopause    Panic attacks    Rapid heartbeat    Rheumatoid arthritis (HCC)    Seizures (HCC)    UTI (urinary tract infection)    Varicose veins of bilateral lower extremities with pain    Past Surgical History:  Past Surgical History:  Procedure Laterality Date   CESAREAN SECTION     x 4   COLONOSCOPY  1995   in [place]    ESOPHAGOGASTRODUODENOSCOPY  03/07/2020   [facility] [address] [facility]. Normal upper endoscopy   LAPAROSCOPIC HYSTERECTOMY  04/26/2016   TUBAL LIGATION     HPI:  Pt is a 63 yo female presenting to [facility] on 12/11/23 for a code stroke after being found unresponsive.MRI and CT negative for acute abnormalities. PMH of anxiety, IBS, fibromyalgia, pseudoseizure, tension headache, RA   Assessment / Plan / Recommendation Clinical Impression  Speech/language/cognitive assessment completed. Pt presents with fluent, clear speech without dysarthria.  Attention, working memory, and awareness are WNL. She gave a detailed history of medical ailments.  Expressive language was WNL with semantics/syntax/pragmatics WNL. Auditory comprehension WNL. She expresses concerns re: decreased concentration and its impact on memory and reading. She describes her anxiety as significantly impactful on her life/function.  There are no deficits that warrant SLP intervention.  Our service will sign off. Pt agrees with plan.    SLP Assessment  SLP Recommendation/Assessment: Patient does not need any further Speech Language Pathology Services SLP Visit Diagnosis: Cognitive communication deficit (R41.841)                        SLP Evaluation Cognition  Overall Cognitive Status: Within Functional Limits for tasks assessed Arousal/Alertness: Awake/alert Orientation Level: Oriented X4 Attention: Sustained Sustained Attention: Appears intact Memory: Appears intact Awareness: Appears intact  Comprehension  Auditory Comprehension Overall Auditory Comprehension: Appears within functional limits for tasks assessed Yes/No Questions: Within Functional Limits Commands: Within Functional Limits Conversation: Complex Visual Recognition/Discrimination Discrimination: Within Function Limits Reading Comprehension Reading Status: Within funtional limits    Expression Expression Primary Mode of Expression: Verbal Verbal Expression Overall Verbal Expression: Appears  within functional limits for tasks assessed Initiation: No impairment Level of Generative/Spontaneous Verbalization: Conversation Repetition: No impairment Naming: No impairment Pragmatics: Impairment   Oral / Motor  Oral Motor/Sensory Function Overall Oral Motor/Sensory Function: Within functional limits Motor Speech Overall Motor Speech: Appears within functional limits for tasks assessed            Vona Palma Laurice 12/12/2023, 3:50 PM Jshawn Hurta L. Vona, MA CCC/SLP Clinical Specialist - Acute Care SLP Acute Rehabilitation Services Office number [phone]

## 2023-12-12 NOTE — TOC CM/SW Note (Signed)
 Patient Goals and CMS Choice     CSW spoke with pt  regarding recommendation for SNF. She stated that she has issues with her stomach and has a very strict diet and does want to go to a rehab facility.  She said that she has had home health in the past and would prefer that. Pt could not recall the name of the agency.  CSW notified RNCM.   CSW also reached out and spoke with pt's daughter [place], she said she would like to discuss with pt and pt's family and continue to make a decision regarding disposition plan. She stated that she and her other daughters do assist at home occasionally, but are also limited to support at times.    TOC will continue to follow.     Expected Discharge Plan and Services                                                Prior Living Arrangements/Services                       Activities of Daily Living   ADL Screening (condition at time of admission) Independently performs ADLs?: No Does the patient have a NEW difficulty with bathing/dressing/toileting/self-feeding that is expected to last >3 days?: Yes (Initiates electronic notice to provider for possible OT consult) Does the patient have a NEW difficulty with getting in/out of bed, walking, or climbing stairs that is expected to last >3 days?: Yes (Initiates electronic notice to provider for possible PT consult) Does the patient have a NEW difficulty with communication that is expected to last >3 days?: Yes (Initiates electronic notice to provider for possible SLP consult) Is the patient deaf or have difficulty hearing?: No Does the patient have difficulty seeing, even when wearing glasses/contacts?: No Does the patient have difficulty concentrating, remembering, or making decisions?: No  Permission Sought/Granted                  Emotional Assessment              Admission diagnosis:  Unresponsive episode [R40.4] Altered mental status, unspecified altered  mental status type [R41.82] Transient speech disturbance [R47.9] Patient Active Problem List   Diagnosis Date Noted   Unresponsive episode 12/11/2023   Hyponatremia 12/11/2023   Abnormal EKG 12/11/2023   Precordial pain 04/16/2020   Shortness of breath 04/16/2020   Mixed hyperlipidemia 04/16/2020   Abdominal pain 04/14/2020   Atypical chest pain 04/14/2020   Weight loss 04/14/2020   Allergy    Asthma    Colitis    Fibromyalgia    GERD (gastroesophageal reflux disease)    IBS (irritable bowel syndrome)    Interstitial cystitis    Panic attacks    Rapid heartbeat    Rheumatoid arthritis (HCC)    Seizures (HCC)    UTI (urinary tract infection)    Varicose veins of bilateral lower extremities with pain    Malnutrition (HCC) 12/17/2019   Seizure-like activity (HCC) 10/18/2017   Anxiety 03/08/2016   Anemia, unspecified 12/25/2015   Palpitations 12/25/2015   Episodic mood disorder (HCC) 11/17/2010   Generalized anxiety disorder 11/17/2010   PCP:  Erick Greig LABOR, NP Pharmacy:   [id number] - [place], [place] - [address]  [facility] [place] [address] Phone: [phone] Fax: [phone]     Social Determinants of Health (SDOH) Interventions    Readmission Risk Interventions     No data to display

## 2023-12-12 NOTE — Plan of Care (Signed)
  Problem: Ischemic Stroke/TIA Tissue Perfusion: Goal: Complications of ischemic stroke/TIA will be minimized Outcome: Progressing   Problem: Coping: Goal: Will identify appropriate support needs Outcome: Progressing   Problem: Nutrition: Goal: Risk of aspiration will decrease Outcome: Progressing   Problem: Pain Managment: Goal: General experience of comfort will improve and/or be controlled Outcome: Progressing   Problem: Education: Goal: Knowledge of disease or condition will improve Outcome: Not Progressing   Problem: Health Behavior/Discharge Planning: Goal: Ability to manage health-related needs will improve Outcome: Not Progressing   Problem: Health Behavior/Discharge Planning: Goal: Ability to manage health-related needs will improve Outcome: Not Progressing   Problem: Activity: Goal: Risk for activity intolerance will decrease Outcome: Not Progressing

## 2023-12-12 NOTE — Evaluation (Signed)
 Occupational Therapy Evaluation Patient Details Name: Donna Francis MRN: [id number] DOB: Apr 16, 1961 Today's Date: 12/12/2023   History of Present Illness   Pt is a 63 yo female presenting to [facility] on 12/11/23 for a code stroke after being found unresponsive.MRI and CT negative for acute abnormalities. PMH of anxiety, IBS, fibromyalgia, pseudoseizure, tension headache, RA.     Clinical Impressions Pt admitted for above, PTA pt reports being mod I for ADLs, driving and ind for her mobility. Pt currently presenting with impaired cognition, appears quite flustered throughout session. She remains unsteady and needs min A to assist with balance and mod A to setup A for ADLs. Pt would benefit from continued acute skilled OT services to address listed deficits and help progress to next level of care. Patient will benefit from continued inpatient follow up therapy, <3 hours/day.      If plan is discharge home, recommend the following:   A little help with walking and/or transfers;A little help with bathing/dressing/bathroom;Assistance with cooking/housework;Supervision due to cognitive status;Direct supervision/assist for financial management;Direct supervision/assist for medications management     Functional Status Assessment   Patient has had a recent decline in their functional status and demonstrates the ability to make significant improvements in function in a reasonable and predictable amount of time.     Equipment Recommendations   BSC/3in1     Recommendations for Other Services         Precautions/Restrictions   Precautions Precautions: Fall Recall of Precautions/Restrictions: Impaired Restrictions Weight Bearing Restrictions Per Provider Order: No     Mobility Bed Mobility Overal bed mobility: Modified Independent                  Transfers Overall transfer level: Needs assistance Equipment used: 1 person hand held assist Transfers: Sit to/from  Stand Sit to Stand: Min assist                  Balance Overall balance assessment: Needs assistance Sitting-balance support: Feet supported, Feet unsupported, Bilateral upper extremity supported Sitting balance-Leahy Scale: Fair Sitting balance - Comments: sitting EOB with CGA   Standing balance support: Bilateral upper extremity supported, During functional activity, Reliant on assistive device for balance Standing balance-Leahy Scale: Poor Standing balance comment: min A to bal.                           ADL either performed or assessed with clinical judgement   ADL Overall ADL's : Needs assistance/impaired Eating/Feeding: Independent;Bed level   Grooming: Sitting;Oral care;Set up Grooming Details (indicate cue type and reason): inc time, pt arms so weak that she has to stop and take a break multiple times. Upper Body Bathing: Sitting;Set up   Lower Body Bathing: Sitting/lateral leans;Contact guard assist   Upper Body Dressing : Sitting;Set up   Lower Body Dressing: Sit to/from stand;Moderate assistance   Toilet Transfer: Stand-pivot;Minimal assistance   Toileting- Clothing Manipulation and Hygiene: Sitting/lateral lean;Minimal assistance;Sit to/from stand       Functional mobility during ADLs: Minimal assistance (STS)       Vision Baseline Vision/History: 1 Wears glasses Ability to See in Adequate Light: 1 Impaired Patient Visual Report: No change from baseline Vision Assessment?: No apparent visual deficits     Perception Perception: Not tested       Praxis Praxis: WFL       Pertinent Vitals/Pain Pain Assessment Pain Assessment: Faces Faces Pain Scale: Hurts even more Pain Location: headache Pain  Descriptors / Indicators: Grimacing, Guarding, Moaning Pain Intervention(s): Limited activity within patient's tolerance, Monitored during session, Patient requesting pain meds-RN notified, RN gave pain meds during session      Extremity/Trunk Assessment Upper Extremity Assessment Upper Extremity Assessment: Generalized weakness;RUE deficits/detail;LUE deficits/detail RUE Deficits / Details: 3/5 MMT throughout, limited ability to keep arms held up. shoulder flexion 90 deg AROM RUE Sensation: WNL RUE Coordination: decreased gross motor LUE Deficits / Details: 3/5 MMT throughout, limited ability to keep arms held up. shoulder flexion 90 deg AROM LUE Sensation: WNL LUE Coordination: decreased gross motor   Lower Extremity Assessment RLE Deficits / Details: PROM WFL, strength grossly 2+/5 asterix with knee extension, can not reach full range due to weakness, tightness in gastrocs and hamstrings RLE Sensation: WNL RLE Coordination: decreased fine motor LLE Deficits / Details: PROM WFL, strength grossly 2+/5 asterix with knee extension, can not reach full range due to weakness, tightness in gastrocs and hamstrings LLE Sensation: WNL LLE Coordination: decreased fine motor   Cervical / Trunk Assessment Cervical / Trunk Assessment: Kyphotic   Communication Communication Communication: No apparent difficulties   Cognition Arousal: Alert Behavior During Therapy: Anxious Cognition: History of cognitive impairments             OT - Cognition Comments: pt perseverating on bad echo experience, upset about US  gel getting on sheets and gown. Pt a bit sporadic.                 Following commands: Intact       Cueing  General Comments   Cueing Techniques: Verbal cues;Gestural cues;Tactile cues  Pt cleaned, new gown donned, linens changed.   Exercises     Shoulder Instructions      Home Living Family/patient expects to be discharged to:: Assisted living [facility], for people with psychiatic problems)     Type of Home: Apartment                       Home Equipment: Cane - quad   Additional Comments: pt reports that she is living in an apartment through the [facility] [employer], she  reports that they will pick up prescriptions and run errands for people that need that, unclear what services she is getting other than the apartment it self      Prior Functioning/Environment Prior Level of Function : Driving;Independent/Modified Independent;Patient poor historian/Family not available             Mobility Comments: pt reports that she has been moving less and less lately and has been needing a cane and the furniture to get around, was driving but can't remember how long ago it was since she did. perseverates on having to get her driver's license renewed ADLs Comments: reports she gets down in tub to bathe because she does not trust the shower seat, reports no problem getting out but also that she can't remember when she last showered she has been so weak.  mod i typically    OT Problem List: Pain;Impaired balance (sitting and/or standing);Decreased strength;Decreased activity tolerance;Decreased cognition   OT Treatment/Interventions: Self-care/ADL training;Therapeutic exercise;Patient/family education;Balance training;Therapeutic activities;Cognitive remediation/compensation;DME and/or AE instruction      OT Goals(Current goals can be found in the care plan section)   Acute Rehab OT Goals Patient Stated Goal: to get a new gown on, get gel of her bed. OT Goal Formulation: With patient Time For Goal Achievement: 12/26/23 Potential to Achieve Goals: Good ADL Goals Pt Will  Perform Grooming: standing;with contact guard assist Pt Will Perform Lower Body Dressing: sit to/from stand;with contact guard assist Pt Will Transfer to Toilet: with contact guard assist;ambulating Pt/caregiver will Perform Home Exercise Program: Both right and left upper extremity;With theraband;Independently;With written HEP provided Additional ADL Goal #1: Pt will score WFL on a medication assessment to determine ind medication management skills Additional ADL Goal #2: Pt will demonstrate ind  use of energy conservation strategies to complete ADLs.   OT Frequency:  Min 2X/week    Co-evaluation              AM-PAC OT 6 Clicks Daily Activity     Outcome Measure Help from another person eating meals?: None Help from another person taking care of personal grooming?: A Little Help from another person toileting, which includes using toliet, bedpan, or urinal?: A Little Help from another person bathing (including washing, rinsing, drying)?: A Little Help from another person to put on and taking off regular upper body clothing?: A Little Help from another person to put on and taking off regular lower body clothing?: A Lot 6 Click Score: 18   End of Session Equipment Utilized During Treatment: Gait belt Nurse Communication: Mobility status  Activity Tolerance: Patient tolerated treatment well Patient left: in bed;with bed alarm set;with call bell/phone within reach  OT Visit Diagnosis: Other abnormalities of gait and mobility (R26.89);Unsteadiness on feet (R26.81);Muscle weakness (generalized) (M62.81);Pain;Other symptoms and signs involving cognitive function Pain - Right/Left:  (headache)                Time: 8845-8776 OT Time Calculation (min): 29 min Charges:  OT General Charges $OT Visit: 1 Visit OT Evaluation $OT Eval Moderate Complexity: 1 Mod OT Treatments $Self Care/Home Management : 8-22 mins  12/12/2023  AB, OTR/L  Acute Rehabilitation Services  Office: [phone]   Curtistine JONETTA Das 12/12/2023, 4:08 PM

## 2023-12-12 NOTE — Progress Notes (Signed)
 PT Cancellation Note  Patient Details Name: Donna Francis MRN: [id number] DOB: 1960/12/22   Cancelled Treatment:    Reason Eval/Treat Not Completed: (P) Other (comment) (Physician in room) Physician having lengthy conversation with pt. PT will follow back later for Evaluation.  [name] B. Fleeta Lapidus PT, DPT Acute Rehabilitation Services Please use secure chat or  Call Office [phone]    [name] Fleeta [facility] 12/12/2023, 9:51 AM

## 2023-12-13 DIAGNOSIS — R55 Syncope and collapse: Secondary | ICD-10-CM | POA: Diagnosis not present

## 2023-12-13 DIAGNOSIS — R404 Transient alteration of awareness: Secondary | ICD-10-CM | POA: Diagnosis not present

## 2023-12-13 LAB — BASIC METABOLIC PANEL WITH GFR
Anion gap: 7 (ref 5–15)
Anion gap: 12 (ref 5–15)
BUN: 14 mg/dL (ref 8–23)
BUN: 8 mg/dL (ref 8–23)
CO2: 26 mmol/L (ref 22–32)
CO2: 24 mmol/L (ref 22–32)
Calcium: 8.6 mg/dL — ABNORMAL LOW (ref 8.9–10.3)
Calcium: 8.9 mg/dL (ref 8.9–10.3)
Chloride: 96 mmol/L — ABNORMAL LOW (ref 98–111)
Chloride: 97 mmol/L — ABNORMAL LOW (ref 98–111)
Creatinine, Ser: 0.69 mg/dL (ref 0.44–1.00)
Creatinine, Ser: 0.68 mg/dL (ref 0.44–1.00)
GFR, Estimated: >60 mL/min (ref >60)
GFR, Estimated: >60 mL/min (ref >60)
Glucose, Bld: 87 mg/dL (ref 70–99)
Glucose, Bld: 89 mg/dL (ref 70–99)
Potassium: 3.9 mmol/L (ref 3.5–5.1)
Potassium: 4 mmol/L (ref 3.5–5.1)
Sodium: 129 mmol/L — ABNORMAL LOW (ref 135–145)
Sodium: 133 mmol/L — ABNORMAL LOW (ref 135–145)

## 2023-12-13 MED ORDER — BISACODYL 10 MG RE SUPP: 10.0000 mg | Freq: Every day | RECTAL | Status: DC | PRN

## 2023-12-13 MED ORDER — KATE FARMS STANDARD 1.4 PO LIQD
325.0000 mL | Freq: Two times a day (BID) | ORAL | Status: DC
  Filled 2023-12-13: qty 325

## 2023-12-13 MED ORDER — ALPRAZOLAM 0.5 MG PO TABS
1.0000 mg | ORAL_TABLET | Freq: Three times a day (TID) | ORAL | Status: DC | PRN
  Administered 2023-12-13 (×2): 1 mg via ORAL
  Filled 2023-12-13 (×2): qty 2

## 2023-12-13 MED ORDER — ADULT MULTIVITAMIN LIQUID CH
15.0000 mL | Freq: Every day | ORAL | Status: DC
  Filled 2023-12-13: qty 15

## 2023-12-13 MED ORDER — THIAMINE MONONITRATE 100 MG PO TABS
100.0000 mg | ORAL_TABLET | Freq: Every day | ORAL | Status: DC
  Administered 2023-12-13: 100 mg via ORAL
  Filled 2023-12-13: qty 1

## 2023-12-13 MED ORDER — BISACODYL 10 MG RE SUPP: 10.0000 mg | Freq: Every day | RECTAL | 0 refills | Status: AC | PRN

## 2023-12-13 NOTE — Progress Notes (Addendum)
 Nutrition Follow-up  DOCUMENTATION CODES:   Underweight, Severe malnutrition in context of chronic illness  INTERVENTION:   Encourage Po intake - Currently on Regular diet  Trial [employer] 1.4 PO BID, each supplement provides 455 kcal and 20 grams protein.  10 AM and 2 PM snacks  Provided BD/colitis Nutrition therapy handout form the [employer].  Liquid Multivitamin daily  Continue 100 mg Thiamine  daily x 7 days  Recommend checking magnesium, phosphorus, potassium daily x 3 days  if pt does not discharge home today Pt may benefit from nutrition support in the future if continued GI issues and poor PO intake   *Per pt, she does not tolerate vitamin pills PO  NUTRITION DIAGNOSIS:   Severe Malnutrition related to chronic illness as evidenced by severe fat depletion, severe muscle depletion, energy intake < 75% for > or equal to 1 month.   GOAL:   Weight gain, Patient will meet greater than or equal to 90% of their needs   MONITOR:   PO intake, Supplement acceptance, Labs, Weight trends  REASON FOR ASSESSMENT:   Consult Assessment of nutrition requirement/status  ASSESSMENT:  63 y.o female with multiple visits to the ED since April 2025. Most recently 7/15 for abdominal pain, nausea, anxiety, and trouble sleeping.  Presented 7/20 to ED after an episdoe of unresponsivenss, admied for futher tests/generalized weakness. PMH of asthma, severe anxiety and depression, panic attacks, fibromyalgia, GERD, IBS, seizures, gastritis, colitis, costochondritis.  Pt with complicated nutritional history. Follows with Dr.Gupta from [facility].   Pt reports she started having GI issues after her hysterectomy in 2017. Went from 97 lbs to now 76 lbs since then. Pt reports she has always been small. Endorses now eight loss in the last year. Weight seems weight stable however is below what is optimal.  Pt reports she can only eat boiled chicken, seafood, berries, apple  sauce, velveta breakfast cookies, blueberry greek yogurt, white rice. Cannot tolerate Ensure as it hurts her stomach. Was able to tolerate Pediasure and was drinking 4 a day but stopped because it was doing nothing for her. Pt states she did not gain any weight with them. Has been eating 6 small meals per day and has been able to maintain a stable weight around 70-80 lbs in the least year. Does not eat after 7 PM.   Within the last month pt endorses decreased appetite, nausea with no vomiting, abdominal pain, muscle cramping, and sleep deprivation. Her intake has decreased dramatically in the last couple of weeks, pt reports she had to force herself to eat and eventually was only able to eat applesauce up to admission.   Pt with severe muscle and fat wasting, qualifies for severe malnutrition. RD encouraged increased PO intake and offered interventions. Pt reports she is being discharged today and does not want to start anything. She was willing to try a plant based protein shake as sh has been able to tolerate them in the past. Rd encouraged pt to incorporate these shakes at home. RD will also add other interventions in case pt does not discharge home today. RD also dicussed the possibility of a PEJ or TPN to patient, pt declined.  RD also discussed best eating practices for patients with IBS/colitis such as limiting fat, frequent small meals, avoiding fiber when during a flare up etc. RD provided IBD/colitis Nutrition therapy handout form the Academy of [employer]. RD wanted pt to start a MVI however pt reports any type of  vitamin pill she does not tolerate but does tolerate liquid forms, RD will add.   Suspect pt's anxiety is a contributing factor to her poor PO intake as well as her fear of food causing her GI distress. Encouraged pt to slowly incorporate foods into her diet. Pt explained she is on a limited income and cannot spend money on food that she may not be able to tolerate.    Since admission pt reports she has bene eating 75-100% of her meals here Endorses increased appetite. Encouraged pt to follow up with GI.   Dietary recall:  Meal 1: 2 Velveeta breakfast cookies and applesauce  Meal 2: Berries and banana Meal 3: 1/2 Bagel with plant based butter  Meal 4: Bagel with  boiled chicken, [place] bacon, applesauce, breakfast cookies Meal 5: Blueberry Greek choboni yogurt Meal 6: Jasmine rice/basmati rice, boiled chicken or fish/shrimp, green beans   Admit weight: 37 kg - ? Accuracy  Current weight: 34.8 kg   Average Meal Intake: No meals recorded   Nutritionally Relevant Medications: Scheduled Meds:  feeding supplement  237 mL Oral BID BM   feeding supplement (KATE FARMS STANDARD 1.4)  325 mL Oral BID BM   loratadine   10 mg Oral Daily   montelukast   5 mg Oral Daily   multivitamin  15 mL Oral Daily   pantoprazole   20 mg Oral Daily   polyethylene glycol  17 g Oral Daily   sodium chloride  flush  3 mL Intravenous Once   thiamine   100 mg Oral Daily   Continuous Infusions:  sodium chloride  Stopped (12/13/23 1027)   promethazine  (PHENERGAN ) injection (IM or IVPB)      Labs Reviewed: Sodium 133 No Mag, Phos, K checked  CBG ranges from 79-128 mg/dL over the last 24 hours HgbA1c 5.1  NUTRITION - FOCUSED PHYSICAL EXAM:  Flowsheet Row Most Recent Value  Orbital Region Severe depletion  Upper Arm Region Severe depletion  Thoracic and Lumbar Region Severe depletion  Buccal Region Severe depletion  Temple Region Severe depletion  Clavicle Bone Region Severe depletion  Clavicle and Acromion Bone Region Severe depletion  Scapular Bone Region Severe depletion  Dorsal Hand Severe depletion  Patellar Region Severe depletion  Anterior Thigh Region Severe depletion  Posterior Calf Region Severe depletion  Edema (RD Assessment) None  Hair Reviewed  Eyes Reviewed  Mouth Reviewed  Skin Reviewed  Nails Reviewed    Diet Order:   Diet Order              Diet general           Diet regular Room service appropriate? Yes; Fluid consistency: Thin  Diet effective now                   EDUCATION NEEDS:   Education needs have been addressed  Skin:     Last BM:     Height:   Ht Readings from Last 1 Encounters:  12/12/23 4' 8 (1.422 m)    Weight:   Wt Readings from Last 1 Encounters:  12/12/23 34.8 kg    Ideal Body Weight:     BMI:  Body mass index is 17.2 kg/m.  Estimated Nutritional Needs:   Kcal:     Protein:     Fluid:      Olivia Kenning, RD Registered Dietitian  See Amion for more information

## 2023-12-13 NOTE — TOC Progression Note (Addendum)
 Transition of Care [facility]) - Progression Note    Patient Details  Name: Donna Francis MRN: [id number] Date of Birth: November 12, 1960  Transition of [facility]) CM/SW Contact  Rosaline JONELLE Joe, RN Phone Number: 12/13/2023, 10:35 AM  Clinical Narrative:    CM met with the patient at the bedside after [place], MSW spoke with her and the patient is declining SNF placement and wants to return home with home health services.  I met with the patient and she lives alone in an apartment through [facility] who provide transportation for errands and appointments.  Patient has old RW at daughter's home but states that she prefers to use a Medical laboratory scientific officer.  Patient declines 3:1 and states that she has a Medical laboratory scientific officer and shower seat at home.  Patient was agreeable to order RW to be delivered - Medicare choice was offered and patient did not have a preference.  DMe rolling walker ordered to be co-signed by MD.  [employer] was called to delivered to the bedside.  Patient states that she would like home health through [facility]  - [facility] order placed for PT,OT - MD to co-sign.  I called [facility] and decision is pending.  Patient will likely discharge home.   Patient states that she plans to call her daughter to provide transportation to home today if possible.  12/13/23 1600 - Patient states that [employer] called and states that patient already received a RW in the last 5 years and the patient plans to go home with daughter without need for a RW.  Patient states that her daughter likely has the RW at her home.  Daughter is providing transportation to home via car.        Expected Discharge Plan and Services                                               Social Determinants of Health (SDOH) Interventions SDOH Screenings   Food Insecurity: No Food Insecurity (12/11/2023)  Housing: Low Risk  (12/11/2023)  Transportation Needs: No Transportation Needs (12/11/2023)  Utilities: Not At Risk (12/11/2023)  Tobacco  Use: Low Risk  (12/11/2023)    Readmission Risk Interventions     No data to display

## 2023-12-13 NOTE — Discharge Summary (Signed)
 Physician Discharge Summary  Donna Francis FMW:[id number] DOB: 09/18/1960 DOA: 12/11/2023  PCP: Erick Greig LABOR, NP  Admit date: 12/11/2023 Discharge date: 12/13/2023  Admitted From: Home Disposition:  Home  Discharge Condition:Stable CODE STATUS:FULL Diet recommendation:  Regular  Brief/Interim Summary: Patient is a 63 year old female total/PMH of asthma, anxiety, panic attacks, paroxysmal tachycardia, rheumatoid arthritis, fibromyalgia, GERD, IBS, history of seizures/pseudoseizures who was admitted for from emergency room for episode of unresponsiveness. Her workup with CT scan of the brain, MRI have been negative.  Neurology and psychiatry evaluated the patient.  It appears that she has possible pseudoseizures.  Patient stated that she had those kind of incident in the past as well.  Currently she is hemodynamically stable.  PT /OT recommended SNF on discharge but she declined and wants to go home with home health.  She is fully alert and oriented today.  She feels ready to go home today  Following problems were addressed during the hospitalization:  Episode of unresponsiveness/catatonia - Unclear etiology - Workup have been negative so far with negative MRI and EEG and CT scan of the brain. - Neurology and psychiatry have seen the patient - Psychiatrist advised no need for treatment for catatonia except treatment for anxiety she is already on - Patient attributes this to sleep deprivation which she had in the past.   2. Abdominal pain Patient states chronic and recurrent ever since her hysterectomy. CT scan of abdomen pelvis insignificant.  Abdomen is soft, nondistended, good bowel sounds   3.  Anemia, unspecified Currently stable   4. Anxiety History of severe anxiety patient reports she takes Xanax .She would benefit from outpatient psychiatry evaluation and management   5. GERD  Patient states cannot tolerate PPI ordered Mylanta   6. IBS  Followed outpatient by GI   7.  Rheumatoid arthritis May be contributing to some of his symptoms will need to continue follow-up as an outpatient      Discharge Diagnoses:  Principal Problem:   Unresponsive episode Active Problems:   Anemia, unspecified   Anxiety   GERD (gastroesophageal reflux disease)   IBS (irritable bowel syndrome)   Rheumatoid arthritis (HCC)   Abdominal pain   Weight loss   Mixed hyperlipidemia   Hyponatremia   Abnormal EKG    Discharge Instructions  Discharge Instructions     Ambulatory referral to Neurology   Complete by: As directed    Follow up with Dr. Skeet at [facility] in 4-6 weeks. Pt is Dr. Jayme pt. Thanks.   Diet general   Complete by: As directed    Discharge instructions   Complete by: As directed    1)Please take your medications as instructed 2)Follow up with your PCP in a week   Increase activity slowly   Complete by: As directed       Allergies as of 12/13/2023       Reactions   Actiq [fentanyl] Other (See Comments)   Unknown reaction   Haldol  [haloperidol ] Other (See Comments)   Unknown reaction   Kenalog [triamcinolone ] Other (See Comments)   Stomach cramps   Proton Pump Inhibitors Other (See Comments)   Unknown reaction   Diprivan [propofol] Anxiety, Other (See Comments)   Hallucinations  OK low dose only        Medication List     STOP taking these medications    LORazepam  0.5 MG tablet Commonly known as: ATIVAN        TAKE these medications    ALPRAZolam  1 MG tablet  Commonly known as: XANAX  Take 0.5-1 mg by mouth See admin instructions. Take 1 tablet (1mg ) by mouth at 0500, take 1/2 tablet (0.5mg ) at 1000, take 1 tablet (1mg ) at 1300, take 1/2 tablet (0.5mg ) at 1700, and take 1 tablet (1mg ) at 2100.   bisacodyl  10 MG suppository Commonly known as: DULCOLAX Place 1 suppository (10 mg total) rectally daily as needed for moderate constipation.   dicyclomine  20 MG tablet Commonly known as: BENTYL  Take 1 tablet (20 mg total) by mouth  2 (two) times daily as needed for spasms. Please break into 1/4 What changed: Another medication with the same name was removed. Continue taking this medication, and follow the directions you see here.   lidocaine  2 % solution Commonly known as: XYLOCAINE  Use as directed 15 mLs in the mouth or throat See admin instructions. Self compound 1:1 with Mylanta/Maalox, take 15mL every 8 hours as needed for gastritis.   mirtazapine 7.5 MG tablet Commonly known as: REMERON Take 7.5 mg by mouth at bedtime.   ondansetron  4 MG disintegrating tablet Commonly known as: ZOFRAN -ODT Take 1 tablet (4 mg total) by mouth every 8 (eight) hours as needed.   pantoprazole  20 MG tablet Commonly known as: Protonix  Take 1 tablet (20 mg total) by mouth daily. What changed:  when to take this reasons to take this               Durable Medical Equipment  (From admission, onward)           Start     Ordered   12/13/23 1028  For home use only DME Walker rolling  Once       Question Answer Comment  Walker: With 5 Inch Wheels   Patient needs a walker to treat with the following condition Physical deconditioning   Patient needs a walker to treat with the following condition Catatonia      12/13/23 1028            Follow-up Information     Skeet Cornet R, DO. Schedule an appointment as soon as possible for a visit in 1 month(s).   Specialty: Neurology Contact information: [address] [place] [address] [phone]         [facility] Follow up.   Why: [employer] will provide a RW to the bedside. Contact information: [address] [place] [address] [phone]         [facility] Follow up.   Specialty: Home Health Services Why: [facility] will provide home health services.  They will call you in the next 24-48 hours to set up services. Contact information: [address] [place] [address] [phone]          Erick Greig LABOR, NP. Schedule an appointment as soon as possible for a visit in 1 week(s).   Specialty: Internal Medicine Contact information: [address] [place] [address] [phone]                Allergies  Allergen Reactions   Actiq [Fentanyl] Other (See Comments)    Unknown reaction   Haldol  [Haloperidol ] Other (See Comments)    Unknown reaction   Kenalog [Triamcinolone ] Other (See Comments)    Stomach cramps   Proton Pump Inhibitors Other (See Comments)    Unknown reaction   Diprivan [Propofol] Anxiety and Other (See Comments)    Hallucinations   OK low dose only    Consultations:  Procedures/Studies: ECHOCARDIOGRAM COMPLETE Result Date: 12/12/2023    ECHOCARDIOGRAM REPORT   Patient Name:   YERLIN GASPARYAN Myron Date of Exam: 12/12/2023 Medical Rec #:  [id number]            Height:       56.0 in Accession #:    [id number]           Weight:       76.7 lb Date of Birth:  06-10-1960            BSA:          1.181 m Patient Age:    63 years             BP:           143/85 mmHg Patient Gender: F                    HR:           105 bpm. Exam Location:  Inpatient Procedure: 2D Echo, Cardiac Doppler and Color Doppler (Both Spectral and Color            Flow Doppler were utilized during procedure). Indications:    Stroke  History:        Patient has prior history of Echocardiogram examinations, most                 recent 03/04/2021.  Sonographer:    Benard Stallion Referring Phys: [id number] ANASTASSIA DOUTOVA IMPRESSIONS  1. Left ventricular ejection fraction, by estimation, is 55 to 60%. The left ventricle has normal function. Left ventricular endocardial border not optimally defined to evaluate regional wall motion. Indeterminate diastolic filling due to E-A fusion.  2. Right ventricular systolic function is normal. The right ventricular size is normal. Tricuspid regurgitation signal is inadequate for assessing PA pressure.  3. The mitral valve is grossly  normal. No evidence of mitral valve regurgitation. No evidence of mitral stenosis.  4. The aortic valve was not well visualized. Aortic valve regurgitation is not visualized. No aortic stenosis is present. Conclusion(s)/Recommendation(s): No intracardiac source of embolism detected on this transthoracic study. Consider a transesophageal echocardiogram to exclude cardiac source of embolism if clinically indicated. Technically poor quality transthoracic images. FINDINGS  Left Ventricle: Left ventricular ejection fraction, by estimation, is 55 to 60%. The left ventricle has normal function. Left ventricular endocardial border not optimally defined to evaluate regional wall motion. The left ventricular internal cavity size was normal in size. There is no left ventricular hypertrophy. Indeterminate diastolic filling due to E-A fusion. Right Ventricle: The right ventricular size is normal. Right vetricular wall thickness was not well visualized. Right ventricular systolic function is normal. Tricuspid regurgitation signal is inadequate for assessing PA pressure. Left Atrium: Left atrial size was not well visualized. Right Atrium: Right atrial size was not well visualized. Pericardium: There is no evidence of pericardial effusion. Mitral Valve: The mitral valve is grossly normal. No evidence of mitral valve regurgitation. No evidence of mitral valve stenosis. Tricuspid Valve: The tricuspid valve is not well visualized. Aortic Valve: The aortic valve was not well visualized. Aortic valve regurgitation is not visualized. No aortic stenosis is present. Aortic valve mean gradient measures 2.0 mmHg. Aortic valve peak gradient measures 3.7 mmHg. Aortic valve area, by VTI measures 1.90 cm. Pulmonic Valve: The pulmonic valve was not well visualized. Aorta: The aortic root was not well visualized.  LEFT VENTRICLE PLAX 2D LVIDd:         2.30 cm  Diastology LVIDs:         1.60 cm   LV e' medial:    7.40 cm/s LV PW:         0.80 cm    LV E/e' medial:  8.2 LV IVS:        0.80 cm   LV e' lateral:   7.62 cm/s LVOT diam:     1.70 cm   LV E/e' lateral: 7.9 LV SV:         27 LV SV Index:   23 LVOT Area:     2.27 cm  RIGHT VENTRICLE RV S prime:     6.96 cm/s LEFT ATRIUM           Index LA Vol (A2C): 14.4 ml 12.19 ml/m LA Vol (A4C): 9.5 ml  8.05 ml/m  AORTIC VALVE AV Area (Vmax):    1.81 cm AV Area (Vmean):   1.78 cm AV Area (VTI):     1.90 cm AV Vmax:           96.40 cm/s AV Vmean:          65.200 cm/s AV VTI:            0.143 m AV Peak Grad:      3.7 mmHg AV Mean Grad:      2.0 mmHg LVOT Vmax:         76.70 cm/s LVOT Vmean:        51.000 cm/s LVOT VTI:          0.120 m LVOT/AV VTI ratio: 0.84 MITRAL VALVE MV Area (PHT): 4.71 cm    SHUNTS MV Decel Time: 161 msec    Systemic VTI:  0.12 m MV E velocity: 60.40 cm/s  Systemic Diam: 1.70 cm MV A velocity: 79.30 cm/s MV E/A ratio:  0.76 Mihai Croitoru MD Electronically signed by Jerel Balding MD Signature Date/Time: 12/12/2023/2:42:56 PM    Final    EEG adult Result Date: 12/12/2023 Shelton Arlin KIDD, MD     12/12/2023 10:27 AM Patient Name: Ashlyne Olenick MRN: [id number] Epilepsy Attending: Arlin KIDD Shelton Referring Physician/Provider: Jerri Pfeiffer, MD Date: 12/12/2023 Duration: 22.23 mins Patient history: 63 y.o. female with PMH of severe anxiety with panic attacks, fibromyalgia, IBS, pseudoseizure, tension headache presented to ED for acute onset mental status change, staring off, not answer questions, not talking, but able to track bilaterally, inconsistently blinking to visual threat bilaterally, moving bilateral extremities seems symmetrical. EEG to evaluate for seizure Level of alertness: Awake, asleep AEDs during EEG study: Xanax  Technical aspects: This EEG study was done with scalp electrodes positioned according to the 10-20 International system of electrode placement. Electrical activity was reviewed with band pass filter of 1-70Hz , sensitivity of 7 uV/mm, display speed of 71mm/sec  with a 60Hz  notched filter applied as appropriate. EEG data were recorded continuously and digitally stored.  Video monitoring was available and reviewed as appropriate. Description: The posterior dominant rhythm consists of 9-10 Hz activity of moderate voltage (25-35 uV) seen predominantly in posterior head regions, symmetric and reactive to eye opening and eye closing. Hyperventilation and photic stimulation were not performed.   IMPRESSION: This study is within normal limits. No seizures or epileptiform discharges were seen throughout the recording. A normal interictal EEG does not exclude the diagnosis of epilepsy. Arlin KIDD Shelton   CT ABDOMEN PELVIS W CONTRAST Result Date: 12/12/2023 EXAM: CT ABDOMEN AND PELVIS WITH CONTRAST 12/12/2023 12:40:52 AM TECHNIQUE: CT of the abdomen and pelvis was performed  with the administration of intravenous contrast. Multiplanar reformatted images are provided for review. Automated exposure control, iterative reconstruction, and/or weight based adjustment of the mA/kV was utilized to reduce the radiation dose to as low as reasonably achievable. COMPARISON: 11/17/2023 CLINICAL HISTORY: Abdominal pain, acute, nonlocalized. FINDINGS: LOWER CHEST: No acute abnormality. LIVER: The liver is unremarkable. GALLBLADDER AND BILE DUCTS: Gallbladder is unremarkable. No biliary ductal dilatation. SPLEEN: No acute abnormality. PANCREAS: No acute abnormality. ADRENAL GLANDS: No acute abnormality. KIDNEYS, URETERS AND BLADDER: 9 mm simple right lower pole renal cyst (image 22), benign (Bosniak 1). No follow up is recommended. No stones in the kidneys or ureters. No hydronephrosis. No perinephric or periureteral stranding. Excretory contrast in the bladder. GI AND BOWEL: Appendix is not discretely visualized. Stomach demonstrates no acute abnormality. There is no bowel obstruction. No bowel wall thickening. PERITONEUM AND RETROPERITONEUM: No ascites. No free air. VASCULATURE: Aorta is normal  in caliber. LYMPH NODES: No lymphadenopathy. REPRODUCTIVE ORGANS: No acute abnormality. BONES AND SOFT TISSUES: Mild degenerative changes at L5-S1. No acute osseous abnormality. No focal soft tissue abnormality. IMPRESSION: 1. No acute findings in the abdomen or pelvis. Electronically signed by: Pinkie Pebbles MD 12/12/2023 12:51 AM EDT RP Workstation: [id number]   MR BRAIN WO CONTRAST Result Date: 12/12/2023 EXAM: MRI BRAIN WITHOUT CONTRAST 12/12/2023 12:09:27 AM TECHNIQUE: Multiplanar multisequence MRI of the head/brain was performed without the administration of intravenous contrast. COMPARISON: None available. CLINICAL HISTORY: Neuro deficit, acute, stroke suspected. FINDINGS: BRAIN AND VENTRICLES: No acute infarct. No intracranial hemorrhage. No mass. No midline shift. No hydrocephalus. The sella is unremarkable. Normal flow voids. Minimal nonspecific white matter T2-weighted signal hyperintensities, which may be associated with early chronic small vessel disease or migraine headaches. ORBITS: No acute abnormality. SINUSES AND MASTOIDS: No acute abnormality. BONES AND SOFT TISSUES: Normal marrow signal. No acute soft tissue abnormality. IMPRESSION: 1. No acute intracranial abnormality. 2. Minimal nonspecific white matter T2-weighted signal hyperintensities, possibly related to early chronic small vessel disease or migraine headaches. Electronically signed by: Franky Stanford MD 12/12/2023 12:21 AM EDT RP Workstation: [id number]   DG Chest Portable 1 View Result Date: 12/11/2023 CLINICAL DATA:  Code stroke cp cough EXAM: PORTABLE CHEST 1 VIEW COMPARISON:  Chest x-ray 10/29/2021 FINDINGS: The heart and mediastinal contours are within normal limits. 2 cm nodularity overlying the lower lobes consistent with nipple shadows. No focal consolidation. No pulmonary edema. No pleural effusion. No pneumothorax. No acute osseous abnormality. IMPRESSION: No active disease. Electronically Signed   By: Morgane  Naveau  M.D.   On: 12/11/2023 19:40   CT ANGIO HEAD NECK W WO CM W PERF (CODE STROKE) Result Date: 12/11/2023 CLINICAL DATA:  Provided history: Neuro deficit, acute, stroke suspected. EXAM: CT ANGIOGRAPHY HEAD AND NECK CT PERFUSION BRAIN TECHNIQUE: Multidetector CT imaging of the head and neck was performed using the standard protocol during bolus administration of intravenous contrast. Multiplanar CT image reconstructions and MIPs were obtained to evaluate the vascular anatomy. Carotid stenosis measurements (when applicable) are obtained utilizing NASCET criteria, using the distal internal carotid diameter as the denominator. Multiphase CT imaging of the brain was performed following IV bolus contrast injection. Subsequent parametric perfusion maps were calculated using RAPID software. RADIATION DOSE REDUCTION: This exam was performed according to the departmental dose-optimization program which includes automated exposure control, adjustment of the mA and/or kV according to patient size and/or use of iterative reconstruction technique. CONTRAST:  Administered contrast not known at this time. COMPARISON:  Non-contrast head CT performed earlier today 12/11/2023. FINDINGS:  CTA NECK FINDINGS Aortic arch: Common origin of the innominate and left common carotid arteries. Mild despite plaque within the proximal right subclavian artery. No hemodynamically significant innominate or proximal subclavian artery stenosis. Right carotid system: CCA and ICA patent within the neck without stenosis or significant atherosclerotic disease. Left carotid system: CCA and ICA patent within the neck without stenosis or significant atherosclerotic disease. Vertebral arteries: Codominant and patent within the neck without stenosis or significant atherosclerotic disease. Skeleton: Nonspecific reversal of the expected cervical lordosis. Cervical spondylosis. No acute fracture or aggressive osseous lesion. Other neck: No neck mass or cervical  lymphadenopathy Upper chest: No consolidation within the imaged lung apices. Biapical pleuroparenchymal scarring. Review of the MIP images confirms the above findings CTA HEAD FINDINGS Anterior circulation: The intracranial internal carotid arteries are patent. Atherosclerotic plaque within both vessels. Most notably, there is up to moderate stenosis of the cavernous right ICA. The M1 middle cerebral arteries are patent. No M2 proximal branch occlusion or high-grade proximal stenosis. The anterior cerebral arteries are patent. Hypoplastic right A1 segment. No intracranial aneurysm is identified. Posterior circulation: The intracranial vertebral arteries are patent. The basilar artery is patent. The posterior cerebral arteries are patent. Posterior communicating arteries are present bilaterally. Venous sinuses: Within the limitations of contrast timing, no convincing thrombus. Anatomic variants: As described. Review of the MIP images confirms the above findings CT Brain Perfusion Findings: CBF (<30%) Volume: 0mL Perfusion (Tmax>6.0s) volume: 0mL Mismatch Volume: 0mL Infarction Location: None identified. IMPRESSION: CTA neck: The common carotid, internal carotid and vertebral arteries are patent within the neck without stenosis or significant atherosclerotic disease. CTA head: No proximal intracranial large vessel occlusion or high-grade proximal arterial stenosis identified. CT perfusion head: The perfusion software identifies no core infarct. The perfusion software identifies no critically hypoperfused parenchyma (utilizing the Tmax>6 seconds threshold). No mismatch volume reported. Electronically Signed   By: Rockey Childs D.O.   On: 12/11/2023 17:59   CT HEAD CODE STROKE WO CONTRAST Result Date: 12/11/2023 CLINICAL DATA:  Code stroke. Neuro deficit, acute, stroke suspected. EXAM: CT HEAD WITHOUT CONTRAST TECHNIQUE: Contiguous axial images were obtained from the base of the skull through the vertex without  intravenous contrast. RADIATION DOSE REDUCTION: This exam was performed according to the departmental dose-optimization program which includes automated exposure control, adjustment of the mA and/or kV according to patient size and/or use of iterative reconstruction technique. COMPARISON:  None. FINDINGS: Brain: Mild generalized cerebral atrophy. There is no acute intracranial hemorrhage. No demarcated cortical infarct. No extra-axial fluid collection. No evidence of an intracranial mass. No midline shift. Vascular: No hyperdense vessel.  Atherosclerotic calcifications. Skull: No calvarial fracture or aggressive osseous lesion. Sinuses/Orbits: No mass or acute finding within the imaged orbits. No significant paranasal sinus disease at the imaged levels. ASPECTS [facility] Stroke Program Early CT Score) - Ganglionic level infarction (caudate, lentiform nuclei, internal capsule, insula, M1-M3 cortex): 7 - Supraganglionic infarction (M4-M6 cortex): 3 Total score (0-10 with 10 being normal): 10 No evidence of an acute intracranial abnormality. These results were communicated to Dr. Jerri At 5:39 pmon 7/20/2025by text page via the [facility] messaging system. IMPRESSION: 1. No evidence of an acute intracranial abnormality. 2. Mild generalized cerebral atrophy. Electronically Signed   By: Rockey Childs D.O.   On: 12/11/2023 17:40   CT CHEST ABDOMEN PELVIS W CONTRAST Result Date: 11/17/2023 CLINICAL DATA:  Sepsis. Chest heaviness. Generalized abdominal pain. EXAM: CT CHEST, ABDOMEN, AND PELVIS WITH CONTRAST TECHNIQUE: Multidetector CT imaging of the chest, abdomen  and pelvis was performed following the standard protocol during bolus administration of intravenous contrast. RADIATION DOSE REDUCTION: This exam was performed according to the departmental dose-optimization program which includes automated exposure control, adjustment of the mA and/or kV according to patient size and/or use of iterative reconstruction technique.  CONTRAST:  80mL OMNIPAQUE  IOHEXOL  300 MG/ML  SOLN COMPARISON:  CT scan abdomen and pelvis from 10/29/2021. FINDINGS: CT CHEST FINDINGS Cardiovascular: Normal cardiac size. No pericardial effusion. No aortic aneurysm. Mediastinum/Nodes: Visualized thyroid  gland appears grossly unremarkable. No solid / cystic mediastinal masses. The esophagus is nondistended precluding optimal assessment. No axillary, mediastinal or hilar lymphadenopathy by size criteria. Lungs/Pleura: The central tracheo-bronchial tree is patent. There are patchy areas of linear, plate-like atelectasis and/or scarring throughout bilateral lungs. There is biapical pleuroparenchymal disease, right more than left. No mass or consolidation. No pleural effusion or pneumothorax. No suspicious lung nodules. Musculoskeletal: The visualized soft tissues of the chest wall are grossly unremarkable. No suspicious osseous lesions. CT ABDOMEN PELVIS FINDINGS Hepatobiliary: The liver is normal in size. Non-cirrhotic configuration. No suspicious mass. These is mild diffuse hepatic steatosis. No intrahepatic or extrahepatic bile duct dilation. No calcified gallstones. Normal gallbladder wall thickness. No pericholecystic inflammatory changes. Pancreas: Unremarkable. No pancreatic ductal dilatation or surrounding inflammatory changes. Spleen: Within normal limits. No focal lesion. Adrenals/Urinary Tract: Adrenal glands are unremarkable. No suspicious renal mass. There is a 8 x 9 mm simple cyst in the right kidney interpolar region, laterally. No nephroureterolithiasis or obstructive uropathy on either side. Unremarkable urinary bladder. Stomach/Bowel: No disproportionate dilation of the small or large bowel loops. No evidence of abnormal bowel wall thickening or inflammatory changes. The appendix is unremarkable. Vascular/Lymphatic: No ascites or pneumoperitoneum. No abdominal or pelvic lymphadenopathy, by size criteria. No aneurysmal dilation of the major abdominal  arteries. Reproductive: The uterus is surgically absent. No large adnexal mass. Other: The visualized soft tissues and abdominal wall are unremarkable. Musculoskeletal: No suspicious osseous lesions. IMPRESSION: 1. No acute inflammatory process identified within the chest, abdomen or pelvis. 2. No lung mass, consolidation, pleural effusion or pneumothorax. No bowel obstruction. 3. Multiple other nonacute observations, as described above. Electronically Signed   By: Ree Molt M.D.   On: 11/17/2023 17:07      Subjective: Patient seen and examined at bedside today.  Hemodynamically stable.  Comfortable.  Ate her lunch.  Denies any new complaints today.  Alert and oriented.  Feels ready to go home.  Discharge Exam: Vitals:   12/13/23 0450 12/13/23 0757  BP: 111/76 111/70  Pulse: 79 97  Resp: 18 16  Temp: 97.9 F (36.6 C) 97.8 F (36.6 C)  SpO2: 100% 100%   Vitals:   12/12/23 2028 12/13/23 0011 12/13/23 0450 12/13/23 0757  BP: 100/77 90/63 111/76 111/70  Pulse: 99 93 79 97  Resp: 18 18 18 16   Temp: 97.9 F (36.6 C) (!) 97.4 F (36.3 C) 97.9 F (36.6 C) 97.8 F (36.6 C)  TempSrc: Oral     SpO2: 99% 98% 100% 100%  Weight:      Height:        General: Pt is alert, awake, not in acute distress Cardiovascular: RRR, S1/S2 +, no rubs, no gallops Respiratory: CTA bilaterally, no wheezing, no rhonchi Abdominal: Soft, NT, ND, bowel sounds + Extremities: no edema, no cyanosis    The results of significant diagnostics from this hospitalization (including imaging, microbiology, ancillary and laboratory) are listed below for reference.     Microbiology: Recent Results (from the past  240 hours)  Resp panel by RT-PCR (RSV, Flu A&B, Covid) Anterior Nasal Swab     Status: None   Collection Time: 12/11/23  6:39 PM   Specimen: Anterior Nasal Swab  Result Value Ref Range Status   SARS Coronavirus 2 by RT PCR NEGATIVE NEGATIVE Final   Influenza A by PCR NEGATIVE NEGATIVE Final    Influenza B by PCR NEGATIVE NEGATIVE Final    Comment: (NOTE) The Xpert Xpress SARS-CoV-2/FLU/RSV plus assay is intended as an aid in the diagnosis of influenza from Nasopharyngeal swab specimens and should not be used as a sole basis for treatment. Nasal washings and aspirates are unacceptable for Xpert Xpress SARS-CoV-2/FLU/RSV testing.  Fact Sheet for Patients: BloggerCourse.com  Fact Sheet for Healthcare Providers: SeriousBroker.it  This test is not yet approved or cleared by the [place]  FDA and has been authorized for detection and/or diagnosis of SARS-CoV-2 by FDA under an Emergency Use Authorization (EUA). This EUA will remain in effect (meaning this test can be used) for the duration of the COVID-19 declaration under Section 564(b)(1) of the Act, 21 U.S.C. section 360bbb-3(b)(1), unless the authorization is terminated or revoked.     Resp Syncytial Virus by PCR NEGATIVE NEGATIVE Final    Comment: (NOTE) Fact Sheet for Patients: BloggerCourse.com  Fact Sheet for Healthcare Providers: SeriousBroker.it  This test is not yet approved or cleared by the [place]  FDA and has been authorized for detection and/or diagnosis of SARS-CoV-2 by FDA under an Emergency Use Authorization (EUA). This EUA will remain in effect (meaning this test can be used) for the duration of the COVID-19 declaration under Section 564(b)(1) of the Act, 21 U.S.C. section 360bbb-3(b)(1), unless the authorization is terminated or revoked.  Performed at [facility], [address] [place], [place] [address]      Labs: BNP (last 3 results) No results for input(s): BNP in the last 8760 hours. Basic Metabolic Panel: Recent Labs  Lab 12/11/23 2117 12/12/23 0430 12/12/23 0935 12/12/23 1517 12/12/23 2136 12/13/23 0318 12/13/23 1017  NA  --    < > 131* 127* 127* 129* 133*  K  --     < > 4.1 3.7 3.6 3.9 4.0  CL  --    < > 96* 93* 93* 96* 97*  CO2  --    < > 24 25 26 26 24   GLUCOSE  --    < > 83 128* 97 87 89  BUN  --    < > <5* 5* 12 14 8   CREATININE  --    < > 0.62 0.64 0.73 0.69 0.68  CALCIUM  --    < > 8.8* 9.0 8.6* 8.6* 8.9  MG 2.2  --   --   --   --   --   --   PHOS 2.5  --   --   --   --   --   --    < > = values in this interval not displayed.   Liver Function Tests: Recent Labs  Lab 12/11/23 1721 12/11/23 2117  AST 25 22  ALT 23 20  ALKPHOS 61 59  BILITOT 0.6 0.5  PROT 7.3 6.5  ALBUMIN 3.8 3.3*   No results for input(s): LIPASE, AMYLASE in the last 168 hours. Recent Labs  Lab 12/11/23 2117  AMMONIA 25   CBC: Recent Labs  Lab 12/11/23 1721 12/11/23 1723 12/11/23 2117  WBC 5.9  --  5.3  NEUTROABS 3.6  --  3.2  HGB 12.7 13.6 12.0  HCT 37.9 40.0 35.6*  MCV 90.2  --  88.6  PLT 291  --  283   Cardiac Enzymes: Recent Labs  Lab 12/11/23 2117  CKTOTAL 65   BNP: Invalid input(s): POCBNP CBG: No results for input(s): GLUCAP in the last 168 hours. D-Dimer No results for input(s): DDIMER in the last 72 hours. Hgb A1c Recent Labs    12/11/23 2117  HGBA1C 5.1   Lipid Profile Recent Labs    12/12/23 0430  CHOL 157  HDL 66  LDLCALC 86  TRIG 26  CHOLHDL 2.4   Thyroid  function studies Recent Labs    12/11/23 1721  TSH 0.994   Anemia work up No results for input(s): VITAMINB12, FOLATE, FERRITIN, TIBC, IRON, RETICCTPCT in the last 72 hours. Urinalysis    Component Value Date/Time   COLORURINE STRAW (A) 12/11/2023 2225   APPEARANCEUR CLEAR 12/11/2023 2225   LABSPEC 1.034 (H) 12/11/2023 2225   PHURINE 7.0 12/11/2023 2225   GLUCOSEU NEGATIVE 12/11/2023 2225   HGBUR NEGATIVE 12/11/2023 2225   BILIRUBINUR NEGATIVE 12/11/2023 2225   KETONESUR NEGATIVE 12/11/2023 2225   PROTEINUR NEGATIVE 12/11/2023 2225   NITRITE NEGATIVE 12/11/2023 2225   LEUKOCYTESUR MODERATE (A) 12/11/2023 2225   Sepsis  Labs Recent Labs  Lab 12/11/23 1721 12/11/23 2117  WBC 5.9 5.3   Microbiology Recent Results (from the past 240 hours)  Resp panel by RT-PCR (RSV, Flu A&B, Covid) Anterior Nasal Swab     Status: None   Collection Time: 12/11/23  6:39 PM   Specimen: Anterior Nasal Swab  Result Value Ref Range Status   SARS Coronavirus 2 by RT PCR NEGATIVE NEGATIVE Final   Influenza A by PCR NEGATIVE NEGATIVE Final   Influenza B by PCR NEGATIVE NEGATIVE Final    Comment: (NOTE) The Xpert Xpress SARS-CoV-2/FLU/RSV plus assay is intended as an aid in the diagnosis of influenza from Nasopharyngeal swab specimens and should not be used as a sole basis for treatment. Nasal washings and aspirates are unacceptable for Xpert Xpress SARS-CoV-2/FLU/RSV testing.  Fact Sheet for Patients: BloggerCourse.com  Fact Sheet for Healthcare Providers: SeriousBroker.it  This test is not yet approved or cleared by the [place]  FDA and has been authorized for detection and/or diagnosis of SARS-CoV-2 by FDA under an Emergency Use Authorization (EUA). This EUA will remain in effect (meaning this test can be used) for the duration of the COVID-19 declaration under Section 564(b)(1) of the Act, 21 U.S.C. section 360bbb-3(b)(1), unless the authorization is terminated or revoked.     Resp Syncytial Virus by PCR NEGATIVE NEGATIVE Final    Comment: (NOTE) Fact Sheet for Patients: BloggerCourse.com  Fact Sheet for Healthcare Providers: SeriousBroker.it  This test is not yet approved or cleared by the [place]  FDA and has been authorized for detection and/or diagnosis of SARS-CoV-2 by FDA under an Emergency Use Authorization (EUA). This EUA will remain in effect (meaning this test can be used) for the duration of the COVID-19 declaration under Section 564(b)(1) of the Act, 21 U.S.C. section 360bbb-3(b)(1),  unless the authorization is terminated or revoked.  Performed at [facility], [address] [place], [place] [address]     Please note: You were cared for by a hospitalist during your hospital stay. Once you are discharged, your primary care physician will handle any further medical issues. Please note that NO REFILLS for any discharge medications will be authorized once you are discharged, as it is imperative  that you return to your primary care physician (or establish a relationship with a primary care physician if you do not have one) for your post hospital discharge needs so that they can reassess your need for medications and monitor your lab values.    Time coordinating discharge: 40 minutes  SIGNED:   Ivonne Mustache, MD  [facility] 12/13/2023, 1:36 PM Pager [phone]  If 7PM-7AM, please contact night-coverage [email or web address] Password [email or web address]

## 2023-12-14 LAB — VITAMIN B1: Vitamin B1 (Thiamine): 142.5 nmol/L (ref 66.5–200.0)

## 2023-12-14 NOTE — Telephone Encounter (Signed)
 Long discussion over phone.  Had lot of stress and sleep deprivation d/t personal problems (with children) Neg CT Chest/abdo/pelvis except for constipation.  Excerberated IBS with abdo pain Given miralax  at Hospital - with good results.  Extensive neg GI eval  Plan: -Continue psychologist counseling -Continue miralax  prn. -Continue pepcid  prn. -Needs pelvic PT for pelvic floor dysnergia. She doesnot want to FU with  uro-gyn. She will do these on her own.  RG

## 2023-12-15 NOTE — Telephone Encounter (Signed)
 Noted

## 2023-12-23 ENCOUNTER — Telehealth: Payer: Self-pay | Admitting: Gastroenterology

## 2023-12-23 NOTE — Telephone Encounter (Signed)
 Returned call to patient & she has complaints of reflux and tingling sensation in upper abdomen throughout the day and will occasionally wake her up at night. HOB is elevated at night. She is unable to tolerate PPI's. She is taking pepcid  complete one pill daily in the morning & gi cocktail as needed. Advised her she could increase pepcid  as directed to two tablets daily (one in am, one in pm) as tolerated, continue small/frequent meals, avoid spicy foods and eating late at night. Pt verbalized all understanding.

## 2023-12-23 NOTE — Telephone Encounter (Signed)
 Inbound call from patient stating that she is experiencing acid reflux all day and waking her up during the night. Patient states she is having to sleep up right as well. She states that she has a tingling sensation behind her breast bone. Patient is requesting a call back to discuss.  Please advise.

## 2024-01-03 ENCOUNTER — Encounter: Payer: Self-pay | Admitting: Neurology

## 2024-03-08 ENCOUNTER — Ambulatory Visit: Admitting: Neurology

## 2024-03-19 ENCOUNTER — Telehealth: Payer: Self-pay | Admitting: Gastroenterology

## 2024-03-19 NOTE — Telephone Encounter (Signed)
 Inbound call from patient requesting to speak to the nurse in regards to her doing some research and believes she has short bowel syndrome. Patient is stating that everything she has is read is associates with her symptoms. Patient is requesting a call back. Patient has had me on the phone for 18 minutes. Please advise.

## 2024-03-26 ENCOUNTER — Encounter: Payer: Self-pay | Admitting: Gastroenterology

## 2024-03-26 ENCOUNTER — Telehealth: Admitting: Gastroenterology

## 2024-03-26 DIAGNOSIS — K581 Irritable bowel syndrome with constipation: Secondary | ICD-10-CM | POA: Diagnosis not present

## 2024-03-26 DIAGNOSIS — K219 Gastro-esophageal reflux disease without esophagitis: Secondary | ICD-10-CM | POA: Diagnosis not present

## 2024-03-26 DIAGNOSIS — R102 Pelvic and perineal pain unspecified side: Secondary | ICD-10-CM

## 2024-03-26 DIAGNOSIS — F4310 Post-traumatic stress disorder, unspecified: Secondary | ICD-10-CM

## 2024-03-26 DIAGNOSIS — F413 Other mixed anxiety disorders: Secondary | ICD-10-CM

## 2024-03-26 DIAGNOSIS — F410 Panic disorder [episodic paroxysmal anxiety] without agoraphobia: Secondary | ICD-10-CM

## 2024-03-26 DIAGNOSIS — R109 Unspecified abdominal pain: Secondary | ICD-10-CM

## 2024-03-26 NOTE — Progress Notes (Signed)
 Chief Complaint: FU   Referring Provider:  Dr Keren        ASSESSMENT AND PLAN;    #1. IBS-C. H/O fecal impaction s/p manual disimpaction 05/2020.  Subsequent rectal exams neg.  Stool soft heme-neg. Neg stool studies, virtual colon 11/2017. Failed bentyl , amitriptyline. Refuses colon. Neg cologuard 08/2021  #2. Chronic abdo/pelvic pain (since hysterectomy 04/2016). Dx with IC by urology, fibromyalgia/anxiety/depression/panic attacks/conversion disorder/pseudoseizures. Multiple ED visits. Multiple neg CT scans A/P- 10/2023, 10/2021, 07/2021, 03/28/2019, 03/25/2019, 02/14/2019, 12/2016, 07/2017, 12/2018.   Pt convinced that she short gut syndrome- despite of reassurances  #3. 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 Better. Had appt @ [facility]. But wants to hold off.   #4. GERD with neg Ba Swallow 04/2018.  Neg EGD 04/24/2019, 02/2020. Failed carafate , GI coctail, protonix , omeprazole, nexium (caused burning) (can't do PPIs).   #5. SIGNIFICANT ANXIETY/DEPRESSION/PANIC DISORDER/CONVERSION DISORDER/PTSD   Plan:   -Small but frequent meals. -Continue pepcid  PRN -Increase water intake. -No GI intervention planned at this time. -Appt at [facility] for further eval.    Video visit Patient at home Physician-in office Total time 25 minutes Video visit done since patient had no transportation today HPI:     Donna Francis is a 63 y.o. female  With fibromyalgia, anxiety/depression, costochondritis  History of Present Illness  With chronic abdominal pain with bloating. Did chat GPT and research, also talked to her friend who is a nurse-she is convinced that she has short-bowel syndrome.  No definite history of small bowel resection.  Not having any diarrhea  Very concerned about foul-smelling stools without diarrhea.  Has associated fatigue, malnutrition, heartburn, vitamin deficiencies.  She reports experiencing symptoms she believes are consistent with short  bowel syndrome following a laparoscopic hysterectomy, during which she was told there was significant scar tissue and adhesions, and that her small intestine was separated from another organ.  She has undergone multiple diagnostic tests, including three barium swallows and an endoscopy, which did not reveal any abnormalities in her esophagus. Despite these tests, she continues to experience significant gastrointestinal distress.  Would like to be referred to [facility] for another opinion  Her dietary intake is severely restricted; she can only consume a limited range of foods without experiencing severe cramping and pain. Even water is the only beverage she can tolerate without discomfort.  She has a history of fibromyalgia and chondrocalcinosis, which are currently flaring, causing significant pain and difficulty walking. She also reports a history of rheumatoid arthritis, which she believes contributes to her symptoms of heavy, weighted legs.  She is experiencing significant stress due to the recent death of her sister, who passed away from an aortic dissection, and the family is dealing with the aftermath of her loss.   Wt Readings from Last 3 Encounters:  12/12/23 76 lb 11.5 oz (34.8 kg)  12/06/23 72 lb 12 oz (33 kg)  11/22/23 74 lb (33.6 kg)   Her cardiology work-up has been neg.  She has done well with the behavioral therapy.   Daughter with ETOH pancreatitis Sister has IBS at [place].  Now diagnosed with epigastric hernia.   Prev GI WU  CTAP with contrast 11/17/2023 IMPRESSION: 1. No acute inflammatory process identified within the chest, abdomen or pelvis. 2. No lung mass, consolidation, pleural effusion or pneumothorax. No bowel obstruction. 3. Multiple other nonacute observations, as described above.    CT AP with contrast 08/26/2022 at [facility] -No acute abn -Appears to >50% narrowing  Px celiac. SMA, IMA patent. No atherosclerosis   H/O abn LFTs:  LFTs  07/2022: AST 62, ALT  83 08/24/2022: AST 74, ALT 102 08/30/2022: AST 26, ALT 48. Nl CBC 09/01/2022: AST 46, ALT 56 Neg autoimmune hepatitis panel (AMA, ASMA), ANA, iron studies,serum ceruloplasmin, A1AT, celiac screen and GGT.  Not immune to A/B- refuses vaccine  -occ dysphagia. S/P EGD by Dr CHRISTELLA Oct 2021 at [facility] while inpt. Has problems waking up. Thereafter, had dysphagia (with solids and liquids) and odynophagia and abdo pain d/t spasms. Convinced that most of problems are related to recent EGD  -Chronic abd pain with associated nausea but no vomiting, feeling dizzy, weakness, occasional shortness of breath, fatigue, headaches, palpitations, back pain, belching, unexplained weight loss, a lot of gas  -Can only eat chicken, sea food, berries, apple sauce. Pediasure didn't work.  Tolerating small meals.   -S/p EGD 04/24/2019 showing mild gastritis.  Biopsies were negative for H. Pylori.  SB Bx- neg for celiac disease.  Intolerance to multiple PPIs.  -She is under care of Landry Morones -psychologist in [place].  She really likes her.  Situational anxiety is getting under control.  -All her symptoms started after she had a hysterectomy 04/2016.  She has been seen by GYN at [facility].  I have reviewed the notes in care everywhere.  It has been recommended to start pelvic physical therapy.   -Refuses colonoscopy. Neg cologuard 08/2021: neg  Wt Readings from Last 3 Encounters:  12/12/23 76 lb 11.5 oz (34.8 kg)  12/06/23 72 lb 12 oz (33 kg)  11/22/23 74 lb (33.6 kg)   CT AP with contrast 10/2021 IMPRESSION: No acute abnormality identified in the abdomen or pelvis.      Past Medical History:  Diagnosis Date   Anxiety     Colitis     IBS (irritable bowel syndrome)     Varicose veins of bilateral lower extremities with pain             Past Surgical History:  Procedure Laterality Date   ABDOMINAL HYSTERECTOMY   04/2016   CESAREAN SECTION        x 4   COLONOSCOPY   1995    in [place]            Family  History  Problem Relation Age of Onset   Pancreatic cancer Mother        Social History         Tobacco Use   Smoking status: Never Smoker   Smokeless tobacco: Never Used  Substance Use Topics   Alcohol  use: Yes      Comment: minimal   Drug use: No            Current Outpatient Medications  Medication Sig Dispense Refill   ALPRAZolam  (XANAX ) 0.5 MG tablet Take 1 mg by mouth 4 times        Probiotic Product (PROBIOTIC DAILY PO) Take by mouth daily.        No current facility-administered medications for this visit.       No Known Allergies   Review of Systems:  Psychiatric/Behavioral:  Has anxiety or depression       Physical Exam:    There were no vitals filed for this visit.    VIDEO VISIT       Latest Ref Rng & Units 12/11/2023    9:17 PM 12/11/2023    5:23 PM 12/11/2023    5:21 PM  CBC  WBC 4.0 - 10.5 K/uL 5.3   5.9   Hemoglobin 12.0 - 15.0 g/dL 87.9  86.3  87.2   Hematocrit 36.0 - 46.0 % 35.6  40.0  37.9   Platelets 150 - 400 K/uL 283   291       Latest Ref Rng & Units 12/13/2023   10:17 AM 12/13/2023    3:18 AM 12/12/2023    9:36 PM  CMP  Glucose 70 - 99 mg/dL 89  87  97   BUN 8 - 23 mg/dL 8  14  12    Creatinine 0.44 - 1.00 mg/dL 9.31  9.30  9.26   Sodium 135 - 145 mmol/L 133  129  127   Potassium 3.5 - 5.1 mmol/L 4.0  3.9  3.6   Chloride 98 - 111 mmol/L 97  96  93   CO2 22 - 32 mmol/L 24  26  26    Calcium 8.9 - 10.3 mg/dL 8.9  8.6  8.6        Anselm Bring, MD   Cc: Dr Keren

## 2024-03-27 ENCOUNTER — Telehealth: Payer: Self-pay | Admitting: Gastroenterology

## 2024-03-27 NOTE — Telephone Encounter (Signed)
 Inbound call from patient requesting to speak with Lyle. Please advise.

## 2024-03-27 NOTE — Telephone Encounter (Signed)
 Patient said she doesn't want the referral for [facility] said that today has been a day from [place] and that she is tired of living like this. Said that if she is having trouble breathing she wont call for help. Went on to talk about what has been going with her and she followed up with her message about what she sent regarding [facility] and that she wants a phone call back

## 2024-03-28 NOTE — Telephone Encounter (Signed)
 See note 03-26-24        Patient answered the phone. She said that she doesn't want to do anything and that it takes her a while to do simple things. Spend 52 minutes with her on the phone. But she is doing she says

## 2024-03-28 NOTE — Telephone Encounter (Signed)
 The other choice is [facility] or [place]. The chatGPT pic she sent mentioned [facility], [place]- She has family there. Does she want to be ref there I donot think she has short gut syndrome RG

## 2024-03-28 NOTE — Telephone Encounter (Signed)
 Patient says she doesn't want to go to either of those places and would like to talk about alternatives that could help her since it was mentioned at her visit

## 2024-03-30 NOTE — Telephone Encounter (Signed)
 Spoke with patient over 20 minutes discussing current symptoms, her concerns for short bowel syndrome, and overall goal of what she would like from our office. She is home today with complaints r/t fibromyalgia & costochnodritis, which she has already notified PCP office is waiting to hear back. Despite the pain she is in, she has declined ED recommendations. She is going to continue dicyclomine  that she has at home for GI r/t pain. Patient strongly believes she has SBS, despite being reassured by Dr. Charlanne multiple times. She is aware that based on her ongoing symptoms she will need a referral to another academic facility. She declines once again. She does not want to pursue treatment (such as medications, procedures, etc) even if she is diagnosed with SBS. Pt has been advised to call back if she needs further refills, referral, or further questions/concerns that have not been answered today. Pt verbalized all understanding.

## 2024-03-30 NOTE — Telephone Encounter (Addendum)
 [place] that she is so sick. Said she that she in a lot of pain and that her fibromyalgia and her costochondritis is acting up and she is having trouble breathing. Said that she is in too much pain and that she has to force herself out of bed. I told that she needs to got the hospital then and she said no because Dr Charlanne is her doctor and she hasn't been able to see him when she goes to the hospital and she said that she isn't going. Said her stomach has been in pain since June and she doesn't know what is going on and in pain. I told her I don't know what is going on but since its this bad she needs to see someone about this. She asked if she should take bentyl  or Protonix . I told her that she has the medication to take them if she needs them since bentyl  can help with abdominal spasms if that's what she is having. She said that her body isn't allowing her to do the things that she needs to do. She said its her entire body and not just one thing. I asked her if she has reached out to her PCP since her she is complaining about her fibromyalgia and costochondritis and said she wont due to the referral for PT that made her fibromyalgia. She said she hasn't done a GI cocktail in over a month. Does think that with her having the pain it was made a little worse with her sister passing and the stress that it has caused. Said she is having serious abdominal pain and I keep advising her to go the hospital and she doesn't want to go. I told her to reach out to her PCP to let her know about her fibromyalgia and costochondritis and how bad its been and I would call her around noon since I'm in clinic.   Nyla can you see about this?

## 2024-03-30 NOTE — Telephone Encounter (Signed)
 Thanks a lot for your help Pt doesnot have short gut syndrome as she has researched on chat GPT No Bowel resection, no diarrhea.  Still, if she wants to pursue that diagnosis/treatment,  happy to refer her over to a tertiary care center.  She currently declines. RG

## 2024-04-02 NOTE — Telephone Encounter (Signed)
 Very difficult situation. No short gut syndrome. Still, if she wants another opinion, she has to go to 1 of these 3 places-[facility], [facility], [facility] RG

## 2024-04-02 NOTE — Telephone Encounter (Signed)
 See TE 11-4 patient refused to go anywhere for treatment/consultation

## 2024-04-17 NOTE — Telephone Encounter (Signed)
 Patient stated that she isn't going to anymore doctors. Said that she has been going through this pain for 8 years. Said that we sent her two text messages from [facility] saying that she was referred and I told her that those text messages came from [facility] since I referred her there before she said she wasn't going.  Said she can't think properly and had an anxiety attack at the doctors office recently and also with the recently loss of her sister and an incident that happened with her brother. Told her to reach out to a grief counsel and see what else she can do

## 2024-06-15 ENCOUNTER — Telehealth: Payer: Self-pay | Admitting: Gastroenterology

## 2024-06-15 NOTE — Telephone Encounter (Signed)
 Patient reports generalized abdominal pain.  She states that she is in pain all the time and she feels strongly this is Short gut syndrome.  We reviewed that Dr. Charlanne does not feel this is a diagnosis for her, but he did refer her to [facility] for evaluation of her symptoms. Patient is encouraged to take her dicyclomine  as ordered on a routine bases.  Use heating pad and warm baths to try and relax her abdominal muscles and relieve her pain. She reports that no interventions or medications relieve her pain.  We discussed that she could go to the ED for evaluation.She declines this as well.  She says she can't take the dicyclomine  because pills make my stomach worse.  I offered support and reassurance and encouraged her to seek care in the ED if her symptoms worsened.  She thanked me for the call

## 2024-06-15 NOTE — Telephone Encounter (Signed)
 Incoming call from pt regarding current GI symptoms. Pt stated her stomach hurts and [facility] doesn't have appointments until 03/31 @1 :30pm. Pt requesting medical advice. Please advise. Thank you
